# Patient Record
Sex: Male | Born: 1935 | Race: White | Hispanic: No | Smoking: Never smoker
Health system: Southern US, Community
[De-identification: ages and names within clinical notes are randomized; demographics above are authoritative.]

## PROBLEM LIST (undated history)

## (undated) DIAGNOSIS — I4891 Unspecified atrial fibrillation: Secondary | ICD-10-CM

## (undated) DIAGNOSIS — I639 Cerebral infarction, unspecified: Secondary | ICD-10-CM

## (undated) DIAGNOSIS — I671 Cerebral aneurysm, nonruptured: Secondary | ICD-10-CM

## (undated) DIAGNOSIS — I82409 Acute embolism and thrombosis of unspecified deep veins of unspecified lower extremity: Secondary | ICD-10-CM

## (undated) DIAGNOSIS — I739 Peripheral vascular disease, unspecified: Secondary | ICD-10-CM

## (undated) DIAGNOSIS — Z87442 Personal history of urinary calculi: Secondary | ICD-10-CM

## (undated) DIAGNOSIS — T8859XA Other complications of anesthesia, initial encounter: Secondary | ICD-10-CM

## (undated) DIAGNOSIS — I499 Cardiac arrhythmia, unspecified: Secondary | ICD-10-CM

## (undated) DIAGNOSIS — T4145XA Adverse effect of unspecified anesthetic, initial encounter: Secondary | ICD-10-CM

## (undated) DIAGNOSIS — M1990 Unspecified osteoarthritis, unspecified site: Secondary | ICD-10-CM

## (undated) DIAGNOSIS — H9190 Unspecified hearing loss, unspecified ear: Secondary | ICD-10-CM

## (undated) HISTORY — PX: PROSTATECTOMY: SHX69

## (undated) HISTORY — PX: HERNIA REPAIR: SHX51

## (undated) HISTORY — DX: Acute embolism and thrombosis of unspecified deep veins of unspecified lower extremity: I82.409

## (undated) HISTORY — DX: Peripheral vascular disease, unspecified: I73.9

## (undated) HISTORY — PX: ABOVE KNEE LEG AMPUTATION: SUR20

## (undated) HISTORY — PX: TONSILLECTOMY AND ADENOIDECTOMY: SHX28

## (undated) HISTORY — DX: Unspecified osteoarthritis, unspecified site: M19.90

## (undated) HISTORY — PX: CYST EXCISION: SHX5701

## (undated) HISTORY — DX: Unspecified atrial fibrillation: I48.91

## (undated) HISTORY — PX: CYSTOSCOPY: SUR368

## (undated) HISTORY — PX: APPENDECTOMY: SHX54

## (undated) HISTORY — PX: VARICOSE VEIN SURGERY: SHX832

## (undated) HISTORY — DX: Cerebral aneurysm, nonruptured: I67.1

---

## 1998-04-17 ENCOUNTER — Encounter: Payer: Self-pay | Admitting: Emergency Medicine

## 1998-04-17 ENCOUNTER — Emergency Department (HOSPITAL_COMMUNITY): Admission: EM | Admit: 1998-04-17 | Discharge: 1998-04-17 | Payer: Self-pay | Admitting: Emergency Medicine

## 2003-06-24 ENCOUNTER — Ambulatory Visit (HOSPITAL_COMMUNITY): Admission: RE | Admit: 2003-06-24 | Discharge: 2003-06-24 | Payer: Self-pay | Admitting: Family Medicine

## 2003-12-07 ENCOUNTER — Emergency Department (HOSPITAL_COMMUNITY): Admission: EM | Admit: 2003-12-07 | Discharge: 2003-12-07 | Payer: Self-pay | Admitting: Emergency Medicine

## 2004-04-08 ENCOUNTER — Emergency Department (HOSPITAL_COMMUNITY): Admission: EM | Admit: 2004-04-08 | Discharge: 2004-04-08 | Payer: Self-pay | Admitting: Emergency Medicine

## 2006-12-11 ENCOUNTER — Ambulatory Visit: Payer: Self-pay | Admitting: Vascular Surgery

## 2007-04-10 ENCOUNTER — Ambulatory Visit (HOSPITAL_BASED_OUTPATIENT_CLINIC_OR_DEPARTMENT_OTHER): Admission: RE | Admit: 2007-04-10 | Discharge: 2007-04-10 | Payer: Self-pay | Admitting: Urology

## 2008-08-26 ENCOUNTER — Encounter: Admission: RE | Admit: 2008-08-26 | Discharge: 2008-08-26 | Payer: Self-pay | Admitting: Sports Medicine

## 2008-09-08 ENCOUNTER — Ambulatory Visit: Payer: Self-pay | Admitting: Vascular Surgery

## 2008-09-09 ENCOUNTER — Ambulatory Visit: Payer: Self-pay | Admitting: Surgery

## 2008-09-09 ENCOUNTER — Ambulatory Visit (HOSPITAL_COMMUNITY): Admission: RE | Admit: 2008-09-09 | Discharge: 2008-09-09 | Payer: Self-pay | Admitting: Surgery

## 2008-09-15 ENCOUNTER — Ambulatory Visit: Payer: Self-pay | Admitting: Vascular Surgery

## 2008-09-28 ENCOUNTER — Inpatient Hospital Stay (HOSPITAL_COMMUNITY): Admission: RE | Admit: 2008-09-28 | Discharge: 2008-10-05 | Payer: Self-pay | Admitting: Vascular Surgery

## 2008-09-28 ENCOUNTER — Encounter: Payer: Self-pay | Admitting: Vascular Surgery

## 2008-09-29 ENCOUNTER — Ambulatory Visit: Payer: Self-pay | Admitting: Physical Medicine & Rehabilitation

## 2008-10-05 ENCOUNTER — Ambulatory Visit: Payer: Self-pay | Admitting: Physical Medicine & Rehabilitation

## 2008-10-05 ENCOUNTER — Inpatient Hospital Stay (HOSPITAL_COMMUNITY)
Admission: RE | Admit: 2008-10-05 | Discharge: 2008-10-19 | Payer: Self-pay | Admitting: Physical Medicine & Rehabilitation

## 2008-10-27 ENCOUNTER — Ambulatory Visit: Payer: Self-pay | Admitting: Vascular Surgery

## 2008-11-22 ENCOUNTER — Encounter
Admission: RE | Admit: 2008-11-22 | Discharge: 2008-11-30 | Payer: Self-pay | Admitting: Physical Medicine & Rehabilitation

## 2008-11-23 ENCOUNTER — Ambulatory Visit: Payer: Self-pay | Admitting: Physical Medicine & Rehabilitation

## 2009-01-14 ENCOUNTER — Encounter
Admission: RE | Admit: 2009-01-14 | Discharge: 2009-02-10 | Payer: Self-pay | Admitting: Physical Medicine & Rehabilitation

## 2009-02-14 ENCOUNTER — Encounter
Admission: RE | Admit: 2009-02-14 | Discharge: 2009-05-15 | Payer: Self-pay | Admitting: Physical Medicine & Rehabilitation

## 2009-04-13 ENCOUNTER — Ambulatory Visit: Payer: Self-pay | Admitting: Vascular Surgery

## 2009-05-19 ENCOUNTER — Encounter
Admission: RE | Admit: 2009-05-19 | Discharge: 2009-06-10 | Payer: Self-pay | Source: Home / Self Care | Admitting: Physical Medicine & Rehabilitation

## 2009-09-19 ENCOUNTER — Ambulatory Visit: Payer: Self-pay | Admitting: Cardiovascular Disease

## 2009-10-04 ENCOUNTER — Ambulatory Visit: Payer: Self-pay | Admitting: Cardiology

## 2009-10-14 ENCOUNTER — Encounter
Admission: RE | Admit: 2009-10-14 | Discharge: 2009-10-18 | Payer: Self-pay | Admitting: Physical Medicine & Rehabilitation

## 2009-10-18 ENCOUNTER — Ambulatory Visit: Payer: Self-pay | Admitting: Physical Medicine & Rehabilitation

## 2009-10-27 ENCOUNTER — Encounter
Admission: RE | Admit: 2009-10-27 | Discharge: 2009-12-26 | Payer: Self-pay | Admitting: Physical Medicine & Rehabilitation

## 2009-11-07 ENCOUNTER — Ambulatory Visit: Payer: Self-pay | Admitting: Cardiology

## 2009-11-22 ENCOUNTER — Ambulatory Visit: Payer: Self-pay | Admitting: Cardiology

## 2009-12-20 ENCOUNTER — Ambulatory Visit: Payer: Self-pay | Admitting: Cardiovascular Disease

## 2010-01-25 ENCOUNTER — Ambulatory Visit: Payer: Self-pay | Admitting: Cardiology

## 2010-02-27 ENCOUNTER — Ambulatory Visit: Payer: Self-pay | Admitting: Cardiology

## 2010-03-06 ENCOUNTER — Encounter: Payer: Self-pay | Admitting: Sports Medicine

## 2010-03-22 ENCOUNTER — Other Ambulatory Visit (INDEPENDENT_AMBULATORY_CARE_PROVIDER_SITE_OTHER): Payer: Medicare Other

## 2010-03-22 DIAGNOSIS — I4891 Unspecified atrial fibrillation: Secondary | ICD-10-CM

## 2010-03-22 DIAGNOSIS — Z7901 Long term (current) use of anticoagulants: Secondary | ICD-10-CM

## 2010-03-23 ENCOUNTER — Other Ambulatory Visit: Payer: Self-pay

## 2010-03-24 ENCOUNTER — Other Ambulatory Visit: Payer: Self-pay

## 2010-04-20 ENCOUNTER — Encounter (INDEPENDENT_AMBULATORY_CARE_PROVIDER_SITE_OTHER): Payer: Medicare Other

## 2010-04-20 DIAGNOSIS — I4891 Unspecified atrial fibrillation: Secondary | ICD-10-CM

## 2010-04-20 DIAGNOSIS — Z7901 Long term (current) use of anticoagulants: Secondary | ICD-10-CM

## 2010-05-19 LAB — PROTIME-INR
INR: 2.3 — ABNORMAL HIGH (ref 0.00–1.49)
INR: 2.3 — ABNORMAL HIGH (ref 0.00–1.49)
INR: 2.6 — ABNORMAL HIGH (ref 0.00–1.49)
INR: 2.6 — ABNORMAL HIGH (ref 0.00–1.49)
INR: 2.7 — ABNORMAL HIGH (ref 0.00–1.49)
INR: 2.7 — ABNORMAL HIGH (ref 0.00–1.49)
INR: 2.9 — ABNORMAL HIGH (ref 0.00–1.49)
Prothrombin Time: 25.2 seconds — ABNORMAL HIGH (ref 11.6–15.2)
Prothrombin Time: 25.4 seconds — ABNORMAL HIGH (ref 11.6–15.2)
Prothrombin Time: 27.3 seconds — ABNORMAL HIGH (ref 11.6–15.2)
Prothrombin Time: 28 seconds — ABNORMAL HIGH (ref 11.6–15.2)
Prothrombin Time: 28.4 seconds — ABNORMAL HIGH (ref 11.6–15.2)
Prothrombin Time: 28.7 seconds — ABNORMAL HIGH (ref 11.6–15.2)
Prothrombin Time: 30 seconds — ABNORMAL HIGH (ref 11.6–15.2)

## 2010-05-19 LAB — DIFFERENTIAL
Basophils Absolute: 0 10*3/uL (ref 0.0–0.1)
Basophils Relative: 1 % (ref 0–1)
Eosinophils Absolute: 0.3 10*3/uL (ref 0.0–0.7)
Eosinophils Relative: 5 % (ref 0–5)
Lymphocytes Relative: 26 % (ref 12–46)
Lymphs Abs: 1.8 10*3/uL (ref 0.7–4.0)
Monocytes Absolute: 0.8 10*3/uL (ref 0.1–1.0)
Monocytes Relative: 12 % (ref 3–12)
Neutro Abs: 3.9 10*3/uL (ref 1.7–7.7)
Neutrophils Relative %: 57 % (ref 43–77)

## 2010-05-19 LAB — CBC
HCT: 37.1 % — ABNORMAL LOW (ref 39.0–52.0)
Hemoglobin: 12.3 g/dL — ABNORMAL LOW (ref 13.0–17.0)
MCHC: 33.2 g/dL (ref 30.0–36.0)
MCV: 84.5 fL (ref 78.0–100.0)
Platelets: 208 10*3/uL (ref 150–400)
RBC: 4.39 MIL/uL (ref 4.22–5.81)
RDW: 14.4 % (ref 11.5–15.5)
WBC: 6.9 10*3/uL (ref 4.0–10.5)

## 2010-05-20 LAB — COMPREHENSIVE METABOLIC PANEL
ALT: 70 U/L — ABNORMAL HIGH (ref 0–53)
AST: 40 U/L — ABNORMAL HIGH (ref 0–37)
Albumin: 2.6 g/dL — ABNORMAL LOW (ref 3.5–5.2)
Alkaline Phosphatase: 58 U/L (ref 39–117)
BUN: 14 mg/dL (ref 6–23)
CO2: 31 mEq/L (ref 19–32)
Calcium: 8.6 mg/dL (ref 8.4–10.5)
Chloride: 100 mEq/L (ref 96–112)
Creatinine, Ser: 0.88 mg/dL (ref 0.4–1.5)
GFR calc Af Amer: >60 mL/min (ref >60)
GFR calc non Af Amer: >60 mL/min (ref >60)
Glucose, Bld: 112 mg/dL — ABNORMAL HIGH (ref 70–99)
Potassium: 4.8 mEq/L (ref 3.5–5.1)
Sodium: 135 mEq/L (ref 135–145)
Total Bilirubin: 0.6 mg/dL (ref 0.3–1.2)
Total Protein: 6.6 g/dL (ref 6.0–8.3)

## 2010-05-20 LAB — CBC
HCT: 33.7 % — ABNORMAL LOW (ref 39.0–52.0)
HCT: 35.2 % — ABNORMAL LOW (ref 39.0–52.0)
HCT: 35.9 % — ABNORMAL LOW (ref 39.0–52.0)
HCT: 37.8 % — ABNORMAL LOW (ref 39.0–52.0)
Hemoglobin: 11.4 g/dL — ABNORMAL LOW (ref 13.0–17.0)
Hemoglobin: 11.7 g/dL — ABNORMAL LOW (ref 13.0–17.0)
Hemoglobin: 12 g/dL — ABNORMAL LOW (ref 13.0–17.0)
Hemoglobin: 12.6 g/dL — ABNORMAL LOW (ref 13.0–17.0)
MCHC: 33.2 g/dL (ref 30.0–36.0)
MCHC: 33.3 g/dL (ref 30.0–36.0)
MCHC: 33.5 g/dL (ref 30.0–36.0)
MCHC: 33.8 g/dL (ref 30.0–36.0)
MCV: 83.7 fL (ref 78.0–100.0)
MCV: 83.8 fL (ref 78.0–100.0)
MCV: 83.8 fL (ref 78.0–100.0)
MCV: 84.5 fL (ref 78.0–100.0)
Platelets: 184 10*3/uL (ref 150–400)
Platelets: 194 10*3/uL (ref 150–400)
Platelets: 197 10*3/uL (ref 150–400)
Platelets: 270 10*3/uL (ref 150–400)
RBC: 4.03 MIL/uL — ABNORMAL LOW (ref 4.22–5.81)
RBC: 4.2 MIL/uL — ABNORMAL LOW (ref 4.22–5.81)
RBC: 4.25 MIL/uL (ref 4.22–5.81)
RBC: 4.51 MIL/uL (ref 4.22–5.81)
RDW: 13.5 % (ref 11.5–15.5)
RDW: 13.8 % (ref 11.5–15.5)
RDW: 14 % (ref 11.5–15.5)
RDW: 14.3 % (ref 11.5–15.5)
WBC: 8.3 10*3/uL (ref 4.0–10.5)
WBC: 9.3 10*3/uL (ref 4.0–10.5)
WBC: 9.4 10*3/uL (ref 4.0–10.5)
WBC: 9.8 10*3/uL (ref 4.0–10.5)

## 2010-05-20 LAB — PROTIME-INR
INR: 1.1 (ref 0.00–1.49)
INR: 1.1 (ref 0.00–1.49)
INR: 1.7 — ABNORMAL HIGH (ref 0.00–1.49)
INR: 2.3 — ABNORMAL HIGH (ref 0.00–1.49)
INR: 2.3 — ABNORMAL HIGH (ref 0.00–1.49)
INR: 2.6 — ABNORMAL HIGH (ref 0.00–1.49)
INR: 2.6 — ABNORMAL HIGH (ref 0.00–1.49)
INR: 2.6 — ABNORMAL HIGH (ref 0.00–1.49)
INR: 2.9 — ABNORMAL HIGH (ref 0.00–1.49)
INR: 3 — ABNORMAL HIGH (ref 0.00–1.49)
INR: 3.2 — ABNORMAL HIGH (ref 0.00–1.49)
INR: 3.3 — ABNORMAL HIGH (ref 0.00–1.49)
INR: 3.4 — ABNORMAL HIGH (ref 0.00–1.49)
Prothrombin Time: 14.1 seconds (ref 11.6–15.2)
Prothrombin Time: 14.3 seconds (ref 11.6–15.2)
Prothrombin Time: 20 seconds — ABNORMAL HIGH (ref 11.6–15.2)
Prothrombin Time: 24.8 seconds — ABNORMAL HIGH (ref 11.6–15.2)
Prothrombin Time: 25 seconds — ABNORMAL HIGH (ref 11.6–15.2)
Prothrombin Time: 27.3 seconds — ABNORMAL HIGH (ref 11.6–15.2)
Prothrombin Time: 27.6 seconds — ABNORMAL HIGH (ref 11.6–15.2)
Prothrombin Time: 27.8 seconds — ABNORMAL HIGH (ref 11.6–15.2)
Prothrombin Time: 30.2 seconds — ABNORMAL HIGH (ref 11.6–15.2)
Prothrombin Time: 30.9 seconds — ABNORMAL HIGH (ref 11.6–15.2)
Prothrombin Time: 32.2 seconds — ABNORMAL HIGH (ref 11.6–15.2)
Prothrombin Time: 33.4 seconds — ABNORMAL HIGH (ref 11.6–15.2)
Prothrombin Time: 34.4 seconds — ABNORMAL HIGH (ref 11.6–15.2)

## 2010-05-20 LAB — BASIC METABOLIC PANEL
BUN: 10 mg/dL (ref 6–23)
BUN: 11 mg/dL (ref 6–23)
BUN: 12 mg/dL (ref 6–23)
CO2: 25 mEq/L (ref 19–32)
CO2: 29 mEq/L (ref 19–32)
CO2: 30 mEq/L (ref 19–32)
Calcium: 8.2 mg/dL — ABNORMAL LOW (ref 8.4–10.5)
Calcium: 8.3 mg/dL — ABNORMAL LOW (ref 8.4–10.5)
Calcium: 8.5 mg/dL (ref 8.4–10.5)
Chloride: 102 mEq/L (ref 96–112)
Chloride: 106 mEq/L (ref 96–112)
Chloride: 108 mEq/L (ref 96–112)
Creatinine, Ser: 0.76 mg/dL (ref 0.4–1.5)
Creatinine, Ser: 0.83 mg/dL (ref 0.4–1.5)
Creatinine, Ser: 0.96 mg/dL (ref 0.4–1.5)
GFR calc Af Amer: >60 mL/min (ref >60)
GFR calc Af Amer: >60 mL/min (ref >60)
GFR calc Af Amer: >60 mL/min (ref >60)
GFR calc non Af Amer: >60 mL/min (ref >60)
GFR calc non Af Amer: >60 mL/min (ref >60)
GFR calc non Af Amer: >60 mL/min (ref >60)
Glucose, Bld: 112 mg/dL — ABNORMAL HIGH (ref 70–99)
Glucose, Bld: 134 mg/dL — ABNORMAL HIGH (ref 70–99)
Glucose, Bld: 139 mg/dL — ABNORMAL HIGH (ref 70–99)
Potassium: 3.5 mEq/L (ref 3.5–5.1)
Potassium: 3.7 mEq/L (ref 3.5–5.1)
Potassium: 4 mEq/L (ref 3.5–5.1)
Sodium: 137 mEq/L (ref 135–145)
Sodium: 139 mEq/L (ref 135–145)
Sodium: 139 mEq/L (ref 135–145)

## 2010-05-20 LAB — URINALYSIS, MICROSCOPIC ONLY
Bilirubin Urine: NEGATIVE
Glucose, UA: NEGATIVE mg/dL
Ketones, ur: 15 mg/dL — AB
Leukocytes, UA: NEGATIVE
Nitrite: NEGATIVE
Protein, ur: 30 mg/dL — AB
Specific Gravity, Urine: 1.018 (ref 1.005–1.030)
Urobilinogen, UA: 1 mg/dL (ref 0.0–1.0)
pH: 6 (ref 5.0–8.0)

## 2010-05-20 LAB — URINALYSIS, ROUTINE W REFLEX MICROSCOPIC
Bilirubin Urine: NEGATIVE
Glucose, UA: NEGATIVE mg/dL
Ketones, ur: NEGATIVE mg/dL
Leukocytes, UA: NEGATIVE
Nitrite: NEGATIVE
Protein, ur: NEGATIVE mg/dL
Specific Gravity, Urine: 1.025 (ref 1.005–1.030)
Urobilinogen, UA: 0.2 mg/dL (ref 0.0–1.0)
pH: 5.5 (ref 5.0–8.0)

## 2010-05-20 LAB — DIFFERENTIAL
Basophils Absolute: 0 10*3/uL (ref 0.0–0.1)
Basophils Relative: 0 % (ref 0–1)
Eosinophils Absolute: 0.2 10*3/uL (ref 0.0–0.7)
Eosinophils Relative: 2 % (ref 0–5)
Lymphocytes Relative: 18 % (ref 12–46)
Lymphs Abs: 1.5 10*3/uL (ref 0.7–4.0)
Monocytes Absolute: 0.8 10*3/uL (ref 0.1–1.0)
Monocytes Relative: 10 % (ref 3–12)
Neutro Abs: 5.7 10*3/uL (ref 1.7–7.7)
Neutrophils Relative %: 69 % (ref 43–77)

## 2010-05-20 LAB — BRAIN NATRIURETIC PEPTIDE: Pro B Natriuretic peptide (BNP): 375 pg/mL — ABNORMAL HIGH (ref 0.0–100.0)

## 2010-05-20 LAB — CARDIAC PANEL(CRET KIN+CKTOT+MB+TROPI)
CK, MB: 2.3 ng/mL (ref 0.3–4.0)
Relative Index: 0.5 (ref 0.0–2.5)
Total CK: 451 U/L — ABNORMAL HIGH (ref 7–232)
Troponin I: 0.03 ng/mL (ref 0.00–0.06)

## 2010-05-20 LAB — URINE CULTURE
Colony Count: 1000
Colony Count: 4000
Special Requests: POSITIVE

## 2010-05-20 LAB — HEMOGLOBIN A1C
Hgb A1c MFr Bld: 5.9 % (ref 4.6–6.1)
Mean Plasma Glucose: 123 mg/dL

## 2010-05-20 LAB — URINE MICROSCOPIC-ADD ON

## 2010-05-20 LAB — POCT I-STAT 4, (NA,K, GLUC, HGB,HCT)
Glucose, Bld: 114 mg/dL — ABNORMAL HIGH (ref 70–99)
HCT: 43 % (ref 39.0–52.0)
Hemoglobin: 14.6 g/dL (ref 13.0–17.0)
Potassium: 4.5 mEq/L (ref 3.5–5.1)
Sodium: 141 mEq/L (ref 135–145)

## 2010-05-20 LAB — TSH: TSH: 1.108 u[IU]/mL (ref 0.350–4.500)

## 2010-05-20 LAB — CK ISOENZYMES
CK-MB: 0 % (ref <5)
CK-MM: 100 % (ref 95–100)
Creatine Kinase-Total: 259 U/L — ABNORMAL HIGH (ref 44–196)

## 2010-05-20 LAB — TROPONIN I: Troponin I: 0.02 ng/mL (ref 0.00–0.06)

## 2010-05-20 LAB — GLUCOSE, CAPILLARY: Glucose-Capillary: 112 mg/dL — ABNORMAL HIGH (ref 70–99)

## 2010-05-21 LAB — POCT I-STAT, CHEM 8
BUN: 25 mg/dL — ABNORMAL HIGH (ref 6–23)
Calcium, Ion: 1.11 mmol/L — ABNORMAL LOW (ref 1.12–1.32)
Chloride: 107 mEq/L (ref 96–112)
Creatinine, Ser: 1 mg/dL (ref 0.4–1.5)
Glucose, Bld: 109 mg/dL — ABNORMAL HIGH (ref 70–99)
HCT: 41 % (ref 39.0–52.0)
Hemoglobin: 13.9 g/dL (ref 13.0–17.0)
Potassium: 4 mEq/L (ref 3.5–5.1)
Sodium: 141 mEq/L (ref 135–145)
TCO2: 26 mmol/L (ref 0–100)

## 2010-05-24 ENCOUNTER — Ambulatory Visit: Payer: Self-pay | Admitting: *Deleted

## 2010-05-24 ENCOUNTER — Encounter: Payer: Medicare Other | Admitting: *Deleted

## 2010-06-14 ENCOUNTER — Other Ambulatory Visit: Payer: Self-pay | Admitting: *Deleted

## 2010-06-14 DIAGNOSIS — I4891 Unspecified atrial fibrillation: Secondary | ICD-10-CM

## 2010-06-19 ENCOUNTER — Ambulatory Visit (INDEPENDENT_AMBULATORY_CARE_PROVIDER_SITE_OTHER): Payer: Medicare Other | Admitting: *Deleted

## 2010-06-19 DIAGNOSIS — I4891 Unspecified atrial fibrillation: Secondary | ICD-10-CM | POA: Insufficient documentation

## 2010-06-19 DIAGNOSIS — Z7901 Long term (current) use of anticoagulants: Secondary | ICD-10-CM | POA: Insufficient documentation

## 2010-06-19 LAB — POCT INR: INR: 2.6

## 2010-06-27 NOTE — Discharge Summary (Signed)
NAMEYOBANI, SCHERTZER NO.:  [id number]   MEDICAL RECORD NO.:  [id number]          PATIENT TYPE:  INP   LOCATION:  2033                         FACILITY:  [facility]   PHYSICIAN:  Janetta Hora. Fields, MD  DATE OF BIRTH:  [birth year]   DATE OF ADMISSION:  09/28/2008  DATE OF DISCHARGE:                               DISCHARGE SUMMARY   ADMISSION DIAGNOSES:  1. Nonhealing wound, left foot.  2. Abnormal chest x-ray showing pleural-based nodular density in the      right apex measuring 8 mm.   FINAL DISCHARGE DIAGNOSES:  1. Nonhealing wound, left foot status post left above-knee amputation.  2. Postoperative atrial fibrillation, recurrent and with previous      history of atrial fibrillation in the past.  3. Constipation.  4. History of urethral stricture status post balloon dilatation in      February 2009 by Dr. Jethro Bolus.  5. History of ruptured brain aneurysm.  6. History of laparotomy for ruptured appendix.  7. History of tonsillectomy.  8. History of left femoral deep venous thrombosis.  9. History of bilateral lower extremity vein stripping.  10.History of venous insufficiency.  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  12.History of abnormal chest x-ray but CT scan on September 29, 2008      showed no active cardiopulmonary abnormalities and evidence of left      upper lobe scarring.   PROCEDURES:  September 28, 2008, he underwent left above-knee amputation by  Dr. Fabienne Bruns.   CONSULTATIONS:  1. Physical Therapy.  2. Rehab Medicine.  3. Occupational Therapy.  4. Rosine Abe, MD, [facility].   BRIEF HISTORY:  Mr. Tomaro is an 77 year old Caucasian male who has been  followed by Dr. Fabienne Bruns for venous stasis ulcer and was also  found to have nonreconstructable tibial occlusive disease by arteriogram  done by Dr. Myra Gianotti on September 09, 2008.  He had a nonhealing left lower  extremity  wound involving his left foot.  Erythema extended to his mid  calf in the left leg.  He ultimately felt he would require left above-  knee amputation due to the changes of erythema and edema in his left  calf that would make wound healing difficult.  The patient was agreed to  proceed.   HOSPITAL COURSE:  Mr. Sellick was admitted to [facility] on  September 28, 2008.  He underwent the previously mentioned procedure.  Postoperatively, he was transferred to Telemetry Unit 2000.  Within a  few hours of surgery, he developed a rapid atrial fibrillation with a  rate in the 110s-140s.  He denied chest pain or shortness of breath.  He  had reported previous treatment for atrial fibrillation but had not seen  a cardiologist in years.  His wife sees Dr. Peter Swaziland.  Dr. Reyes Ivan  from [facility] was on-call and we notified him and began a  Cardizem drip.  He did have a set of cardiac enzymes which showed a  negative troponin.  BNP on  September 29, 2008 was little elevated at 375  and hemoglobin A1c was normal at 5.9.  Initially, he converted on a  Cardizem drip and was started on oral Cardizem, however, he ultimately  had recurrent atrial fibrillation and required amiodarone.  A decision  was also made to start him on Coumadin due to refractory atrial  fibrillation.  Over the last 48 hours or more, he has been maintaining  sinus rhythm on amiodarone alone.  His INR is now therapeutic as well.  From a surgical standpoint, his left above-knee amputation site is  healing well.  There is a small amount of serous drainage.  The incision  is intact.  There was minimal erythema along the incision.  We have been  cleaning this with Betadine and placed a new dressing daily.  This is  being monitored but at this point we do not feel antibiotic therapy is  warranted.  We will continue to monitor his wound closely and treat as  appropriately.  He did have postoperative intermittent fever of  101.4,  it was felt probably most likely to atelectasis.  We did check  urinalysis.  Urine culture showed insignificant growth.  He did have an  area of his right forearm from what appeared to be phlebitis, probably  from IV Amiodarone which could have also been the culprit.  This has  remained stable and at the time of dictation he has been afebrile.  His  most recent labs show a INR of 2.3.  White count 9.3, hemoglobin 12,  hematocrit 35.9, and platelet count 184.  Normal TSH of 1.108.  Sodium  137, potassium 3.7, glucose 112, BUN 12, and creatinine 0.83.  Of note,  he did have a chest CT this admission for a question of a pleural-based  nodular density in the right apex.  The CT scan showed no active  cardiopulmonary abnormalities and left upper lobe scarring.   Physical Therapy and Occupational Therapy have been working with Mr.  Sharpley on abilities.  They are recommending inpatient rehab.  The  Medicine has evaluated the patient and their initial feeling is that he  should be an adequate candidate.  We are currently waiting reevaluation  and if they still feel he is a good candidate and beds are available, we  feel that he could be ready for discharge to [facility] as early as October 04, 2008.  Currently, he remains in  stable and improving condition.   DISCHARGE MEDICATIONS:  1. Amiodarone 200 mg p.o. daily.  His cardiologist felt he should be      on this at least for 1 month.  2. Coumadin.  Final Coumadin dose will be determined at the time of      discharge.  3. Colace 100 mg p.o. daily.  He may hold for loose stools.  4. Oxycodone 5 mg 1-2 tablets p.o. q.4 h.   DISCHARGE INSTRUCTIONS:  Please clean his amputation site daily with  soap and water or paint with Betadine daily and place a Nu-Gauze  dressing and Ace wrap.  We will continue to periodically follow his  progress in rehab but please notify us if there is purulent drainage,  suppuration at  the incision site, or increased erythema.  His staples  will stay in 3-4 weeks following surgery.  Our office will arrange a  followup appointment with Dr. Darrick Penna.  He will need to follow up with  Dr. Reyes Ivan and associates at [facility]  after discharge for  further monitoring or  evaluation of his atrial fibrillation.  He will need to call and  schedule this prior to being discharged home and he will also need  instructions for his PT/INR followup prior to discharge from rehab.  He  should continue PT/OT as appropriate and continue heart-healthy diet.      Jerold Coombe, P.A.      Janetta Hora. Fields, MD  Electronically Signed    AWZ/MEDQ  D:  10/03/2008  T:  10/04/2008  Job:  [id number]   cc:   Elmore Guise., M.D.  Sigmund I. Patsi Sears, M.D.  Leonides Grills, M.D.  Robert A. Nicholos Johns, M.D.

## 2010-06-27 NOTE — Assessment & Plan Note (Signed)
OFFICE VISIT   [name] [name], [name] [name]  DOB:  [birth year]                                       12/11/2006  ZOXWR#:[id number]   Patient has been followed long-term for venous insufficiency and  previous ulcerations in his legs.  He was last seen in October, 2007.  He returns today for followup.  He has had no further wound breakdown or  ulcerations on his legs since he was last seen.  He has been very  compliant with his thigh-high compression stockings and states that they  still have good elasticity at this point.   PHYSICAL EXAMINATION:  He has some brawny staining of the left medial  gaiter area.  He has a 6 x 2 cm eschar on the left medial calf in the  gaiter area with no open skin breakdown.  He has some valgus deformity  of his left ankle, which is at baseline.  He has no ulcerations on the  right foot.  Both feet are pink, warm, and adequately perfused.  He has  2+ femoral, 1+ popliteal, and 1+ posterior tibial pulses bilaterally.  He does have some dry skin on the left and right legs, and I counseled  him on placing some moisturizing lotion on this to prevent further skin  breakdown.   He will follow up in one year's time and continue to wear his  compression stockings.   Janetta Hora. Fields, MD  Electronically Signed   CEF/MEDQ  D:  12/11/2006  T:  12/12/2006  Job:  [id number]   cc:   Molly Maduro A. Nicholos Johns, M.D.

## 2010-06-27 NOTE — Assessment & Plan Note (Signed)
OFFICE VISIT   [name] [name], [name] [name]  DOB:  [birth year]                                       10/27/2008  ZOXWR#:[id number]   The patient returns for followup today.  He underwent a left above knee  amputation on 08/17.  On exam today the incision is healing well.  Staples were removed today.  A shrinker was prescribed.  He will need  followup ABIs and a return visit with me in 6 months' time for his  contralateral leg.  Hopefully we will be able to fit him for a  prosthetic for his left leg in the near future.   Janetta Hora. Fields, MD  Electronically Signed   CEF/MEDQ  D:  10/27/2008  T:  10/28/2008  Job:  [id number]   cc:   Leonides Grills, M.D.  Robert A. Nicholos Johns, M.D.

## 2010-06-27 NOTE — Assessment & Plan Note (Signed)
OFFICE VISIT   [name] [name], [name] [name]  DOB:  01/24/1927                                       09/15/2008  [id number]#:[id number]   The patient returns for follow-up today.  He had an arteriogram  performed by Dr. Myra Gianotti on September 09, 2008.  Unfortunately, this showed  unreconstructable tibial artery occlusive disease in his left lower  extremity.   On physical exam today, blood pressure is 133/78 in the left arm, pulse  is 84 and regular.  Left lower extremity still has erythema throughout  the entire left foot with some edema and nonhealing ulcerations between  the toes.  The erythema extends up to the mid calf in the left leg.   I believe the only option for the patient at this point would be a left  above-knee amputation.  I do not believe he will heal up a left below-  knee amputation as he has changes of erythema and edema in the left calf  that would make wound healing difficult as well as the fact that his  popliteal artery is occluded.  A left above-knee amputation is scheduled  for September 28, 2008.  The risks, benefits, possible complications,  procedure details were explained to the patient and his family today  including, but not limited to, DVT, infection, nonhealing wound,  anesthetic risks.  They understand and agree to proceed.   Janetta Hora. Fields, MD  Electronically Signed   CEF/MEDQ  D:  09/15/2008  T:  09/16/2008  Job:  [id number]   cc:   Leonides Grills, M.D.  Robert A. Nicholos Johns, M.D.

## 2010-06-27 NOTE — Op Note (Signed)
NAMEMIKAI, MEINTS NO.:  [id number]   MEDICAL RECORD NO.:  [id number]          PATIENT TYPE:  AMB   LOCATION:  [facility]                         FACILITY:  [facility]   PHYSICIAN:  [name] [name], M.D.DATE OF BIRTH:  Apr 11, 1926   DATE OF PROCEDURE:  04/10/2007  DATE OF DISCHARGE:                               OPERATIVE REPORT   PREOPERATIVE DIAGNOSIS:  Urethral stricture.   POSTOPERATIVE DIAGNOSIS:  Urethral stricture.   PROCEDURE:  1. Cystoscopy.  2. Balloon dilatation.  3. Placement of urethral catheter.   SURGEON:  [name] [name], M.D.   ASSISTANT:  [name] [name], M.D.   ANESTHESIA:  General.   DRAINS:  20 French council tip catheter.   INDICATIONS FOR PROCEDURE:  The patient is a 77 year old gentleman who  was evaluated by Dr. Patsi [name] in clinic for complaints of duplicated  stream.  The patient in the past has had history of TURP and urethral  stricture, status post urethral dilatation.  The patient was given a  trial of placement of Foley catheter, however, there was difficulty in  placing the Foley catheter, so the patient was scheduled for cystoscopy,  balloon dilatation of urethral stricture.   DESCRIPTION OF PROCEDURE:  After obtaining informed consent in the  holding area, the patient was brought to the operating room, was placed  in the supine position.  Administered general anesthesia by the  anesthesia team.  The patient was subsequently placed in the dorsal  lithotomy position.  Appropriate preop medications performed and given  to the patient, procedure, and site.  The patient was given appropriate  preoperative antibiotic.  The patient was subsequently prepped and  draped in the usual sterile fashion.  We then attempted to do  cystourethroscopy with a 22 French sheath and a 12 degree  lens.  Upon  entering the patient's urethra just a couple cm through the urethral  meatus, we noticed an area of stricture and we were not  able to  negotiate the scope through it.  We then under cystoscopic vision passed  a sensor guide wire into the patient's bladder and removed the  cystoscope.  We then confirmed the presence of wire in the bladder with  the help of fluoroscopy.  We then passed 24 French nephrostomy balloon  dilator over the wire into the bladder and again confirmed the placement  of the balloon within the urethra under fluoroscopy.  We then inflated  the balloon to 12 atmospheres of pressure and kept it in place for 3  minutes.  We then deflated the balloon and removed the balloon dilator  and left the guide wire in place and performed cystourethroscopy by the  site of the wire.  At this time we were able to go through the patient's  urethra into the patient's bladder with the help of 22 French  cystoscopic sheath the area of narrowing had seamed dilated by now and  there were no other areas of strictures seen along the anterior urethra.  Along the posterior urethra, the patient's bladder neck appeared wide  open.  Verumontanum was in place  and the cystoscopy revealed the  ureteral orifices were normal anatomic position with 1-2+  trabeculations.  No other mass, lesion, or stone were seen.  At that  point, we removed the cystoscope and placed a 20 [place] council tip  catheter over the wire into the patient's bladder.  Clear urine was seen  effluxing.  The guide wire was removed.  This marked the end of the  procedure.  Of note, Dr. Patsi [name] was present and available for all  aspects of the case.  The patient was subsequently extubated and  transferred in stable condition to the recovery room.     ______________________________  [name] [name], M.D.      [name] [name], M.D.  Electronically Signed    [name]  D:  04/10/2007  T:  04/10/2007  Job:  [id number]   cc:   Lynelle Smoke I. Patsi [name], M.D.  Fax: [phone]   [name] [name], M.D.

## 2010-06-27 NOTE — H&P (Signed)
NAMEDARRIEN, BELTER NO.:  [id number]   MEDICAL RECORD NO.:  [id number]          PATIENT TYPE:  IPS   LOCATION:  4010                         FACILITY:  [facility]   PHYSICIAN:  Ranelle Oyster, M.D.DATE OF BIRTH:  1926-07-29   DATE OF ADMISSION:  10/05/2008  DATE OF DISCHARGE:                              HISTORY & PHYSICAL   PRIMARY CARE Mikaiah Stoffer:  Elana Alm. Nicholos Johns, MD   VASCULAR SURGEON:  Janetta Hora. Fields, MD   CHIEF COMPLAINT:  Left leg pain.   HISTORY OF PRESENT ILLNESS:  This is a pleasant 77 year old white male  with a nonhealing left foot ulcer, who was admitted on September 28, 2008,  for a left AKA by Dr. Darrick Penna.  He had postoperative AFib with rapid  ventricular response on September 29, 2008.  Cardiology was consulted for  input, recommended amiodarone and Coumadin.  He has been converted to  normal sinus rhythm with 1 month of amiodarone and Coumadin further  recommended.  The patient was seen by the rehab team on September 29, 2008,  and it was felt that he could potentially benefit from admission.  We  followed along, and ultimately, they brought him for an admission today.   REVIEW OF SYSTEMS:  Notable for improved left leg pain.  He has some  left leg swelling.  He denies any chest pain or shortness of breath.  Bowel and bladder functions have been normal.  Full review is in the  written H and P.   PAST MEDICAL HISTORY:  Positive for:  1. Vein stripping.  2. Appendectomy.  3. T and A.  4. Urethral stricture with dilatation.  5. DJD.  6. Ruptured brain aneurysm.  7. Cerebral hemorrhage 25 years ago.  8. Prostatectomy.  9. Left foot cellulitis, times several months.  10.History of paroxysmal atrial fibrillation 30 years ago.   FAMILY HISTORY:  Positive for CAD.   SOCIAL HISTORY:  The patient is married, lives in 2-level house with 1  plus 1 steps to enter.  He has a bedroom on the first floor.  He quit  smoking years ago and occasionally drinks  beer and wine.   ALLERGIES:  None.   HOME MEDICATION:  Levaquin.   LABORATORY DATA:  Hemoglobin 12.0, white count 9.3, platelets 184.  Sodium 137, potassium 3.7, BUN 12, creatinine 0.83.   PHYSICAL EXAMINATION:  VITAL SIGNS:  Blood pressure is 114/64, pulse 75,  respiratory rate 18, temperature 97.8.  GENERAL:  The patient is pleasant, alert, and oriented x3.  HEENT:  Pupils equally round and reactive to light.  Ear, nose, and  throat exam is unremarkable with fair dentition and pink moist mucosa.  NECK:  Supple without JVD or lymphadenopathy.  CHEST:  Clear to auscultation bilaterally without wheezes, rales, or  rhonchi.  HEART:  Regular rate and rhythm without murmur, rubs, or gallops.  ABDOMEN:  Soft, nontender.  Bowel sounds are positive.  EXTREMITIES:  Left leg is notable for intact fish-mouth incision with  minimal erythema.  No drainage was seen.  Area was appropriately tender  and mildly  swollen.  Right lower extremity showed chronic vascular  changes, and foot was slightly cool with pulses minimally palpable.  NEUROLOGIC:  Cranial nerves II through XII are intact.  Reflexes are 1+  and sensation is grossly within normal limits, perhaps a bit decreased  in distal right lower extremity.  Judgment, orientation, memory, and  mood were all functional.  Strength is 5/5 in the upper extremities,  right lower extremity is 5/5 proximal and 4/5 distally, left proximal  leg, he is 3/5.   POST ADMISSION PHYSICIAN EVALUATION:  1. Functional deficit secondary to PVD with nonhealing left foot ulcer      and ultimate left above-knee amputation, postoperative day #7.  2. The patient is admitted to receive collaborative interdisciplinary      care between the physiatrist, rehab nursing staff, and therapy      team.  3. The patient's level of medical complexity and substantial therapy      needs in context of that medical necessity cannot be provided at a      lesser intensity of care.   4. The patient has experienced substantial functional loss from his      baseline.  Upon functional assessment at the time of preadmission      screening, the patient had not been evaluated.  As of last 24      hours, the patient is +2 total assistance 60% for sit to stand      transfers.  He is mod assist to stand and sit and he is min assist      ambulating 12 feet with a rolling walker.  He is set up upper body      care and min assist lower body care.  The patient was independent      prior to arrival.  Judging by the patient's diagnosis, physical      exam, and functional history, he has a potential for functional      progress which will result in measurable gains while in inpatient      rehab.  These gains will be of substantial and practical use upon      discharge to home in facilitating mobility and self-care.  Interim      changes in medical status since preadmission screening are detailed      above.  5. Physiatrist will provide 24-hour management of medical needs, as      well as oversight of therapy plan/treatment, and provide guidance      as appropriate regarding interaction of the 2.  Medical problem      list and plan are below.  6. A 24-hour rehab nursing team will assist in the management of the      patient's wound care needs, as well as pain management, bowel and      bladder function, safety awareness, and integration of therapy      concept, techniques, education, etc.  7. PT will assess and treat for lower extremity strength, range of      motion, short distance ambulation, wheelchair mobility,      preprosthetic training and education with goals modified      independent.  8. OT will assess and treat for upper extremity use and ADLs, as well      as adaptive techniques, equipment, safety, and the patient/family      education with goals modified independent.  9. Case management and social worker will assess and treat for      psychosocial issues and  discharge  planning.  10.Team conference will be held weekly to assess progress towards      goals and to determine barriers at discharge.  11.The patient has demonstrated sufficient medical stability and      exercise capacity to tolerate at least 3 hours of therapy per day      at least 5 days per week.  12.Estimated length of stay is 7 days plus.  Prognosis is good.   MEDICAL PROBLEM LIST AND PLAN:  1. Pain management with oxycodone and Tylenol p.r.n.  Consider      scheduling based on tolerance of activities.  2. Paroxysmal atrial fibrillation:  The patient is in normal sinus      rhythm.  Continue amiodarone and Coumadin per Cardiology      recommendations.  3. Wound care:  Dry dressing and ACE wrap for now.  We will apply      stump shrinker after wound matures a bit more.  4. History of urethral stricture:  We will watch voiding patterns,      check PVRs as appropriate.      Ranelle Oyster, M.D.  Electronically Signed     ZTS/MEDQ  D:  10/05/2008  T:  10/06/2008  Job:  [id number]   cc:   Molly Maduro A. Nicholos Johns, M.D.  Janetta Hora. Darrick Penna, MD

## 2010-06-27 NOTE — Assessment & Plan Note (Signed)
OFFICE VISIT   [name] [name], [name] [name]  DOB:  Jun 20, 1926                                       04/13/2009  ZOXWR#:[id number]   The patient was seen today for evaluation of his lower extremities after  recent ABI showed that his perfusion to his right lower extremity had  decreased somewhat.  He has no complaints currently.  He is still  working on his balance for walking on his prosthetic for a previous left  above-knee amputation.  He denies any numbness or tingling in his right  foot.  He has no claudication symptoms.  He does have a history of  atrial fibrillation and is currently on Coumadin for this.   He was seen in the office today after recent ABIs showed the right-sided  ABI had gone from 0.59 to 0.36.  Waveforms were monophasic in character.   PHYSICAL EXAMINATION:  Today blood pressure is 160/75 in the left arm,  heart rate is 60 and regular, respirations 22.  Lower extremities:  He  has 2+ femoral pulses bilaterally.  He has a well-healed left above-knee  amputation.  He has absent popliteal and pedal pulses in the right leg.  He has no ulcerations on the feet.  He has some venous staining of the  skin on the medial aspect of his right leg.   I had a lengthy discussion with this patient  today informing him that  the perfusion to his right leg was lower than on his previous ABIs.  However, he currently is asymptomatic from this.  If he develops any  claudication symptoms or rest pain or early symptoms of tissue loss we  would need to consider a repeat arteriogram.  I believe he warrants  close follow-up due to his decreased perfusion and the fact that he only  has 1 remaining leg.  I will see him again with repeat ABIs in 1 months'  time.  He will return sooner if he develops rest pain or a nonhealing  wound of his foot.     Janetta Hora. Fields, MD  Electronically Signed   CEF/MEDQ  D:  04/13/2009  T:  04/14/2009  Job:  [id number]   cc:   Molly Maduro A. Nicholos Johns,  M.D.

## 2010-06-27 NOTE — Op Note (Signed)
NAMEJACLYN, [name] [name]                 ACCOUNT NO.:  [id number]   MEDICAL RECORD NO.:  [id number]          PATIENT TYPE:  AMB   LOCATION:  SDS                          FACILITY:  [facility]   PHYSICIAN:  Juleen China IV, MDDATE OF BIRTH:  26-Nov-1926   DATE OF PROCEDURE:  09/09/2008  DATE OF DISCHARGE:                               OPERATIVE REPORT   PREOPERATIVE DIAGNOSIS:  Left leg ulcer.   POSTOPERATIVE DIAGNOSIS:  Left leg ulcer.   PROCEDURES PERFORMED:  1. Ultrasound access right common femoral artery.  2. Abdominal aortogram.  3. Left lower extremity runoff.  4. Third order catheterization.   INDICATIONS:  [name] is an 77 year old gentleman with left foot  ulcer.  He comes today for today for arteriogram and possible  intervention.   PROCEDURE:  The patient was identified in the holding area and taken to  Room 8, he was placed supine on the table.  The right groin was prepped  and draped in standard sterile fashion.  Time-out was called.  Right  femoral artery was evaluated with ultrasound and found to be widely  patent.  Lidocaine 1% was used for local anesthesia.  The right common  femoral artery was accessed with an 18-gauge needle under ultrasound  guidance, an 0.035 wire was advanced into the aorta.  Under fluoroscopic  visualization, a 5-French sheath was placed.  Over the wire, Omni flush  catheter was advanced to the level L1, abdominal aortogram was obtained.  The catheter was then pulled down to the aortic bifurcation and pelvic  angiogram was obtained.  Next, using an Omni flush catheter, a 4-French  end-hole catheter and a Glidewire, the aortic bifurcation was crossed.  The catheter was placed in the left external iliac artery and left leg  runoff was obtained.   FINDINGS:  Aortogram:  The visualized portions of the suprarenal  abdominal aorta showed minimal disease.  There were single renal  arteries bilaterally which were widely patent.  There is mild  disease  within the infrarenal abdominal aorta without significant stenosis.   Pelvic Angiogram:  The right common iliac artery is widely patent.  The  right external iliac artery is widely patent.  The right hypogastric  artery is widely patent.  The left common iliac artery is widely patent.  The left external iliac artery is widely patent.  The left hypogastric  artery is widely patent.   Left Lower Extremity:  The left common femoral artery is widely patent  without significant disease.  The left profunda femoral artery is widely  patent, there is mild disease within the proximal superficial femoral  artery.  In the mid leg, the superficial femoral artery occludes, the  popliteal artery is occluded.  There is reconstitution of the diseased  anterior tibial artery at the level of the ankle which then occludes as  it goes down on the foot.  Plantar vessels are patent below the ankle.   After these images were obtained, decision was made to terminate the  procedure as no percutaneous options for revascularization were  possible.  The patient was  taken in the holding area for sheath pull.   IMPRESSION:  1. Minimal aortoiliac disease.  2. Occluded left superficial femoral artery with no deemed vessels      that reconstitutes down to the ankle.  No percutaneous options for      revascularization.      [name], MD  Electronically Signed     VWB/MEDQ  D:  09/09/2008  T:  09/09/2008  Job:  [id number]

## 2010-06-27 NOTE — Assessment & Plan Note (Signed)
OFFICE VISIT   [name] [name], [name] [name]  DOB:  December 06, 1926                                       09/08/2008  EAVWU#:[id number]   The patient was evaluated today for ulcerations on his left foot.  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  This is most likely superficial femoral artery and tibial artery  occlusive disease.  He also Charcot changes to the left foot.  Full  history and physical was dictated on the [facility] system.  He will have an  arteriogram by Dr. Myra Gianotti on September 09, 2008.   Janetta Hora. Fields, MD  Electronically Signed   CEF/MEDQ  D:  09/08/2008  T:  09/09/2008  Job:  [id number]

## 2010-06-27 NOTE — Op Note (Signed)
[name] [name], [name] [name] NO.:  [id number]   MEDICAL RECORD NO.:  [id number]          PATIENT TYPE:  INP   LOCATION:  2033                         FACILITY:  [facility]   PHYSICIAN:  Janetta Hora. Fields, MD  DATE OF BIRTH:  10/09/1926   DATE OF PROCEDURE:  09/28/2008  DATE OF DISCHARGE:                               OPERATIVE REPORT   PROCEDURE:  Left above-knee amputation.   PREOPERATIVE DIAGNOSIS:  Nonhealing wound, left foot.   POSTOPERATIVE DIAGNOSIS:  Nonhealing wound, left foot.   ANESTHESIA:  General.   ASSISTANT:  Jerold Coombe, PA-C   OPERATIVE FINDINGS:  Well-perfused left thigh musculature.   SPECIMENS:  Left leg.   OPERATIVE DETAILS:  After obtaining informed consent, the patient was  taken to the operating room.  The patient was placed in the supine  position on the operating table.  After induction of general anesthesia  and endotracheal intubation, Foley catheter was placed.  The patient's  entire left lower extremity was prepped and draped in usual sterile  fashion.  Circumferential incision was made on the left leg just above  the knee.  Incision was carried down through the subcutaneous tissues  and the muscle was divided with cautery.  Superficial femoral artery and  superficial femoral veins were dissected free circumferentially and  these were both ligated divided and suture ligated proximally.  Sciatic  nerve was dissected free circumferentially and pulled down the operative  field.  It was then ligated high, divided, and allowed to retract up in  the leg.  The remainder of the muscle tissue was taken down with  cautery.  The femur was then dissected free circumferentially and the  periosteum raised approximately 5 cm above the skin edge.  The femur was  divided at this level.  Leg was passed off the table as specimen.  Wound  was thoroughly inspected and found to be hemostatic.  Wound was  thoroughly irrigated with normal saline solution.   Fascial edges were  reapproximated using interrupted 2-0 Vicryl sutures.  Subcutaneous  tissues were reapproximated using running 3-0 Vicryl suture.  Skin was  closed with staples.  The patient tolerated the procedure well.  There  were no complications.  Instrument, sponge, and needle counts were  correct at the end of the case.  The patient was taken to recovery room  in stable condition.      Janetta Hora. Fields, MD  Electronically Signed     CEF/MEDQ  D:  09/28/2008  T:  09/28/2008  Job:  [id number]

## 2010-06-27 NOTE — H&P (Signed)
NAMEDIAGO, HAIK NO.:  [id number]   MEDICAL RECORD NO.:  [id number]          PATIENT TYPE:  AMB   LOCATION:  SDS                          FACILITY:  [facility]   PHYSICIAN:  Janetta Hora. Fields, MD  DATE OF BIRTH:  Jul 12, 1926   DATE OF ADMISSION:  DATE OF DISCHARGE:                              HISTORY & PHYSICAL   [name] [name] is an 77 year old male referred by Dr. Lestine Box for evaluation  of nonhealing ulcerations in his left foot.  The ulcerations have been  present for approximately 3-4 weeks.  He stated originally there was  much more redness in the left foot, but this has improved somewhat.  He  states that the swelling in his left foot has also improved a little  bit.  He denies any claudication-type symptoms at this point and it is  difficult to discern whether or not he has pain from the ulcerations or  rest pain in his left leg.  I had previously seen Mr. Cotham as recently  as October 2008.  He had been having intermittent ulcerations of his  legs at that time and had been given compression stockings.  At that  time, he had an ABI in the left leg of greater than 1; however, he seems  that deteriorated somewhat since then.  Of note, he has also had a  previous left femoral DVT and has had vein stripping performed in both  legs.   PAST MEDICAL HISTORY:  He had a ruptured brain aneurysm.  He denies  history of hypertension, diabetes or coronary artery disease.   PAST SURGICAL HISTORY:  He had a laparotomy for a ruptured appendix,  tonsillectomy and vein stripping procedure as described above.   FAMILY HISTORY:  Remarkable for his father who died at age 22 of heart  attack.   SOCIAL HISTORY:  He is married, has 2 children.  He is retired.  He is a  nonsmoker, non consumer of alcohol.   REVIEW OF SYSTEMS:  GENERAL:  He denies recent weight loss or gain.  CARDIAC:  He denies history of chest pain.  PULMONARY:  He has some  postnasal drip.  GI:  He denies  history of GI bleeding.  RENAL:  He has  no history of renal dysfunction or contrast nephropathy.  NEUROLOGIC:  He denies any syncopal episodes or dizziness.  He has no history of  seizures.  ORTHOPEDIC:  He has multiple joint arthritis.  Psychiatric,  ENT, and hematologic review of systems are all negative.   MEDICATIONS:  He takes none.   ALLERGIES:  He has none.   PHYSICAL EXAMINATION:  VITAL SIGNS:  Blood pressure is 140/79.  The left  arm pulse is 70 and regular.  HEENT:  Unremarkable.  NECK:  Carotid pulses 2+ without bruits.  CHEST:  Clear to auscultation.  CARDIAC:  Regular rate and rhythm without murmur.  ABDOMEN:  Slightly obese.  Soft, nontender, and nondistended.  No  masses.  EXTREMITIES:  He has a 2+ right femoral pulse and a 2+ radial pulse  bilaterally.  He has a  1+ left femoral pulse.  He has absent popliteal  and pedal pulses bilaterally.  He has kissing ulcers on the left foot  between toes to 2, 3, and 5.  On the left foot he also has several  changes in the toes and ankle consistent with Charcot-type changes,  although he denies any peripheral neuropathy and states that his  sensation is normal on direct exam.  He also has very lengthened  toenails on the left and right foot and I discussed with him getting  these trimmed.  He has evidence of venous stasis changes in both lower  extremities with brawny staining of the gait area on the left and right  leg.  There is some erythema on the dorsum of the foot with elevation  pallor.   At this point, I believe the best option for Mr. Teal would be an  aortogram with lower extremity runoff to see if there are any  percutaneous options for treating his left lower extremity vascular  tree.  If not, we may need to consider a bypass in this leg.  However,  since he has had multiple of vein stripping and obviously has evidence  of venous disease, he may need prosthetic conduit with a bypass is  necessary.  Risks,  benefits, possible complications, and procedure  details of angiography were explained to the patient today.  These are  included, but not limited to bleeding, infection, arterial injury, and  contrast reaction.  He understands and agrees to proceed.  This has been  scheduled with one of my partners Dr. Myra Gianotti for tomorrow, September 09, 2008.  If he requires a bypass, I will schedule it for him in near  future.      Janetta Hora. Fields, MD  Electronically Signed     CEF/MEDQ  D:  09/08/2008  T:  09/09/2008  Job:  [id number]   cc:   Molly Maduro A. Nicholos Johns, M.D.  Leonides Grills, M.D.

## 2010-07-17 ENCOUNTER — Ambulatory Visit (INDEPENDENT_AMBULATORY_CARE_PROVIDER_SITE_OTHER): Payer: Medicare Other | Admitting: *Deleted

## 2010-07-17 DIAGNOSIS — I4891 Unspecified atrial fibrillation: Secondary | ICD-10-CM

## 2010-07-17 LAB — POCT INR: INR: 2.8

## 2010-08-14 ENCOUNTER — Ambulatory Visit (INDEPENDENT_AMBULATORY_CARE_PROVIDER_SITE_OTHER): Payer: Medicare Other | Admitting: *Deleted

## 2010-08-14 DIAGNOSIS — I4891 Unspecified atrial fibrillation: Secondary | ICD-10-CM

## 2010-08-14 LAB — POCT INR: INR: 2.9

## 2010-08-22 ENCOUNTER — Encounter: Payer: Self-pay | Admitting: Cardiology

## 2010-08-29 ENCOUNTER — Encounter: Payer: Self-pay | Admitting: Cardiology

## 2010-08-29 ENCOUNTER — Ambulatory Visit (INDEPENDENT_AMBULATORY_CARE_PROVIDER_SITE_OTHER): Payer: Medicare Other | Admitting: Cardiology

## 2010-08-29 DIAGNOSIS — I4891 Unspecified atrial fibrillation: Secondary | ICD-10-CM

## 2010-08-29 DIAGNOSIS — R5383 Other fatigue: Secondary | ICD-10-CM

## 2010-08-29 DIAGNOSIS — I739 Peripheral vascular disease, unspecified: Secondary | ICD-10-CM

## 2010-08-29 DIAGNOSIS — R5381 Other malaise: Secondary | ICD-10-CM

## 2010-08-29 DIAGNOSIS — I82409 Acute embolism and thrombosis of unspecified deep veins of unspecified lower extremity: Secondary | ICD-10-CM

## 2010-08-29 NOTE — Assessment & Plan Note (Signed)
He is status post left AKA. His right lower extremity is without significant findings.

## 2010-08-29 NOTE — Patient Instructions (Signed)
We will check blood work today- chemistries, blood counts, thyroid.  I will see you back in 6 months.

## 2010-08-29 NOTE — Progress Notes (Signed)
   [name] [name] Date of Birth: 10/25/1926   History of Present Illness: [name] [name] is seen for followup today. He has been doing better while cardiac standpoint without any significant tachycardia or palpitations. He denies any shortness of breath or chest pain. He still has a difficult time getting around even with his leg prosthesis because of problems with balance and strength.  Current Outpatient Prescriptions on File Prior to Visit  Medication Sig Dispense Refill  . amiodarone (PACERONE) 100 MG tablet Take 100 mg by mouth daily.        . Multiple Vitamin (MULTIVITAMIN PO) Take by mouth as needed.        . warfarin (COUMADIN) 2 MG tablet Take 2 mg by mouth daily. Take as directed by coumadin clinic         No Known Allergies  Past Medical History  Diagnosis Date  . A-fib   . Peripheral arterial disease   . DVT (deep venous thrombosis)     history of  . Cerebral aneurysm     history of cerebral hemorrhage due to brain aneurysm rupture 25 yrs ago  . DJD (degenerative joint disease)   . Urethral stricture     history of    Past Surgical History  Procedure Date  . Varicose vein surgery   . Appendectomy   . Prostatectomy   . Tonsillectomy and adenoidectomy   . Above knee leg amputation     left    History  Smoking status  . Smoker, Current Status Unknown  Smokeless tobacco  . Not on file    History  Alcohol Use No    History reviewed. No pertinent family history.  Review of Systems:   All other systems were reviewed and are negative.  Physical Exam: BP 140/80  Pulse 80 He is a pleasant male in no distress. He is seen in a wheelchair. He is normocephalic, atraumatic. Pupils are equal round and reactive to light and accommodation. He has no JVD or bruits. Lungs are clear. Cardiac exam reveals a regular rate and rhythm without gallop or murmur. He has a prosthesis on his left side. His right leg is without edema. LABORATORY DATA: His most recent INR was  2.8.  Assessment / Plan:

## 2010-08-29 NOTE — Assessment & Plan Note (Signed)
His pulse is regular today. He is asymptomatic. He is on appropriate anticoagulant therapy and continues on amiodarone. Because of his amiodarone we will check followup lab work today including chemistries and TSH.

## 2010-08-30 LAB — HEPATIC FUNCTION PANEL
ALT: 24 U/L (ref 0–53)
Bilirubin, Direct: 0.1 mg/dL (ref 0.0–0.3)
Total Bilirubin: 0.6 mg/dL (ref 0.3–1.2)

## 2010-08-30 LAB — CBC WITH DIFFERENTIAL/PLATELET
Basophils Relative: 0.3 % (ref 0.0–3.0)
Eosinophils Relative: 2.1 % (ref 0.0–5.0)
HCT: 42.5 % (ref 39.0–52.0)
Hemoglobin: 14.2 g/dL (ref 13.0–17.0)
Lymphs Abs: 1.7 10*3/uL (ref 0.7–4.0)
MCV: 84 fl (ref 78.0–100.0)
Monocytes Absolute: 0.9 10*3/uL (ref 0.1–1.0)
Neutro Abs: 4.1 10*3/uL (ref 1.4–7.7)
Platelets: 178 10*3/uL (ref 150.0–400.0)
WBC: 6.8 10*3/uL (ref 4.5–10.5)

## 2010-08-30 LAB — BASIC METABOLIC PANEL
Calcium: 9 mg/dL (ref 8.4–10.5)
GFR: 79.61 mL/min (ref >60.00)
Potassium: 4.8 mEq/L (ref 3.5–5.1)
Sodium: 144 mEq/L (ref 135–145)

## 2010-09-01 ENCOUNTER — Telehealth: Payer: Self-pay | Admitting: *Deleted

## 2010-09-01 NOTE — Telephone Encounter (Signed)
Lm w/lab results. Will send copy to Dr. Kevan Ny

## 2010-09-01 NOTE — Telephone Encounter (Signed)
Message copied by Lorayne Bender on Fri Sep 01, 2010  8:37 AM ------      Message from: [place], PETER M      Created: Thu Aug 31, 2010 11:56 AM       Chemistries, TSH, and CBC all look great.

## 2010-09-11 ENCOUNTER — Encounter (INDEPENDENT_AMBULATORY_CARE_PROVIDER_SITE_OTHER): Payer: Medicare Other | Admitting: *Deleted

## 2010-09-11 DIAGNOSIS — I4891 Unspecified atrial fibrillation: Secondary | ICD-10-CM

## 2010-10-09 ENCOUNTER — Ambulatory Visit (INDEPENDENT_AMBULATORY_CARE_PROVIDER_SITE_OTHER): Payer: Medicare Other | Admitting: *Deleted

## 2010-10-09 DIAGNOSIS — I4891 Unspecified atrial fibrillation: Secondary | ICD-10-CM

## 2010-11-03 LAB — POCT HEMOGLOBIN-HEMACUE
Hemoglobin: 15
Operator id: 280881

## 2010-11-06 ENCOUNTER — Encounter: Payer: Medicare Other | Admitting: *Deleted

## 2010-11-15 ENCOUNTER — Encounter: Payer: Medicare Other | Admitting: *Deleted

## 2010-11-17 ENCOUNTER — Ambulatory Visit (INDEPENDENT_AMBULATORY_CARE_PROVIDER_SITE_OTHER): Payer: Medicare Other | Admitting: *Deleted

## 2010-11-17 DIAGNOSIS — I4891 Unspecified atrial fibrillation: Secondary | ICD-10-CM

## 2010-11-17 LAB — POCT INR: INR: 3.6

## 2010-12-01 ENCOUNTER — Ambulatory Visit (INDEPENDENT_AMBULATORY_CARE_PROVIDER_SITE_OTHER): Payer: Medicare Other | Admitting: *Deleted

## 2010-12-01 DIAGNOSIS — I4891 Unspecified atrial fibrillation: Secondary | ICD-10-CM

## 2010-12-01 DIAGNOSIS — Z7901 Long term (current) use of anticoagulants: Secondary | ICD-10-CM

## 2010-12-01 LAB — POCT INR: INR: 3.4

## 2010-12-15 ENCOUNTER — Ambulatory Visit (INDEPENDENT_AMBULATORY_CARE_PROVIDER_SITE_OTHER): Payer: Medicare Other | Admitting: *Deleted

## 2010-12-15 DIAGNOSIS — Z7901 Long term (current) use of anticoagulants: Secondary | ICD-10-CM

## 2010-12-15 DIAGNOSIS — I4891 Unspecified atrial fibrillation: Secondary | ICD-10-CM

## 2010-12-15 LAB — POCT INR: INR: 3.3

## 2010-12-28 ENCOUNTER — Other Ambulatory Visit: Payer: Self-pay | Admitting: Cardiology

## 2010-12-29 ENCOUNTER — Ambulatory Visit (INDEPENDENT_AMBULATORY_CARE_PROVIDER_SITE_OTHER): Payer: Medicare Other | Admitting: *Deleted

## 2010-12-29 DIAGNOSIS — Z7901 Long term (current) use of anticoagulants: Secondary | ICD-10-CM

## 2010-12-29 DIAGNOSIS — I4891 Unspecified atrial fibrillation: Secondary | ICD-10-CM

## 2010-12-29 LAB — POCT INR: INR: 2

## 2011-01-12 ENCOUNTER — Ambulatory Visit (INDEPENDENT_AMBULATORY_CARE_PROVIDER_SITE_OTHER): Payer: Medicare Other | Admitting: *Deleted

## 2011-01-12 DIAGNOSIS — I4891 Unspecified atrial fibrillation: Secondary | ICD-10-CM

## 2011-01-12 DIAGNOSIS — Z7901 Long term (current) use of anticoagulants: Secondary | ICD-10-CM

## 2011-01-26 IMAGING — CR DG CHEST 2V
2 series · 2 of 2 positions shown · non-contrast
Comparison: 04/10/2007

CLINICAL DATA: Preoperative respiratory exam.  Peripheral vascular
disease.

CHEST - 2 VIEW

[view not recorded (1 of 2)]
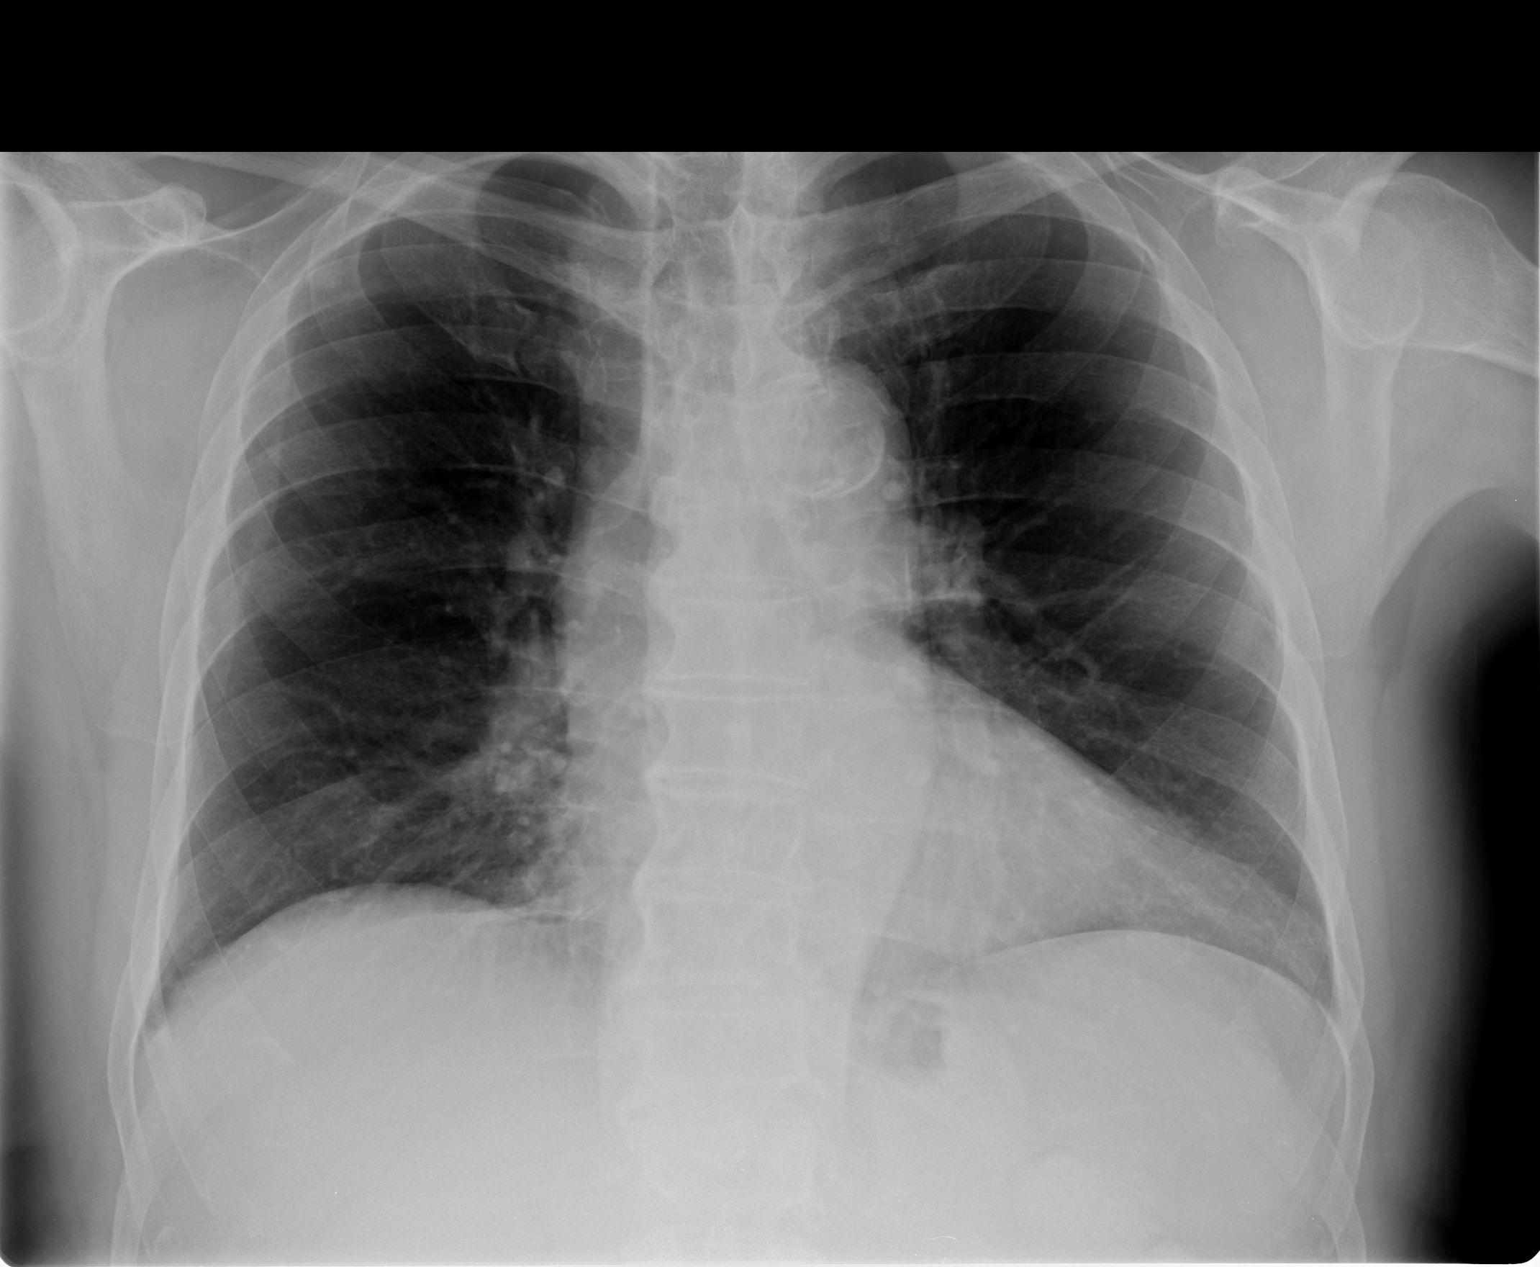

[view not recorded (2 of 2)]
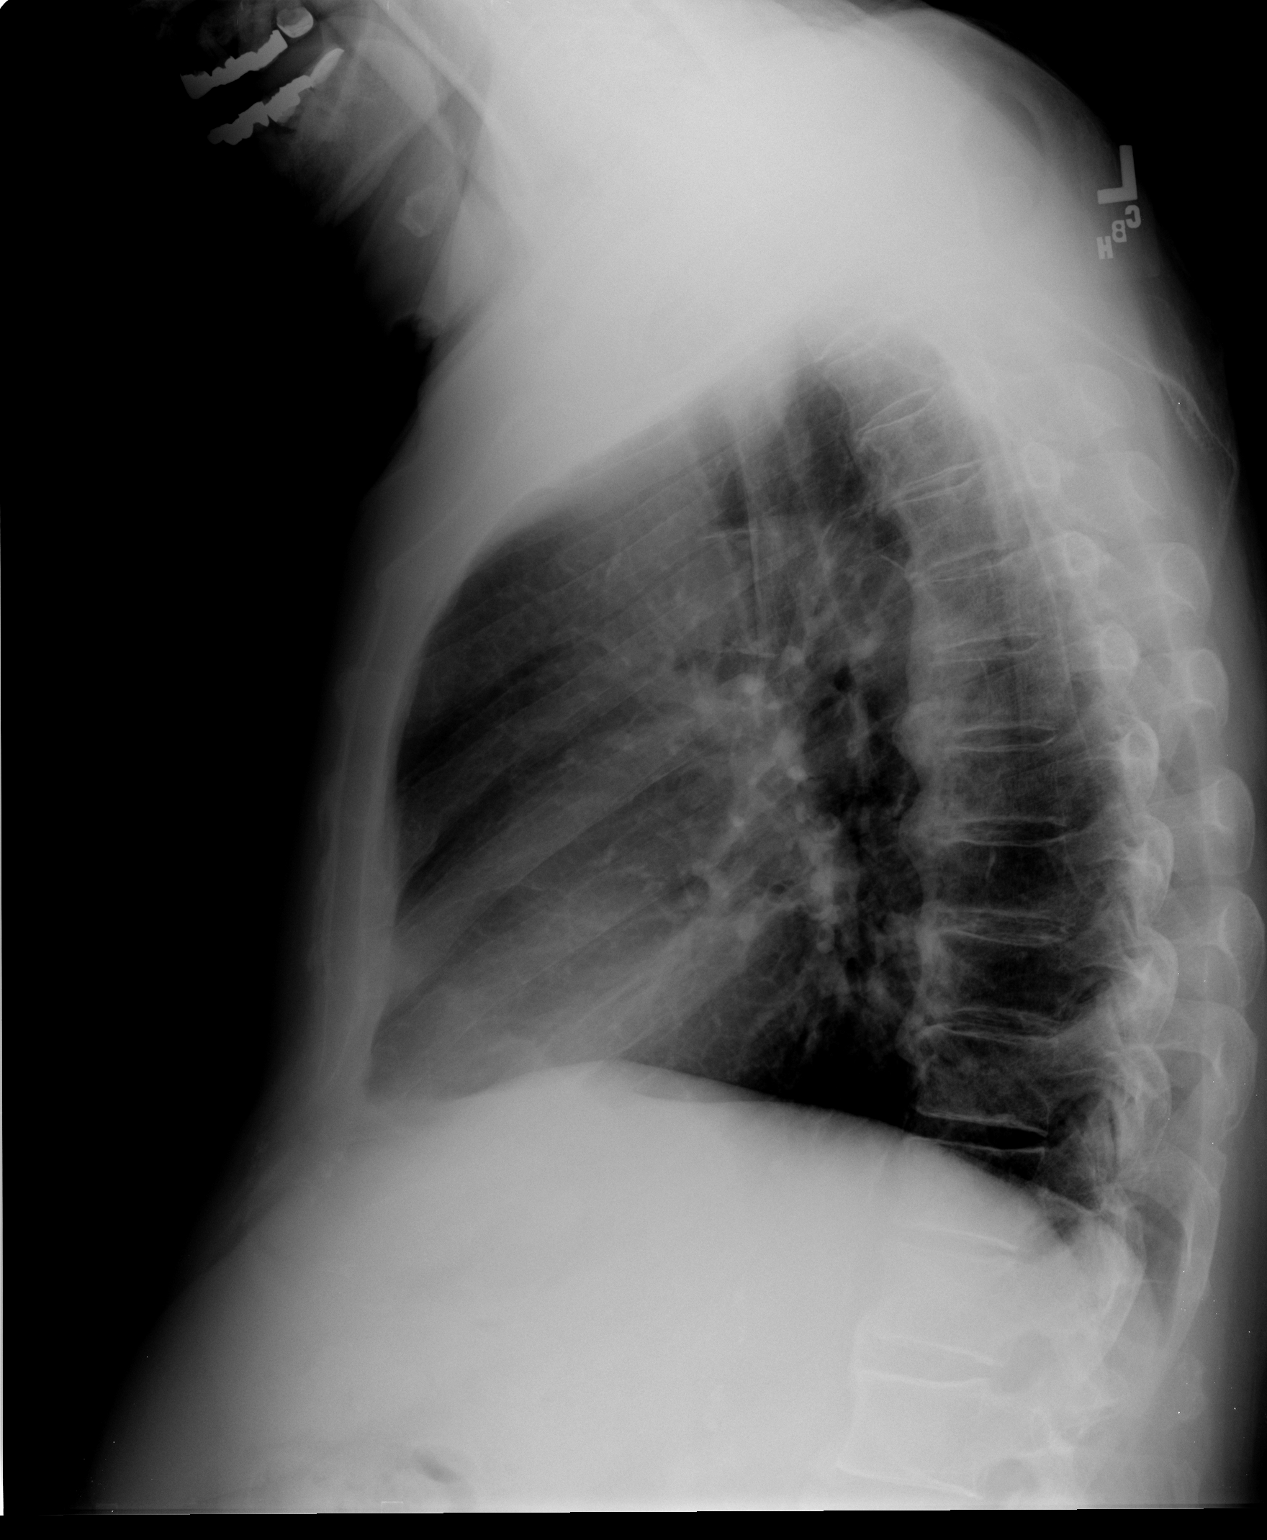

[2 of 2 positions shown; findings below may reference images not displayed]

FINDINGS: There is a small nodular density at the right apex
laterally overlying the anterior aspect of right second rib. With
benefit of retrospection, there was a tiny area of pleural
thickening on the prior study at that area but this appears
slightly more prominent.

The lungs are otherwise clear.

Heart size and vascularity are normal.  No significant bony
abnormality.
IMPRESSION: Pleural based nodular density in the right apex that is  8 mm in
diameter.  Limited CT scan of the chest may be useful for baseline
evaluation.

No other significant abnormality.

## 2011-01-28 ENCOUNTER — Other Ambulatory Visit: Payer: Self-pay | Admitting: Cardiology

## 2011-01-31 IMAGING — CT CT CHEST W/O CM
3 series · 17 of 29 positions shown, 19 images · non-contrast
Comparison: None

CLINICAL DATA: Rapid atrial fibrillation after left "above the
knee" amputation.

CT CHEST WITHOUT CONTRAST
TECHNIQUE: Multidetector CT imaging of the chest was performed
following the standard protocol without IV contrast.

[Series 2: routine chest · axial · 0.83mm/px · z∈[-321,-116]mm · 6 of 63 slices shown, 8 images]
[im 11/63  mediastinal]
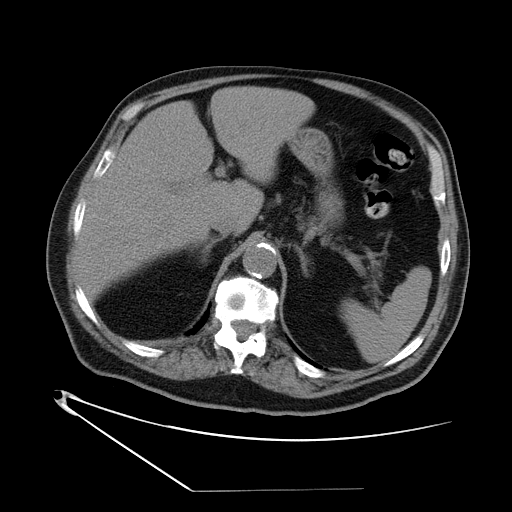
[im 11/63  lung]
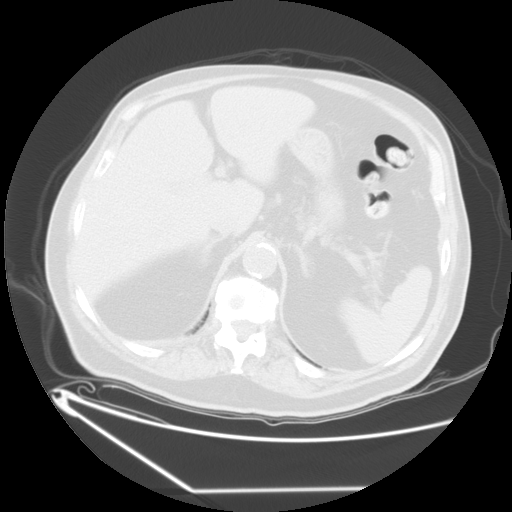
[im 21/63  lung]
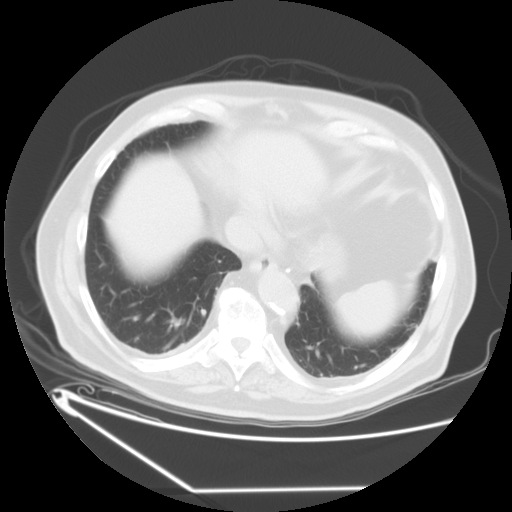
[im 32/63  lung]
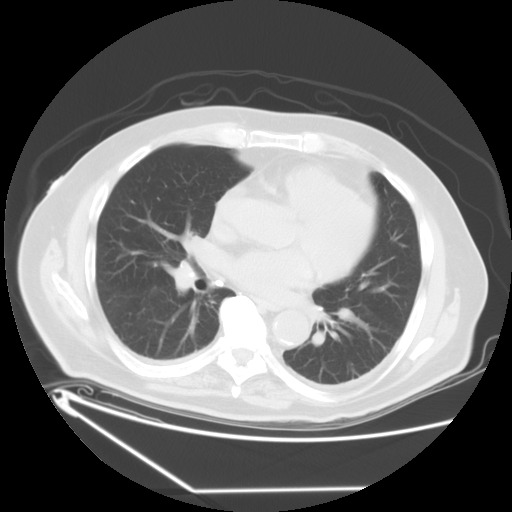
[im 36/63  lung]
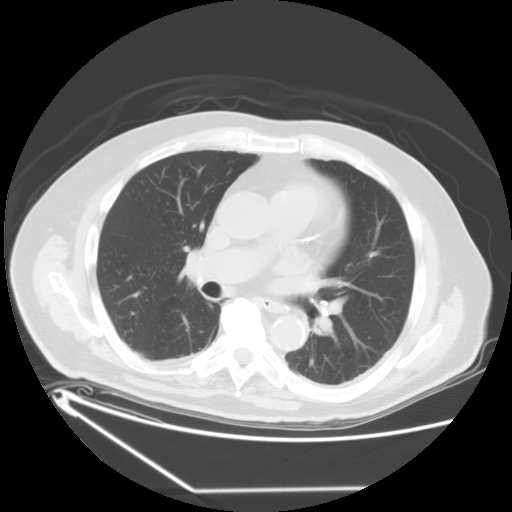
[im 42/63  mediastinal]
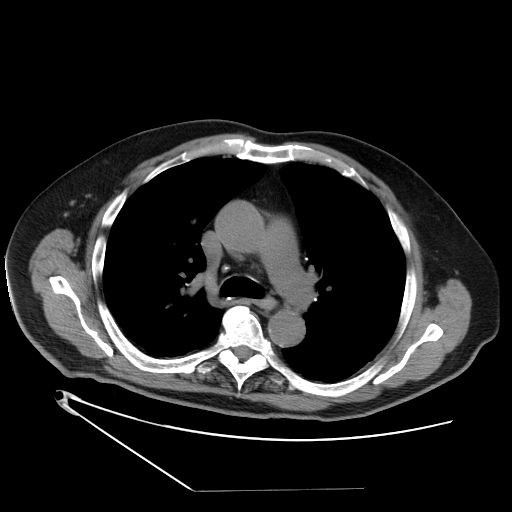
[im 42/63  lung]
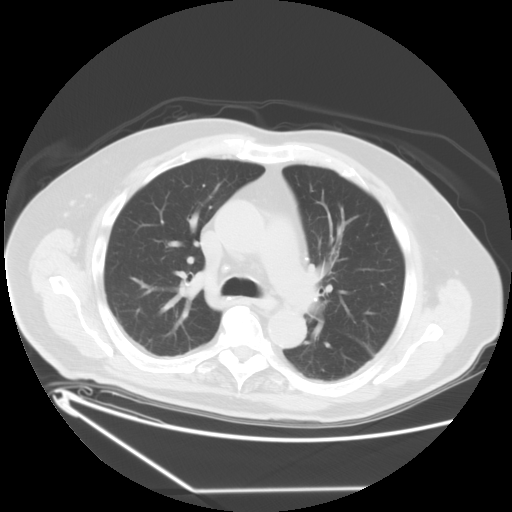
[im 52/63  lung]
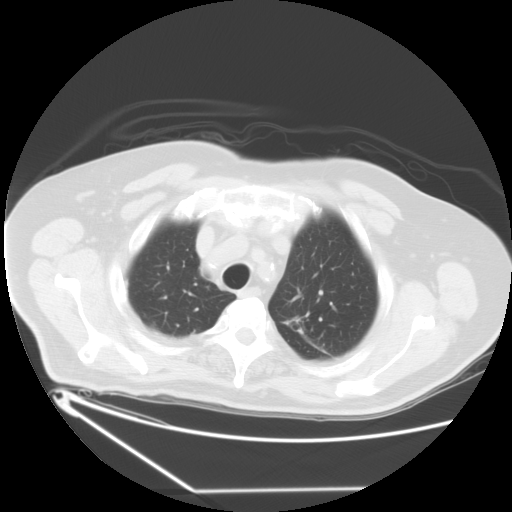

[Series 400: reformatted · sagittal · 0.83mm/px · 8 of 99 slices shown (1 of 2)]
[im 9/99  lung]
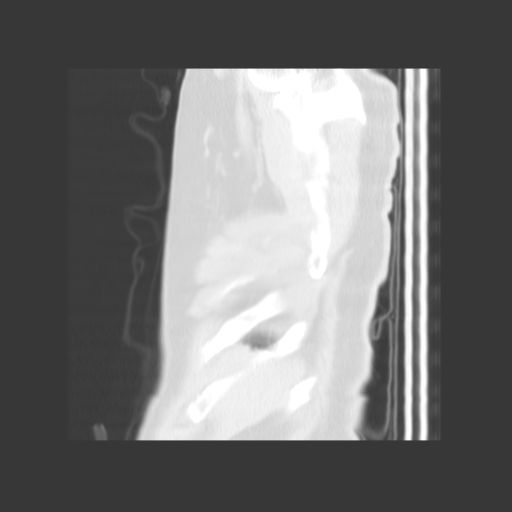
[im 18/99  lung]
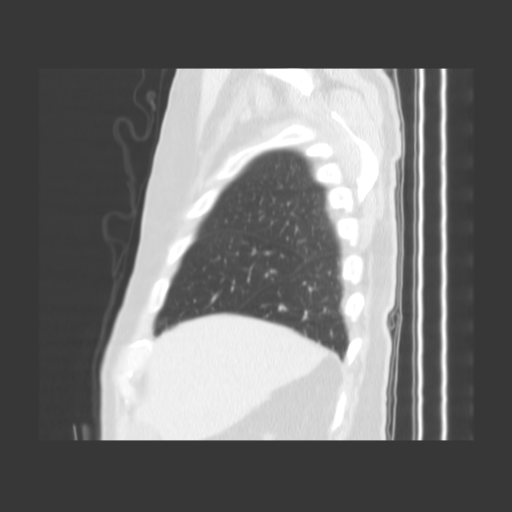
[im 36/99  lung]
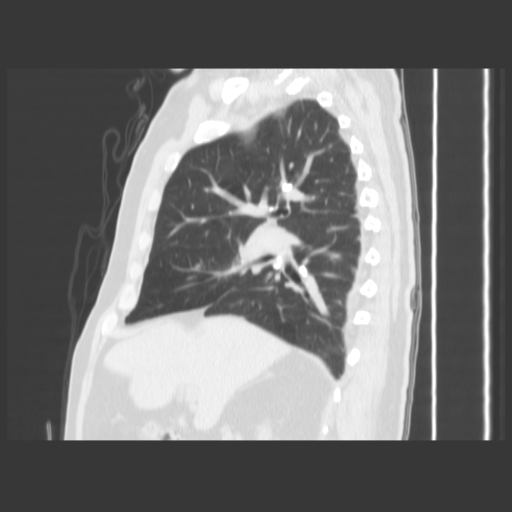
[im 45/99  lung]
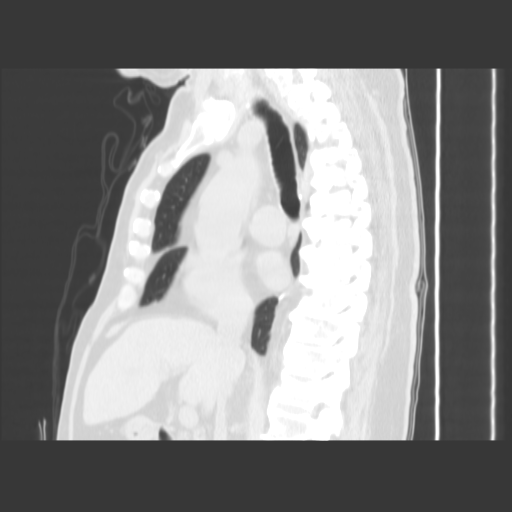
[im 54/99  lung]
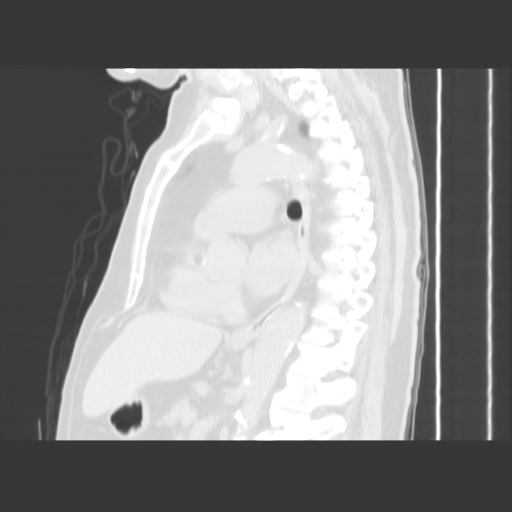
[im 63/99  lung]
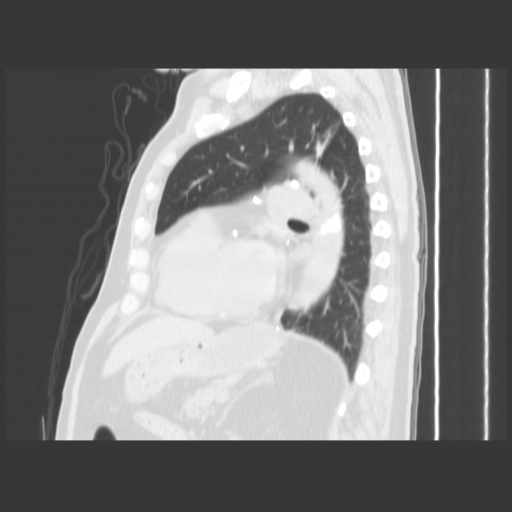
[im 81/99  lung]
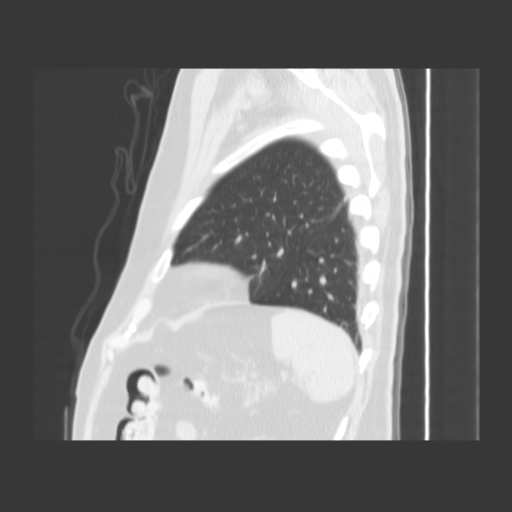
[im 90/99  lung]
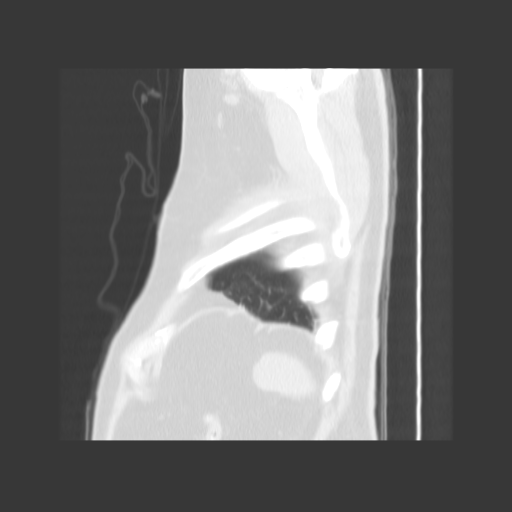

[Series 401: reformatted · coronal · 0.83mm/px · 3 of 76 slices shown (2 of 2)]
[im 10/76  lung]
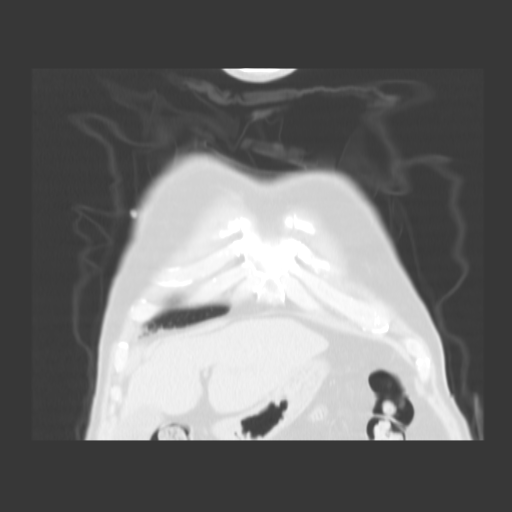
[im 19/76  lung]
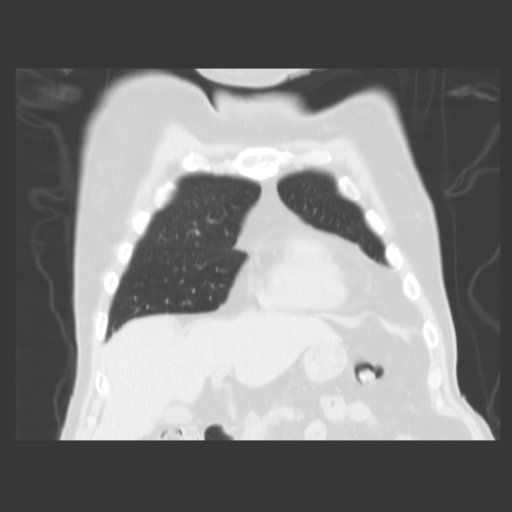
[im 29/76  lung]
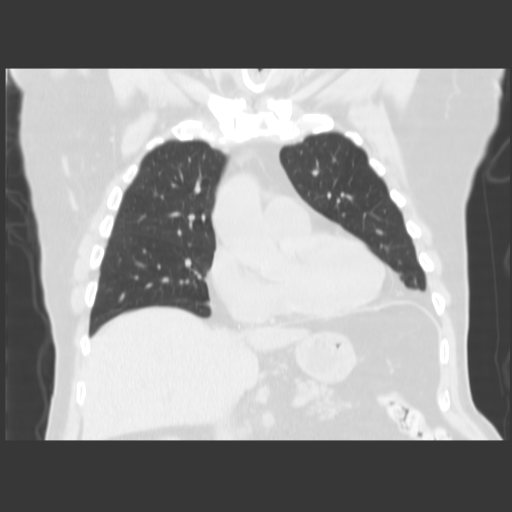

[17 of 29 positions shown; findings below may reference images not displayed]

FINDINGS: No enlarged axillary or supraclavicular lymph nodes.

There are no enlarged mediastinal or hilar lymph nodes.Calcified
mediastinal lymph nodes are noted consistent with prior
granulomatous inflammation/infection.

There is mild pleural thickening identified in both lung bases
posteriorly.

There is a focal scar like densities identified within the left
upper lobe, image number 54 of the coronal series 401.

Review of the visualized osseous structures is significant for
degenerative disc disease.

Limited imaging through the upper abdomen shows a tiny stones
within the upper pole of both kidneys.
IMPRESSION: 1.  No active cardiopulmonary abnormalities.
2.  Left upper lobe scarring.

## 2011-02-09 ENCOUNTER — Ambulatory Visit (INDEPENDENT_AMBULATORY_CARE_PROVIDER_SITE_OTHER): Payer: Medicare Other | Admitting: *Deleted

## 2011-02-09 DIAGNOSIS — Z7901 Long term (current) use of anticoagulants: Secondary | ICD-10-CM

## 2011-02-09 DIAGNOSIS — I4891 Unspecified atrial fibrillation: Secondary | ICD-10-CM

## 2011-02-09 LAB — POCT INR: INR: 2.1

## 2011-03-09 ENCOUNTER — Encounter: Payer: Medicare Other | Admitting: *Deleted

## 2011-03-09 ENCOUNTER — Ambulatory Visit: Payer: Medicare Other | Admitting: Cardiology

## 2011-03-13 ENCOUNTER — Encounter: Payer: Medicare Other | Admitting: *Deleted

## 2011-03-14 ENCOUNTER — Ambulatory Visit (INDEPENDENT_AMBULATORY_CARE_PROVIDER_SITE_OTHER): Payer: Medicare Other | Admitting: *Deleted

## 2011-03-14 DIAGNOSIS — I4891 Unspecified atrial fibrillation: Secondary | ICD-10-CM

## 2011-03-14 DIAGNOSIS — Z7901 Long term (current) use of anticoagulants: Secondary | ICD-10-CM

## 2011-04-03 ENCOUNTER — Encounter: Payer: Self-pay | Admitting: Cardiology

## 2011-04-03 ENCOUNTER — Ambulatory Visit (INDEPENDENT_AMBULATORY_CARE_PROVIDER_SITE_OTHER): Payer: Medicare Other | Admitting: Cardiology

## 2011-04-03 ENCOUNTER — Ambulatory Visit (INDEPENDENT_AMBULATORY_CARE_PROVIDER_SITE_OTHER): Payer: Medicare Other | Admitting: Pharmacist

## 2011-04-03 VITALS — BP 143/71 | HR 71 | Ht 69.0 in | Wt 226.8 lb

## 2011-04-03 DIAGNOSIS — I4891 Unspecified atrial fibrillation: Secondary | ICD-10-CM

## 2011-04-03 DIAGNOSIS — I779 Disorder of arteries and arterioles, unspecified: Secondary | ICD-10-CM

## 2011-04-03 DIAGNOSIS — Z7901 Long term (current) use of anticoagulants: Secondary | ICD-10-CM

## 2011-04-03 DIAGNOSIS — I739 Peripheral vascular disease, unspecified: Secondary | ICD-10-CM

## 2011-04-03 LAB — CBC WITH DIFFERENTIAL/PLATELET
Basophils Absolute: 0 10*3/uL (ref 0.0–0.1)
HCT: 41.9 % (ref 39.0–52.0)
Lymphs Abs: 1.2 10*3/uL (ref 0.7–4.0)
MCV: 84.2 fl (ref 78.0–100.0)
Monocytes Absolute: 0.9 10*3/uL (ref 0.1–1.0)
Neutrophils Relative %: 63.8 % (ref 43.0–77.0)
Platelets: 152 10*3/uL (ref 150.0–400.0)
RDW: 14.5 % (ref 11.5–14.6)

## 2011-04-03 NOTE — Progress Notes (Signed)
   [name] [name] Date of Birth: 09-Nov-1926   History of Present Illness: [name] [name] is seen for followup today. He has been doing well from a cardiac standpoint. He still has some shortness of breath on exertion but is very sedentary. He denies any increase in edema or orthopnea. He's had no significant chest pain or palpitations.  Current Outpatient Prescriptions on File Prior to Visit  Medication Sig Dispense Refill  . amiodarone (PACERONE) 200 MG tablet TAKE 1/2 TABLET BY MOUTH EVERY DAY  30 tablet  1  . Multiple Vitamin (MULTIVITAMIN PO) Take by mouth as needed.        . warfarin (COUMADIN) 2 MG tablet Take 1 tablet (2 mg total) by mouth as directed.  30 tablet  3  . DISCONTD: amiodarone (PACERONE) 100 MG tablet Take 100 mg by mouth daily.          No Known Allergies  Past Medical History  Diagnosis Date  . A-fib   . Peripheral arterial disease   . DVT (deep venous thrombosis)     history of  . Cerebral aneurysm     history of cerebral hemorrhage due to brain aneurysm rupture 25 yrs ago  . DJD (degenerative joint disease)   . Urethral stricture     history of    Past Surgical History  Procedure Date  . Varicose vein surgery   . Appendectomy   . Prostatectomy   . Tonsillectomy and adenoidectomy   . Above knee leg amputation     left    History  Smoking status  . Smoker, Current Status Unknown  Smokeless tobacco  . Not on file    History  Alcohol Use No    History reviewed. No pertinent family history.  Review of Systems:  As noted in history of present illness. All other systems were reviewed and are negative.  Physical Exam: BP 143/71  Pulse 71  Ht 5\' 9"  (1.753 m)  Wt 102.876 kg (226 lb 12.8 oz)  BMI 33.49 kg/m2 He is a pleasant male in no distress. He is seen in a wheelchair. He is normocephalic, atraumatic. Pupils are equal round and reactive to light and accommodation. He has no JVD or bruits. Lungs reveal bilateral dry crackles. Cardiac exam reveals a  regular rate and rhythm without gallop or murmur. He has a prosthesis on his left side. His right leg is without edema. LABORATORY DATA: His most recent INR was 2.2. ECG today demonstrates normal sinus rhythm with left axis deviation. He has low voltage. There is poor R wave progression in the anterior precordial leads.  Assessment / Plan:

## 2011-04-03 NOTE — Assessment & Plan Note (Signed)
This appears to be well controlled on low-dose amiodarone. We will continue with 100 mg daily. We will follow pertinent lab work today including liver function studies and thyroid testing.

## 2011-04-03 NOTE — Patient Instructions (Addendum)
We will check blood work on you today.  BMET, CBC, TSH, HFP  Continue your current medication.  I will see you again in 6 months.

## 2011-04-03 NOTE — Assessment & Plan Note (Signed)
He is therapeutic on his Coumadin. He will continue followup in our Coumadin clinic.

## 2011-04-04 LAB — BASIC METABOLIC PANEL
BUN: 16 mg/dL (ref 6–23)
Creatinine, Ser: 0.9 mg/dL (ref 0.4–1.5)
GFR: 81.45 mL/min (ref >60.00)
Glucose, Bld: 77 mg/dL (ref 70–99)
Potassium: 3.9 mEq/L (ref 3.5–5.1)

## 2011-04-04 LAB — HEPATIC FUNCTION PANEL: Total Bilirubin: 0.4 mg/dL (ref 0.3–1.2)

## 2011-04-04 LAB — TSH: TSH: 2.76 u[IU]/mL (ref 0.35–5.50)

## 2011-04-09 ENCOUNTER — Telehealth: Payer: Self-pay | Admitting: Cardiology

## 2011-04-09 NOTE — Telephone Encounter (Signed)
Patient called was told labs normal.

## 2011-04-09 NOTE — Telephone Encounter (Signed)
FU Call: pt returning call from our office. Please return pt call to discuss further.  

## 2011-04-24 ENCOUNTER — Ambulatory Visit (INDEPENDENT_AMBULATORY_CARE_PROVIDER_SITE_OTHER): Payer: Medicare Other

## 2011-04-24 DIAGNOSIS — I4891 Unspecified atrial fibrillation: Secondary | ICD-10-CM

## 2011-04-24 DIAGNOSIS — Z7901 Long term (current) use of anticoagulants: Secondary | ICD-10-CM

## 2011-04-24 LAB — POCT INR: INR: 2.3

## 2011-05-22 ENCOUNTER — Ambulatory Visit (INDEPENDENT_AMBULATORY_CARE_PROVIDER_SITE_OTHER): Payer: Medicare Other | Admitting: *Deleted

## 2011-05-22 DIAGNOSIS — Z7901 Long term (current) use of anticoagulants: Secondary | ICD-10-CM

## 2011-05-22 DIAGNOSIS — I4891 Unspecified atrial fibrillation: Secondary | ICD-10-CM

## 2011-05-22 LAB — POCT INR: INR: 2.1

## 2011-06-11 ENCOUNTER — Other Ambulatory Visit: Payer: Self-pay | Admitting: *Deleted

## 2011-06-11 MED ORDER — AMIODARONE HCL 200 MG PO TABS: 100.0000 mg | ORAL_TABLET | Freq: Every day | ORAL | Status: DC

## 2011-06-12 ENCOUNTER — Ambulatory Visit (INDEPENDENT_AMBULATORY_CARE_PROVIDER_SITE_OTHER): Payer: Medicare Other

## 2011-06-12 DIAGNOSIS — I4891 Unspecified atrial fibrillation: Secondary | ICD-10-CM

## 2011-06-12 DIAGNOSIS — Z7901 Long term (current) use of anticoagulants: Secondary | ICD-10-CM

## 2011-06-12 LAB — POCT INR: INR: 2.1

## 2011-07-10 ENCOUNTER — Ambulatory Visit (INDEPENDENT_AMBULATORY_CARE_PROVIDER_SITE_OTHER): Payer: Medicare Other | Admitting: *Deleted

## 2011-07-10 DIAGNOSIS — I4891 Unspecified atrial fibrillation: Secondary | ICD-10-CM

## 2011-07-10 DIAGNOSIS — Z7901 Long term (current) use of anticoagulants: Secondary | ICD-10-CM

## 2011-07-10 LAB — POCT INR: INR: 2

## 2011-07-31 ENCOUNTER — Ambulatory Visit (INDEPENDENT_AMBULATORY_CARE_PROVIDER_SITE_OTHER): Payer: Medicare Other | Admitting: Pharmacist

## 2011-07-31 DIAGNOSIS — Z7901 Long term (current) use of anticoagulants: Secondary | ICD-10-CM

## 2011-07-31 DIAGNOSIS — I4891 Unspecified atrial fibrillation: Secondary | ICD-10-CM

## 2011-07-31 LAB — POCT INR: INR: 1.5

## 2011-08-21 ENCOUNTER — Ambulatory Visit (INDEPENDENT_AMBULATORY_CARE_PROVIDER_SITE_OTHER): Payer: Medicare Other | Admitting: *Deleted

## 2011-08-21 DIAGNOSIS — I4891 Unspecified atrial fibrillation: Secondary | ICD-10-CM

## 2011-08-21 DIAGNOSIS — Z7901 Long term (current) use of anticoagulants: Secondary | ICD-10-CM

## 2011-08-21 LAB — POCT INR: INR: 1.8

## 2011-09-03 ENCOUNTER — Other Ambulatory Visit: Payer: Self-pay | Admitting: *Deleted

## 2011-09-03 MED ORDER — WARFARIN SODIUM 2 MG PO TABS: 2.0000 mg | ORAL_TABLET | ORAL | Status: DC

## 2011-09-04 ENCOUNTER — Ambulatory Visit (INDEPENDENT_AMBULATORY_CARE_PROVIDER_SITE_OTHER): Payer: Medicare Other

## 2011-09-04 DIAGNOSIS — I4891 Unspecified atrial fibrillation: Secondary | ICD-10-CM

## 2011-09-04 DIAGNOSIS — Z7901 Long term (current) use of anticoagulants: Secondary | ICD-10-CM

## 2011-09-04 LAB — POCT INR: INR: 2.3

## 2011-09-18 ENCOUNTER — Ambulatory Visit (INDEPENDENT_AMBULATORY_CARE_PROVIDER_SITE_OTHER): Payer: Medicare Other | Admitting: *Deleted

## 2011-09-18 DIAGNOSIS — I4891 Unspecified atrial fibrillation: Secondary | ICD-10-CM

## 2011-09-18 DIAGNOSIS — Z7901 Long term (current) use of anticoagulants: Secondary | ICD-10-CM

## 2011-10-23 ENCOUNTER — Ambulatory Visit (INDEPENDENT_AMBULATORY_CARE_PROVIDER_SITE_OTHER): Payer: Medicare Other

## 2011-10-23 DIAGNOSIS — Z7901 Long term (current) use of anticoagulants: Secondary | ICD-10-CM

## 2011-10-23 DIAGNOSIS — I4891 Unspecified atrial fibrillation: Secondary | ICD-10-CM

## 2011-11-20 ENCOUNTER — Ambulatory Visit (INDEPENDENT_AMBULATORY_CARE_PROVIDER_SITE_OTHER): Payer: Medicare Other | Admitting: *Deleted

## 2011-11-20 DIAGNOSIS — Z7901 Long term (current) use of anticoagulants: Secondary | ICD-10-CM

## 2011-11-20 DIAGNOSIS — I4891 Unspecified atrial fibrillation: Secondary | ICD-10-CM

## 2011-12-03 ENCOUNTER — Other Ambulatory Visit: Payer: Self-pay | Admitting: *Deleted

## 2011-12-03 DIAGNOSIS — I739 Peripheral vascular disease, unspecified: Secondary | ICD-10-CM

## 2011-12-03 DIAGNOSIS — I4891 Unspecified atrial fibrillation: Secondary | ICD-10-CM

## 2011-12-04 ENCOUNTER — Ambulatory Visit (INDEPENDENT_AMBULATORY_CARE_PROVIDER_SITE_OTHER): Payer: Medicare Other | Admitting: Nurse Practitioner

## 2011-12-04 ENCOUNTER — Other Ambulatory Visit (INDEPENDENT_AMBULATORY_CARE_PROVIDER_SITE_OTHER): Payer: Medicare Other

## 2011-12-04 ENCOUNTER — Encounter: Payer: Self-pay | Admitting: Nurse Practitioner

## 2011-12-04 ENCOUNTER — Ambulatory Visit (INDEPENDENT_AMBULATORY_CARE_PROVIDER_SITE_OTHER): Payer: Medicare Other | Admitting: Pharmacist

## 2011-12-04 VITALS — BP 142/68 | HR 68 | Ht 69.5 in

## 2011-12-04 DIAGNOSIS — Z7901 Long term (current) use of anticoagulants: Secondary | ICD-10-CM

## 2011-12-04 DIAGNOSIS — I4891 Unspecified atrial fibrillation: Secondary | ICD-10-CM

## 2011-12-04 DIAGNOSIS — R0989 Other specified symptoms and signs involving the circulatory and respiratory systems: Secondary | ICD-10-CM

## 2011-12-04 DIAGNOSIS — Z79899 Other long term (current) drug therapy: Secondary | ICD-10-CM

## 2011-12-04 DIAGNOSIS — I739 Peripheral vascular disease, unspecified: Secondary | ICD-10-CM

## 2011-12-04 LAB — CBC WITH DIFFERENTIAL/PLATELET
Basophils Absolute: 0 10*3/uL (ref 0.0–0.1)
Basophils Relative: 0.4 % (ref 0.0–3.0)
Eosinophils Absolute: 0.1 10*3/uL (ref 0.0–0.7)
Eosinophils Relative: 2.2 % (ref 0.0–5.0)
HCT: 41.5 % (ref 39.0–52.0)
Hemoglobin: 13.5 g/dL (ref 13.0–17.0)
Lymphocytes Relative: 17.8 % (ref 12.0–46.0)
Lymphs Abs: 1.1 10*3/uL (ref 0.7–4.0)
MCHC: 32.5 g/dL (ref 30.0–36.0)
MCV: 85.3 fl (ref 78.0–100.0)
Monocytes Absolute: 0.7 10*3/uL (ref 0.1–1.0)
Monocytes Relative: 12 % (ref 3.0–12.0)
Neutro Abs: 4.1 10*3/uL (ref 1.4–7.7)
Neutrophils Relative %: 67.6 % (ref 43.0–77.0)
Platelets: 162 10*3/uL (ref 150.0–400.0)
RBC: 4.86 Mil/uL (ref 4.22–5.81)
RDW: 14.2 % (ref 11.5–14.6)
WBC: 6.1 10*3/uL (ref 4.5–10.5)

## 2011-12-04 LAB — BASIC METABOLIC PANEL
BUN: 17 mg/dL (ref 6–23)
CO2: 26 mEq/L (ref 19–32)
Calcium: 8.6 mg/dL (ref 8.4–10.5)
Chloride: 107 mEq/L (ref 96–112)
Creatinine, Ser: 0.9 mg/dL (ref 0.4–1.5)
GFR: 86.61 mL/min (ref >60.00)
Glucose, Bld: 94 mg/dL (ref 70–99)
Potassium: 3.6 mEq/L (ref 3.5–5.1)
Sodium: 139 mEq/L (ref 135–145)

## 2011-12-04 LAB — HEPATIC FUNCTION PANEL
ALT: 32 U/L (ref 0–53)
AST: 28 U/L (ref 0–37)
Albumin: 3.3 g/dL — ABNORMAL LOW (ref 3.5–5.2)
Alkaline Phosphatase: 46 U/L (ref 39–117)
Bilirubin, Direct: 0 mg/dL (ref 0.0–0.3)
Total Bilirubin: 0.5 mg/dL (ref 0.3–1.2)
Total Protein: 6.8 g/dL (ref 6.0–8.3)

## 2011-12-04 LAB — POCT INR: INR: 3.4

## 2011-12-04 NOTE — Progress Notes (Signed)
[name] [name] Date of Birth: 10-16-1926 Medical Record #[id number]  History of Present Illness: [name] [name] is seen today for a follow up visit. He is seen for Dr. [place]. He has atrial fib and has been on chronic amiodarone. Other issues include PAD, DVT, remote cerebral aneurysm, DJD, and past amputations of the left leg. He remains on chronic coumadin therapy.   He was last here in February and felt to be doing ok.   He comes in today. He is here with his wife. He is doing ok. Says he feels pretty good. No chest pain. Not short of breath. No rhythm issues. Taking his medicines without problems. Needs labs and an INR today. 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   Current Outpatient Prescriptions on File Prior to Visit  Medication Sig Dispense Refill  . amiodarone (PACERONE) 200 MG tablet Take 0.5 tablets (100 mg total) by mouth daily.  30 tablet  6  . Multiple Vitamin (MULTIVITAMIN PO) Take by mouth as needed.        . warfarin (COUMADIN) 2 MG tablet Take 1 tablet (2 mg total) by mouth as directed.  30 tablet  3    No Known Allergies  Past Medical History  Diagnosis Date  . A-fib   . Peripheral arterial disease   . DVT (deep venous thrombosis)     history of  . Cerebral aneurysm     history of cerebral hemorrhage due to brain aneurysm rupture 25 yrs ago  . DJD (degenerative joint disease)   . Urethral stricture     history of    Past Surgical History  Procedure Date  . Varicose vein surgery   . Appendectomy   . Prostatectomy   . Tonsillectomy and adenoidectomy   . Above knee leg amputation     left    History  Smoking status  . Never Smoker   Smokeless tobacco  . Not on file    History  Alcohol Use No    History reviewed. No pertinent family history.  Review of Systems: The review of systems is per the HPI.  All other systems were reviewed and are negative.  Physical Exam: BP 142/68  Pulse 68  Ht 5' 9.5" (1.765 m) He is not able to weigh.    Patient is very pleasant and in no acute distress. Skin is warm and dry. Color is normal.  HEENT is unremarkable. Normocephalic/atraumatic. PERRL. Sclera are nonicteric. Neck is supple. No masses. No JVD. Lungs are clear. Cardiac exam shows an irregular rhythm. Abdomen is soft. Extremities are without edema. Gait is not tested. Left leg prosthesis in place. ROM appears intact. No gross neurologic deficits noted.  LABORATORY DATA:  His EKG however shows sinus rhythm with poor R wave progression.   Lab Results  Component Value Date   WBC 6.5 04/03/2011   HGB 13.7 04/03/2011   HCT 41.9 04/03/2011   PLT 152.0 04/03/2011   GLUCOSE 77 04/03/2011   ALT 24 04/03/2011   AST 22 04/03/2011   NA 141 04/03/2011   K 3.9 04/03/2011   CL 107 04/03/2011   CREATININE 0.9 04/03/2011   BUN 16 04/03/2011   CO2 25 04/03/2011   TSH 2.76 04/03/2011   INR 2.3 11/20/2011   HGBA1C  Value: 5.9 (NOTE) The ADA recommends the following therapeutic goal for glycemic control related to Hgb A1c measurement: Goal of therapy: <6.5 Hgb A1c  Reference: American Diabetes Association: Clinical Practice Recommendations 2010, Diabetes Care, 2010,  33: (Suppl  1). 09/29/2008   Lab Results  Component Value Date   INR 2.3 11/20/2011   INR 3.1 10/23/2011   INR 2.6 09/18/2011    Assessment / Plan:  1. PAF - maintaining sinus per EKG today. On low dose amiodarone. Needs to get follow up labs today.   2. Chronic coumadin therapy - no problems noted. Needs INR today  3. PAD - past leg amputation.   Overall, he seems to be holding his own. We will see him back in 6 months. Patient is agreeable to this plan and will call if any problems develop in the interim.

## 2011-12-04 NOTE — Patient Instructions (Addendum)
We need to check labs today and a coumadin check.  We are going to check an EKG - this shows that you are still in a regular rhythm today  See Dr. [place] in 6 months.  Call the [facility] office at [phone] if you have any questions, problems or concerns.

## 2011-12-07 LAB — TSH: TSH: 2.25 u[IU]/mL (ref 0.35–5.50)

## 2011-12-22 ENCOUNTER — Encounter: Payer: Self-pay | Admitting: Family Medicine

## 2011-12-22 ENCOUNTER — Ambulatory Visit (INDEPENDENT_AMBULATORY_CARE_PROVIDER_SITE_OTHER): Payer: BLUE CROSS/BLUE SHIELD | Admitting: Family Medicine

## 2011-12-22 VITALS — BP 134/66 | HR 70 | Temp 98.1°F | Resp 16

## 2011-12-22 DIAGNOSIS — Z23 Encounter for immunization: Secondary | ICD-10-CM

## 2011-12-22 DIAGNOSIS — R269 Unspecified abnormalities of gait and mobility: Secondary | ICD-10-CM

## 2011-12-22 NOTE — Patient Instructions (Signed)
Return if we can be of further assistance.

## 2011-12-22 NOTE — Progress Notes (Signed)
Subjective: Patient is here for a couple of things. He needs his flu shot.  Patient has a history of a gait disturbance problem. 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 He has spasms and difficulty straightening his right hamstrings fully. He has to use For translation. He needs some forms filled out. He is going to return to his prostatitis to get the length of his prosthesis evaluated. He is able to get around with a walker some, and uses a wheelchair her more. His wife usually accompanies him when he goes out. He is at high risk of falls, and has been on Coumadin for atrial fibrillation so a fall care is here much more potential harm. He does have a gait disturbance.  Objective: Excessive exam not done. He has little difficulty straightening his right leg. He does ambulate with the left prosthesis with necessary assistance.  Assessment: Gait disturbance Flu shot  Plan: Completed form for SCAT Flu shot administered

## 2011-12-25 ENCOUNTER — Ambulatory Visit (INDEPENDENT_AMBULATORY_CARE_PROVIDER_SITE_OTHER): Payer: Medicare Other | Admitting: *Deleted

## 2011-12-25 DIAGNOSIS — Z7901 Long term (current) use of anticoagulants: Secondary | ICD-10-CM

## 2011-12-25 DIAGNOSIS — I4891 Unspecified atrial fibrillation: Secondary | ICD-10-CM

## 2011-12-27 ENCOUNTER — Encounter: Payer: Self-pay | Admitting: Vascular Surgery

## 2012-01-08 ENCOUNTER — Ambulatory Visit (INDEPENDENT_AMBULATORY_CARE_PROVIDER_SITE_OTHER): Payer: Medicare Other

## 2012-01-08 DIAGNOSIS — I4891 Unspecified atrial fibrillation: Secondary | ICD-10-CM

## 2012-01-08 DIAGNOSIS — Z7901 Long term (current) use of anticoagulants: Secondary | ICD-10-CM

## 2012-01-08 LAB — POCT INR: INR: 2.8

## 2012-01-29 ENCOUNTER — Ambulatory Visit (INDEPENDENT_AMBULATORY_CARE_PROVIDER_SITE_OTHER): Payer: Medicare Other | Admitting: *Deleted

## 2012-01-29 DIAGNOSIS — Z7901 Long term (current) use of anticoagulants: Secondary | ICD-10-CM

## 2012-01-29 DIAGNOSIS — I4891 Unspecified atrial fibrillation: Secondary | ICD-10-CM

## 2012-01-29 LAB — POCT INR: INR: 2.3

## 2012-02-19 ENCOUNTER — Ambulatory Visit (INDEPENDENT_AMBULATORY_CARE_PROVIDER_SITE_OTHER): Payer: Medicare Other

## 2012-02-19 DIAGNOSIS — I4891 Unspecified atrial fibrillation: Secondary | ICD-10-CM

## 2012-02-19 DIAGNOSIS — Z7901 Long term (current) use of anticoagulants: Secondary | ICD-10-CM

## 2012-03-14 ENCOUNTER — Other Ambulatory Visit: Payer: Self-pay

## 2012-03-14 MED ORDER — WARFARIN SODIUM 2 MG PO TABS: 2.0000 mg | ORAL_TABLET | ORAL | Status: DC

## 2012-03-18 ENCOUNTER — Ambulatory Visit (INDEPENDENT_AMBULATORY_CARE_PROVIDER_SITE_OTHER): Payer: Medicare Other | Admitting: *Deleted

## 2012-03-18 DIAGNOSIS — Z7901 Long term (current) use of anticoagulants: Secondary | ICD-10-CM

## 2012-03-18 DIAGNOSIS — I4891 Unspecified atrial fibrillation: Secondary | ICD-10-CM

## 2012-04-15 ENCOUNTER — Ambulatory Visit (INDEPENDENT_AMBULATORY_CARE_PROVIDER_SITE_OTHER): Payer: Medicare Other | Admitting: *Deleted

## 2012-04-15 DIAGNOSIS — I4891 Unspecified atrial fibrillation: Secondary | ICD-10-CM

## 2012-04-15 DIAGNOSIS — Z7901 Long term (current) use of anticoagulants: Secondary | ICD-10-CM

## 2012-05-13 ENCOUNTER — Ambulatory Visit (INDEPENDENT_AMBULATORY_CARE_PROVIDER_SITE_OTHER): Payer: Medicare Other | Admitting: *Deleted

## 2012-05-13 DIAGNOSIS — I4891 Unspecified atrial fibrillation: Secondary | ICD-10-CM

## 2012-05-13 DIAGNOSIS — Z7901 Long term (current) use of anticoagulants: Secondary | ICD-10-CM

## 2012-05-13 LAB — POCT INR: INR: 2.8

## 2012-06-05 ENCOUNTER — Ambulatory Visit (INDEPENDENT_AMBULATORY_CARE_PROVIDER_SITE_OTHER): Payer: Medicare Other | Admitting: Cardiology

## 2012-06-05 ENCOUNTER — Encounter: Payer: Self-pay | Admitting: Cardiology

## 2012-06-05 VITALS — BP 150/72 | HR 72 | Ht 69.5 in

## 2012-06-05 DIAGNOSIS — I4891 Unspecified atrial fibrillation: Secondary | ICD-10-CM

## 2012-06-05 DIAGNOSIS — Z7901 Long term (current) use of anticoagulants: Secondary | ICD-10-CM

## 2012-06-05 MED ORDER — AMIODARONE HCL 200 MG PO TABS: 100.0000 mg | ORAL_TABLET | Freq: Every day | ORAL | Status: DC

## 2012-06-05 NOTE — Patient Instructions (Signed)
Continue your current therapy  I will see you in 6 months.   

## 2012-06-05 NOTE — Progress Notes (Signed)
[name] [name] Date of Birth: [birth year] Medical Record #[id number]  History of Present Illness: [name] [name] is seen today for a follow up visit.  He has atrial fib and has been on chronic amiodarone. Other issues include PAD, DVT, remote cerebral aneurysm, DJD, and past amputations of the left leg. He remains on chronic coumadin therapy.   since his last visit he states he has done well. He denies any palpitations, chest pain, or shortness of breath. Unfortunately he lost his wife to recurrent cancer.  Current Outpatient Prescriptions on File Prior to Visit  Medication Sig Dispense Refill  . Multiple Vitamin (MULTIVITAMIN PO) Take by mouth as needed.        . warfarin (COUMADIN) 2 MG tablet Take 1 tablet (2 mg total) by mouth as directed.  30 tablet  3   No current facility-administered medications on file prior to visit.    No Known Allergies  Past Medical History  Diagnosis Date  . A-fib   . Peripheral arterial disease   . DVT (deep venous thrombosis)     history of  . Cerebral aneurysm     history of cerebral hemorrhage due to brain aneurysm rupture 25 yrs ago  . DJD (degenerative joint disease)   . Urethral stricture     history of    Past Surgical History  Procedure Laterality Date  . Varicose vein surgery    . Appendectomy    . Prostatectomy    . Tonsillectomy and adenoidectomy    . Above knee leg amputation      left    History  Smoking status  . Never Smoker   Smokeless tobacco  . Not on file    History  Alcohol Use No    History reviewed. No pertinent family history.  Review of Systems: The review of systems is per the HPI.  All other systems were reviewed and are negative.  Physical Exam: BP 150/72  Pulse 72  Ht 5' 9.5" (1.765 m)  SpO2 96% He is not able to weigh.  Patient is very pleasant and in no acute distress. Skin is warm and dry. Color is normal.  HEENT is unremarkable. Normocephalic/atraumatic. PERRL. Sclera are nonicteric.  No JVD. Lungs are  clear. Cardiac exam shows a regular rhythm. Abdomen is soft. Extremities are without edema. Gait is not tested. Left AKA. ROM appears intact. No gross neurologic deficits noted.  LABORATORY DATA:    Lab Results  Component Value Date   WBC 6.1 12/04/2011   HGB 13.5 12/04/2011   HCT 41.5 12/04/2011   PLT 162.0 12/04/2011   GLUCOSE 94 12/04/2011   ALT 32 12/04/2011   AST 28 12/04/2011   NA 139 12/04/2011   K 3.6 12/04/2011   CL 107 12/04/2011   CREATININE 0.9 12/04/2011   BUN 17 12/04/2011   CO2 26 12/04/2011   TSH 2.25 12/04/2011   INR 2.8 05/13/2012   HGBA1C  Value: 5.9 (NOTE) The ADA recommends the following therapeutic goal for glycemic control related to Hgb A1c measurement: Goal of therapy: <6.5 Hgb A1c  Reference: American Diabetes Association: Clinical Practice Recommendations 2010, Diabetes Care, 2010, 33: (Suppl  1). 09/29/2008   Lab Results  Component Value Date   INR 2.8 05/13/2012   INR 3.0 04/15/2012   INR 1.9 03/18/2012    Assessment / Plan:  1. PAF -  maintaining sinus rhythm on amiodarone. We'll followup in 6 months and plan on checking lab work at that time.   2. Chronic  coumadin therapy - no problems noted.  INRs have been therapeutic  3. PAD - past leg amputation.

## 2012-06-17 ENCOUNTER — Ambulatory Visit (INDEPENDENT_AMBULATORY_CARE_PROVIDER_SITE_OTHER): Payer: Medicare Other | Admitting: *Deleted

## 2012-06-17 DIAGNOSIS — Z7901 Long term (current) use of anticoagulants: Secondary | ICD-10-CM

## 2012-06-17 DIAGNOSIS — I4891 Unspecified atrial fibrillation: Secondary | ICD-10-CM

## 2012-07-09 ENCOUNTER — Other Ambulatory Visit: Payer: Self-pay

## 2012-07-09 MED ORDER — WARFARIN SODIUM 2 MG PO TABS: 2.0000 mg | ORAL_TABLET | ORAL | Status: DC

## 2012-07-15 ENCOUNTER — Ambulatory Visit (INDEPENDENT_AMBULATORY_CARE_PROVIDER_SITE_OTHER): Payer: Medicare Other | Admitting: *Deleted

## 2012-07-15 DIAGNOSIS — Z7901 Long term (current) use of anticoagulants: Secondary | ICD-10-CM

## 2012-07-15 DIAGNOSIS — I4891 Unspecified atrial fibrillation: Secondary | ICD-10-CM

## 2012-08-12 ENCOUNTER — Ambulatory Visit (INDEPENDENT_AMBULATORY_CARE_PROVIDER_SITE_OTHER): Payer: Medicare Other | Admitting: *Deleted

## 2012-08-12 DIAGNOSIS — I4891 Unspecified atrial fibrillation: Secondary | ICD-10-CM

## 2012-08-12 DIAGNOSIS — Z7901 Long term (current) use of anticoagulants: Secondary | ICD-10-CM

## 2012-08-12 LAB — POCT INR: INR: 2.4

## 2012-09-09 ENCOUNTER — Ambulatory Visit (INDEPENDENT_AMBULATORY_CARE_PROVIDER_SITE_OTHER): Payer: Medicare Other | Admitting: *Deleted

## 2012-09-09 DIAGNOSIS — I4891 Unspecified atrial fibrillation: Secondary | ICD-10-CM

## 2012-09-09 DIAGNOSIS — Z7901 Long term (current) use of anticoagulants: Secondary | ICD-10-CM

## 2012-09-09 LAB — POCT INR: INR: 3

## 2012-10-07 ENCOUNTER — Ambulatory Visit (INDEPENDENT_AMBULATORY_CARE_PROVIDER_SITE_OTHER): Payer: Medicare Other | Admitting: Pharmacist

## 2012-10-07 DIAGNOSIS — I4891 Unspecified atrial fibrillation: Secondary | ICD-10-CM

## 2012-10-07 DIAGNOSIS — Z7901 Long term (current) use of anticoagulants: Secondary | ICD-10-CM

## 2012-10-07 LAB — POCT INR: INR: 2.7

## 2012-11-18 ENCOUNTER — Ambulatory Visit (INDEPENDENT_AMBULATORY_CARE_PROVIDER_SITE_OTHER): Payer: Medicare Other | Admitting: *Deleted

## 2012-11-18 DIAGNOSIS — I4891 Unspecified atrial fibrillation: Secondary | ICD-10-CM

## 2012-11-18 DIAGNOSIS — Z7901 Long term (current) use of anticoagulants: Secondary | ICD-10-CM

## 2012-12-03 ENCOUNTER — Encounter: Payer: Self-pay | Admitting: Cardiology

## 2012-12-03 ENCOUNTER — Ambulatory Visit (INDEPENDENT_AMBULATORY_CARE_PROVIDER_SITE_OTHER): Payer: Medicare Other | Admitting: Cardiology

## 2012-12-03 VITALS — BP 142/60 | HR 62 | Ht 69.5 in

## 2012-12-03 DIAGNOSIS — Z7901 Long term (current) use of anticoagulants: Secondary | ICD-10-CM

## 2012-12-03 DIAGNOSIS — I4891 Unspecified atrial fibrillation: Secondary | ICD-10-CM

## 2012-12-03 LAB — HEPATIC FUNCTION PANEL
ALT: 25 U/L (ref 0–53)
Albumin: 3.6 g/dL (ref 3.5–5.2)
Alkaline Phosphatase: 46 U/L (ref 39–117)
Total Protein: 7.3 g/dL (ref 6.0–8.3)

## 2012-12-03 LAB — CBC WITH DIFFERENTIAL/PLATELET
Basophils Absolute: 0 10*3/uL (ref 0.0–0.1)
Eosinophils Absolute: 0.2 10*3/uL (ref 0.0–0.7)
Lymphocytes Relative: 21.8 % (ref 12.0–46.0)
MCHC: 33.8 g/dL (ref 30.0–36.0)
MCV: 83.6 fl (ref 78.0–100.0)
Monocytes Absolute: 0.9 10*3/uL (ref 0.1–1.0)
Neutrophils Relative %: 61.6 % (ref 43.0–77.0)
Platelets: 168 10*3/uL (ref 150.0–400.0)
RBC: 4.96 Mil/uL (ref 4.22–5.81)

## 2012-12-03 LAB — BASIC METABOLIC PANEL
CO2: 27 mEq/L (ref 19–32)
Chloride: 104 mEq/L (ref 96–112)
Creatinine, Ser: 0.9 mg/dL (ref 0.4–1.5)
GFR: 84.22 mL/min (ref >60.00)
Potassium: 3.9 mEq/L (ref 3.5–5.1)

## 2012-12-03 LAB — TSH: TSH: 1.76 u[IU]/mL (ref 0.35–5.50)

## 2012-12-03 NOTE — Patient Instructions (Signed)
We will check lab work today  Continue your current therapy  I will see you in 6 months.   

## 2012-12-03 NOTE — Progress Notes (Signed)
[name] Date of Birth: 09-Mar-1926 Medical Record #[id number]  History of Present Illness: [name] [name] is seen today for a follow up visit.  He has atrial fib and has been on chronic amiodarone. Other issues include PAD, DVT, remote cerebral aneurysm, DJD, and past amputations of the left leg. He remains on chronic coumadin therapy.   on followup today he appears to be doing well. He is adjusting to being without his wife. He denies any increase shortness of breath, dizziness, or chest pain. Overall he feels well.  Current Outpatient Prescriptions on File Prior to Visit  Medication Sig Dispense Refill  . amiodarone (PACERONE) 200 MG tablet Take 0.5 tablets (100 mg total) by mouth daily.  30 tablet  6  . Multiple Vitamin (MULTIVITAMIN PO) Take by mouth as needed.        . warfarin (COUMADIN) 2 MG tablet Take 1 tablet (2 mg total) by mouth as directed.  30 tablet  3   No current facility-administered medications on file prior to visit.    No Known Allergies  Past Medical History  Diagnosis Date  . A-fib   . Peripheral arterial disease   . DVT (deep venous thrombosis)     history of  . Cerebral aneurysm     history of cerebral hemorrhage due to brain aneurysm rupture 25 yrs ago  . DJD (degenerative joint disease)   . Urethral stricture     history of    Past Surgical History  Procedure Laterality Date  . Varicose vein surgery    . Appendectomy    . Prostatectomy    . Tonsillectomy and adenoidectomy    . Above knee leg amputation      left    History  Smoking status  . Never Smoker   Smokeless tobacco  . Not on file    History  Alcohol Use No    History reviewed. No pertinent family history.  Review of Systems: The review of systems is per the HPI.  All other systems were reviewed and are negative.  Physical Exam: BP 142/60  Pulse 62  Ht 5' 9.5" (1.765 m) He is not able to weigh.  Patient is very pleasant and in no acute distress. Skin is warm and dry. Color is  normal.  HEENT is unremarkable. Normocephalic/atraumatic. PERRL. Sclera are nonicteric.  No JVD. Lungs are clear. Cardiac exam shows a regular rhythm. Abdomen is soft. Extremities are without edema. Gait is not tested. Left AKA. ROM appears intact. No gross neurologic deficits noted.  LABORATORY DATA:    Lab Results  Component Value Date   WBC 6.1 12/04/2011   HGB 13.5 12/04/2011   HCT 41.5 12/04/2011   PLT 162.0 12/04/2011   GLUCOSE 94 12/04/2011   ALT 32 12/04/2011   AST 28 12/04/2011   NA 139 12/04/2011   K 3.6 12/04/2011   CL 107 12/04/2011   CREATININE 0.9 12/04/2011   BUN 17 12/04/2011   CO2 26 12/04/2011   TSH 2.25 12/04/2011   INR 2.4 11/18/2012   HGBA1C  Value: 5.9 (NOTE) The ADA recommends the following therapeutic goal for glycemic control related to Hgb A1c measurement: Goal of therapy: <6.5 Hgb A1c  Reference: American Diabetes Association: Clinical Practice Recommendations 2010, Diabetes Care, 2010, 33: (Suppl  1). 09/29/2008   Lab Results  Component Value Date   INR 2.4 11/18/2012   INR 2.7 10/07/2012   INR 3.0 09/09/2012   ECG today demonstrates normal sinus rhythm with PACs. There is  low voltage. Cannot rule out anterior and inferior infarcts, old. It is difficult to see his P waves because of baseline variation but I think this is most likely sinus rhythm.  Assessment / Plan:  1. PAF -  maintaining sinus rhythm on amiodarone. We will check lab work today including complete chemistry panel, TSH, and CBC.  2. Chronic coumadin therapy - no problems noted.  INRs have been therapeutic  3. PAD - past leg amputation.

## 2012-12-08 ENCOUNTER — Telehealth: Payer: Self-pay | Admitting: Cardiovascular Disease

## 2012-12-08 NOTE — Telephone Encounter (Signed)
Returned call to patient lab results given. 

## 2012-12-08 NOTE — Telephone Encounter (Signed)
New problem   Pt stated someone called to give him concerning lab results. He stated that over ever called could you please speak a little louder,.

## 2012-12-13 ENCOUNTER — Other Ambulatory Visit: Payer: Self-pay | Admitting: Cardiology

## 2012-12-30 ENCOUNTER — Ambulatory Visit (INDEPENDENT_AMBULATORY_CARE_PROVIDER_SITE_OTHER): Payer: Medicare Other | Admitting: *Deleted

## 2012-12-30 DIAGNOSIS — Z7901 Long term (current) use of anticoagulants: Secondary | ICD-10-CM

## 2012-12-30 DIAGNOSIS — I4891 Unspecified atrial fibrillation: Secondary | ICD-10-CM

## 2013-02-10 ENCOUNTER — Ambulatory Visit (INDEPENDENT_AMBULATORY_CARE_PROVIDER_SITE_OTHER): Payer: Medicare Other | Admitting: *Deleted

## 2013-02-10 DIAGNOSIS — Z7901 Long term (current) use of anticoagulants: Secondary | ICD-10-CM

## 2013-02-10 DIAGNOSIS — I4891 Unspecified atrial fibrillation: Secondary | ICD-10-CM

## 2013-02-10 LAB — POCT INR: INR: 3.5

## 2013-03-03 ENCOUNTER — Ambulatory Visit (INDEPENDENT_AMBULATORY_CARE_PROVIDER_SITE_OTHER): Payer: Medicare Other | Admitting: *Deleted

## 2013-03-03 DIAGNOSIS — Z7901 Long term (current) use of anticoagulants: Secondary | ICD-10-CM

## 2013-03-03 DIAGNOSIS — I4891 Unspecified atrial fibrillation: Secondary | ICD-10-CM

## 2013-03-03 LAB — POCT INR: INR: 1.8

## 2013-03-24 ENCOUNTER — Ambulatory Visit (INDEPENDENT_AMBULATORY_CARE_PROVIDER_SITE_OTHER): Payer: Medicare Other | Admitting: Pharmacist

## 2013-03-24 DIAGNOSIS — Z7901 Long term (current) use of anticoagulants: Secondary | ICD-10-CM

## 2013-03-24 DIAGNOSIS — I4891 Unspecified atrial fibrillation: Secondary | ICD-10-CM

## 2013-03-24 LAB — POCT INR: INR: 2.2

## 2013-04-03 ENCOUNTER — Other Ambulatory Visit: Payer: Self-pay | Admitting: Cardiology

## 2013-04-21 ENCOUNTER — Ambulatory Visit (INDEPENDENT_AMBULATORY_CARE_PROVIDER_SITE_OTHER): Payer: Medicare Other | Admitting: *Deleted

## 2013-04-21 DIAGNOSIS — Z7901 Long term (current) use of anticoagulants: Secondary | ICD-10-CM

## 2013-04-21 DIAGNOSIS — I4891 Unspecified atrial fibrillation: Secondary | ICD-10-CM

## 2013-04-21 LAB — POCT INR: INR: 2.7

## 2013-05-13 ENCOUNTER — Encounter (HOSPITAL_COMMUNITY)
Admission: EM | Disposition: A | Discharge status: Skilled Nursing Facility | Payer: Self-pay | Source: Home / Self Care | Attending: Neurosurgery

## 2013-05-13 ENCOUNTER — Inpatient Hospital Stay (HOSPITAL_COMMUNITY)
Admission: EM | Admit: 2013-05-13 | Discharge: 2013-05-23 | DRG: 024 | Disposition: A | Discharge status: Skilled Nursing Facility | Payer: Medicare Other | Attending: Neurosurgery | Admitting: Neurosurgery

## 2013-05-13 ENCOUNTER — Ambulatory Visit (INDEPENDENT_AMBULATORY_CARE_PROVIDER_SITE_OTHER): Payer: BLUE CROSS/BLUE SHIELD | Admitting: Family Medicine

## 2013-05-13 ENCOUNTER — Encounter (HOSPITAL_COMMUNITY): Payer: Medicare Other | Admitting: Anesthesiology

## 2013-05-13 ENCOUNTER — Emergency Department (HOSPITAL_COMMUNITY): Payer: Medicare Other | Admitting: Anesthesiology

## 2013-05-13 ENCOUNTER — Emergency Department (HOSPITAL_COMMUNITY): Payer: Medicare Other

## 2013-05-13 VITALS — BP 120/60 | HR 76 | Temp 97.6°F | Resp 16

## 2013-05-13 DIAGNOSIS — R29898 Other symptoms and signs involving the musculoskeletal system: Secondary | ICD-10-CM

## 2013-05-13 DIAGNOSIS — R5381 Other malaise: Secondary | ICD-10-CM

## 2013-05-13 DIAGNOSIS — I4891 Unspecified atrial fibrillation: Secondary | ICD-10-CM | POA: Diagnosis present

## 2013-05-13 DIAGNOSIS — I614 Nontraumatic intracerebral hemorrhage in cerebellum: Secondary | ICD-10-CM | POA: Diagnosis present

## 2013-05-13 DIAGNOSIS — I619 Nontraumatic intracerebral hemorrhage, unspecified: Principal | ICD-10-CM | POA: Diagnosis present

## 2013-05-13 DIAGNOSIS — R42 Dizziness and giddiness: Secondary | ICD-10-CM

## 2013-05-13 DIAGNOSIS — R6889 Other general symptoms and signs: Secondary | ICD-10-CM

## 2013-05-13 DIAGNOSIS — I739 Peripheral vascular disease, unspecified: Secondary | ICD-10-CM

## 2013-05-13 DIAGNOSIS — R5383 Other fatigue: Secondary | ICD-10-CM

## 2013-05-13 DIAGNOSIS — Z7901 Long term (current) use of anticoagulants: Secondary | ICD-10-CM

## 2013-05-13 DIAGNOSIS — R410 Disorientation, unspecified: Secondary | ICD-10-CM

## 2013-05-13 HISTORY — PX: SUBOCCIPITAL CRANIECTOMY CERVICAL LAMINECTOMY: SHX5404

## 2013-05-13 LAB — CBC WITH DIFFERENTIAL/PLATELET
BASOS ABS: 0 10*3/uL (ref 0.0–0.1)
BASOS PCT: 0 % (ref 0–1)
EOS ABS: 0 10*3/uL (ref 0.0–0.7)
EOS PCT: 0 % (ref 0–5)
HEMATOCRIT: 42.2 % (ref 39.0–52.0)
HEMOGLOBIN: 14 g/dL (ref 13.0–17.0)
Lymphocytes Relative: 15 % (ref 12–46)
Lymphs Abs: 1.6 10*3/uL (ref 0.7–4.0)
MCH: 28.5 pg (ref 26.0–34.0)
MCHC: 33.2 g/dL (ref 30.0–36.0)
MCV: 85.8 fL (ref 78.0–100.0)
MONOS PCT: 11 % (ref 3–12)
Monocytes Absolute: 1.2 10*3/uL — ABNORMAL HIGH (ref 0.1–1.0)
NEUTROS ABS: 8.3 10*3/uL — AB (ref 1.7–7.7)
Neutrophils Relative %: 74 % (ref 43–77)
Platelets: 190 10*3/uL (ref 150–400)
RBC: 4.92 MIL/uL (ref 4.22–5.81)
RDW: 14.2 % (ref 11.5–15.5)
WBC: 11.2 10*3/uL — ABNORMAL HIGH (ref 4.0–10.5)

## 2013-05-13 LAB — URINALYSIS, ROUTINE W REFLEX MICROSCOPIC
BILIRUBIN URINE: NEGATIVE
Glucose, UA: NEGATIVE mg/dL
KETONES UR: NEGATIVE mg/dL
Nitrite: POSITIVE — AB
PH: 6 (ref 5.0–8.0)
Protein, ur: 30 mg/dL — AB
Specific Gravity, Urine: 1.016 (ref 1.005–1.030)
Urobilinogen, UA: 0.2 mg/dL (ref 0.0–1.0)

## 2013-05-13 LAB — POCT CBC
Granulocyte percent: 72.1 %G (ref 37–80)
HCT, POC: 46.6 % (ref 43.5–53.7)
Hemoglobin: 14.6 g/dL (ref 14.1–18.1)
LYMPH, POC: 1.9 (ref 0.6–3.4)
MCH, POC: 27.8 pg (ref 27–31.2)
MCHC: 31.3 g/dL — AB (ref 31.8–35.4)
MCV: 88.8 fL (ref 80–97)
MID (cbc): 0.9 (ref 0–0.9)
MPV: 10.2 fL (ref 0–99.8)
POC GRANULOCYTE: 7.3 — AB (ref 2–6.9)
POC LYMPH %: 18.6 % (ref 10–50)
POC MID %: 9.3 % (ref 0–12)
Platelet Count, POC: 191 10*3/uL (ref 142–424)
RBC: 5.25 M/uL (ref 4.69–6.13)
RDW, POC: 14.3 %
WBC: 10.1 10*3/uL (ref 4.6–10.2)

## 2013-05-13 LAB — PROTIME-INR
INR: 2.43 — ABNORMAL HIGH (ref 0.00–1.49)
INR: 1.25 (ref 0.00–1.49)
Prothrombin Time: 25.6 seconds — ABNORMAL HIGH (ref 11.6–15.2)
Prothrombin Time: 15.4 seconds — ABNORMAL HIGH (ref 11.6–15.2)

## 2013-05-13 LAB — URINE MICROSCOPIC-ADD ON

## 2013-05-13 LAB — BASIC METABOLIC PANEL
BUN: 22 mg/dL (ref 6–23)
CO2: 25 mEq/L (ref 19–32)
CREATININE: 0.89 mg/dL (ref 0.50–1.35)
Calcium: 8.8 mg/dL (ref 8.4–10.5)
Chloride: 102 mEq/L (ref 96–112)
GFR calc Af Amer: 88 mL/min — ABNORMAL LOW (ref >90)
GFR, EST NON AFRICAN AMERICAN: 76 mL/min — AB (ref >90)
Glucose, Bld: 103 mg/dL — ABNORMAL HIGH (ref 70–99)
Potassium: 3.9 mEq/L (ref 3.7–5.3)
Sodium: 140 mEq/L (ref 137–147)

## 2013-05-13 LAB — APTT: aPTT: 30 seconds (ref 24–37)

## 2013-05-13 LAB — ABO/RH: ABO/RH(D): O POS

## 2013-05-13 LAB — GLUCOSE, POCT (MANUAL RESULT ENTRY): POC GLUCOSE: 107 mg/dL — AB (ref 70–99)

## 2013-05-13 SURGERY — SUBOCCIPITAL CRANIECTOMY CERVICAL LAMINECTOMY/DURAPLASTY
Anesthesia: General | Site: Head | Laterality: Left

## 2013-05-13 MED ORDER — FENTANYL CITRATE 0.05 MG/ML IJ SOLN
25.0000 ug | INTRAMUSCULAR | Status: DC | PRN
  Administered 2013-05-13 – 2013-05-14 (×2): 50 ug via INTRAVENOUS

## 2013-05-13 MED ORDER — FENTANYL CITRATE 0.05 MG/ML IJ SOLN
INTRAMUSCULAR | Status: DC | PRN
  Administered 2013-05-13: 50 ug via INTRAVENOUS
  Administered 2013-05-13: 100 ug via INTRAVENOUS
  Administered 2013-05-13 (×2): 50 ug via INTRAVENOUS

## 2013-05-13 MED ORDER — SODIUM CHLORIDE 0.9 % IV SOLN
INTRAVENOUS | Status: DC | PRN
  Administered 2013-05-13 (×2): via INTRAVENOUS

## 2013-05-13 MED ORDER — GELATIN ABSORBABLE 12-7 MM EX MISC
CUTANEOUS | Status: DC | PRN
  Administered 2013-05-13: 1

## 2013-05-13 MED ORDER — ONDANSETRON HCL 4 MG/2ML IJ SOLN
INTRAMUSCULAR | Status: DC | PRN
  Administered 2013-05-13: 4 mg via INTRAVENOUS

## 2013-05-13 MED ORDER — DEXTROSE 5 % IV SOLN
10.0000 mg | Freq: Once | INTRAVENOUS | Status: AC
  Administered 2013-05-13: 10 mg via INTRAVENOUS
  Filled 2013-05-13: qty 1

## 2013-05-13 MED ORDER — FENTANYL CITRATE 0.05 MG/ML IJ SOLN
INTRAMUSCULAR | Status: AC
  Administered 2013-05-13: 50 ug via INTRAVENOUS
  Filled 2013-05-13: qty 2

## 2013-05-13 MED ORDER — VECURONIUM BROMIDE 10 MG IV SOLR
INTRAVENOUS | Status: DC | PRN
  Administered 2013-05-13: 1 mg via INTRAVENOUS
  Administered 2013-05-13: 9 mg via INTRAVENOUS

## 2013-05-13 MED ORDER — THROMBIN 20000 UNITS EX KIT
PACK | CUTANEOUS | Status: DC | PRN
  Administered 2013-05-13: 22:00:00 via TOPICAL

## 2013-05-13 MED ORDER — SUCCINYLCHOLINE CHLORIDE 20 MG/ML IJ SOLN
INTRAMUSCULAR | Status: AC
  Filled 2013-05-13: qty 1

## 2013-05-13 MED ORDER — 0.9 % SODIUM CHLORIDE (POUR BTL) OPTIME
TOPICAL | Status: DC | PRN
  Administered 2013-05-13: 1000 mL

## 2013-05-13 MED ORDER — NEOSTIGMINE METHYLSULFATE 1 MG/ML IJ SOLN
INTRAMUSCULAR | Status: AC
  Filled 2013-05-13: qty 10

## 2013-05-13 MED ORDER — NEOSTIGMINE METHYLSULFATE 1 MG/ML IJ SOLN
INTRAMUSCULAR | Status: DC | PRN
  Administered 2013-05-13: 4 mg via INTRAVENOUS

## 2013-05-13 MED ORDER — PHENYLEPHRINE HCL 10 MG/ML IJ SOLN
INTRAMUSCULAR | Status: DC | PRN
  Administered 2013-05-13: 40 ug via INTRAVENOUS

## 2013-05-13 MED ORDER — BUPIVACAINE-EPINEPHRINE 0.5% -1:200000 IJ SOLN
INTRAMUSCULAR | Status: DC | PRN
  Administered 2013-05-13: 10 mL

## 2013-05-13 MED ORDER — SODIUM CHLORIDE 0.9 % IV SOLN
INTRAVENOUS | Status: DC | PRN
  Administered 2013-05-13 (×2): via INTRAVENOUS

## 2013-05-13 MED ORDER — PROPOFOL 10 MG/ML IV BOLUS
INTRAVENOUS | Status: AC
  Filled 2013-05-13: qty 20

## 2013-05-13 MED ORDER — FENTANYL CITRATE 0.05 MG/ML IJ SOLN
INTRAMUSCULAR | Status: AC
  Administered 2013-05-14: 50 ug via INTRAVENOUS
  Filled 2013-05-13: qty 2

## 2013-05-13 MED ORDER — BACITRACIN ZINC 500 UNIT/GM EX OINT
TOPICAL_OINTMENT | CUTANEOUS | Status: DC | PRN
  Administered 2013-05-13: 1 via TOPICAL

## 2013-05-13 MED ORDER — ARTIFICIAL TEARS OP OINT
TOPICAL_OINTMENT | OPHTHALMIC | Status: AC
  Filled 2013-05-13: qty 3.5

## 2013-05-13 MED ORDER — ONDANSETRON HCL 4 MG/2ML IJ SOLN
INTRAMUSCULAR | Status: AC
  Administered 2013-05-14: 4 mg via INTRAVENOUS
  Filled 2013-05-13: qty 2

## 2013-05-13 MED ORDER — SODIUM CHLORIDE 0.9 % IR SOLN
Status: DC | PRN
  Administered 2013-05-13: 22:00:00

## 2013-05-13 MED ORDER — MICROFIBRILLAR COLL HEMOSTAT EX POWD
CUTANEOUS | Status: DC | PRN
  Administered 2013-05-13: 5 g via TOPICAL

## 2013-05-13 MED ORDER — EPHEDRINE SULFATE 50 MG/ML IJ SOLN
INTRAMUSCULAR | Status: DC | PRN
  Administered 2013-05-13 (×3): 10 mg via INTRAVENOUS

## 2013-05-13 MED ORDER — PROTHROMBIN COMPLEX CONC HUMAN 500 UNITS IV KIT
2152.0000 [IU] | PACK | Status: AC
  Administered 2013-05-13: 2152 [IU] via INTRAVENOUS
  Filled 2013-05-13: qty 86

## 2013-05-13 MED ORDER — LABETALOL HCL 5 MG/ML IV SOLN
INTRAVENOUS | Status: DC | PRN
  Administered 2013-05-13 (×2): 2.5 mg via INTRAVENOUS

## 2013-05-13 MED ORDER — LIDOCAINE HCL (CARDIAC) 20 MG/ML IV SOLN
INTRAVENOUS | Status: DC | PRN
  Administered 2013-05-13: 60 mg via INTRAVENOUS

## 2013-05-13 MED ORDER — PHENYLEPHRINE HCL 10 MG/ML IJ SOLN
10.0000 mg | INTRAMUSCULAR | Status: DC | PRN
  Administered 2013-05-13: 25 ug/min via INTRAVENOUS

## 2013-05-13 MED ORDER — FENTANYL CITRATE 0.05 MG/ML IJ SOLN
INTRAMUSCULAR | Status: AC
  Filled 2013-05-13: qty 5

## 2013-05-13 MED ORDER — STERILE WATER FOR INJECTION IJ SOLN
INTRAMUSCULAR | Status: AC
  Filled 2013-05-13: qty 10

## 2013-05-13 MED ORDER — MICROFIBRILLAR COLL HEMOSTAT EX PADS
MEDICATED_PAD | CUTANEOUS | Status: DC | PRN
  Administered 2013-05-13: 1 via TOPICAL

## 2013-05-13 MED ORDER — VECURONIUM BROMIDE 10 MG IV SOLR
INTRAVENOUS | Status: AC
  Filled 2013-05-13: qty 10

## 2013-05-13 MED ORDER — ARTIFICIAL TEARS OP OINT
TOPICAL_OINTMENT | OPHTHALMIC | Status: DC | PRN
  Administered 2013-05-13: 1 via OPHTHALMIC

## 2013-05-13 MED ORDER — GLYCOPYRROLATE 0.2 MG/ML IJ SOLN
INTRAMUSCULAR | Status: DC | PRN
  Administered 2013-05-13: 0.6 mg via INTRAVENOUS

## 2013-05-13 MED ORDER — LIDOCAINE HCL (CARDIAC) 20 MG/ML IV SOLN
INTRAVENOUS | Status: AC
  Filled 2013-05-13: qty 5

## 2013-05-13 MED ORDER — PHENYLEPHRINE 40 MCG/ML (10ML) SYRINGE FOR IV PUSH (FOR BLOOD PRESSURE SUPPORT)
PREFILLED_SYRINGE | INTRAVENOUS | Status: AC
  Filled 2013-05-13: qty 10

## 2013-05-13 MED ORDER — CEFAZOLIN SODIUM-DEXTROSE 2-3 GM-% IV SOLR
INTRAVENOUS | Status: DC | PRN
  Administered 2013-05-13: 2 g via INTRAVENOUS

## 2013-05-13 MED ORDER — PROPOFOL 10 MG/ML IV BOLUS
INTRAVENOUS | Status: DC | PRN
  Administered 2013-05-13: 100 mg via INTRAVENOUS
  Administered 2013-05-13: 50 mg via INTRAVENOUS
  Administered 2013-05-13: 20 mg via INTRAVENOUS

## 2013-05-13 MED ORDER — CEFAZOLIN SODIUM-DEXTROSE 2-3 GM-% IV SOLR
INTRAVENOUS | Status: AC
  Filled 2013-05-13: qty 50

## 2013-05-13 MED ORDER — ONDANSETRON HCL 4 MG/2ML IJ SOLN
4.0000 mg | Freq: Once | INTRAMUSCULAR | Status: DC | PRN
  Administered 2013-05-14: 4 mg via INTRAVENOUS

## 2013-05-13 MED ORDER — GLYCOPYRROLATE 0.2 MG/ML IJ SOLN
INTRAMUSCULAR | Status: AC
  Filled 2013-05-13: qty 4

## 2013-05-13 SURGICAL SUPPLY — 74 items
APL SKNCLS STERI-STRIP NONHPOA (GAUZE/BANDAGES/DRESSINGS)
BAG DECANTER FOR FLEXI CONT (MISCELLANEOUS) ×3 IMPLANT
BENZOIN TINCTURE PRP APPL 2/3 (GAUZE/BANDAGES/DRESSINGS) IMPLANT
BLADE CLIPPER SURG NEURO (BLADE) ×6 IMPLANT
BLADE ULTRA TIP 2M (BLADE) IMPLANT
BRUSH SCRUB EZ 1% IODOPHOR (MISCELLANEOUS) ×3 IMPLANT
BRUSH SCRUB EZ PLAIN DRY (MISCELLANEOUS) ×3 IMPLANT
BUR ACORN 6.0 PRECISION (BURR) ×2 IMPLANT
BUR ACORN 6.0MM PRECISION (BURR) ×1
BUR MATCHSTICK NEURO 3.0 LAGG (BURR) ×1 IMPLANT
CANISTER SUCT 3000ML (MISCELLANEOUS) ×3 IMPLANT
CLIP TI MEDIUM 6 (CLIP) IMPLANT
CONT SPEC 4OZ CLIKSEAL STRL BL (MISCELLANEOUS) ×3 IMPLANT
CORDS BIPOLAR (ELECTRODE) ×3 IMPLANT
COVER MAYO STAND STRL (DRAPES) ×3 IMPLANT
DRAIN SNY WOU 7FLT (WOUND CARE) IMPLANT
DRAPE LAPAROTOMY 100X72 PEDS (DRAPES) ×3 IMPLANT
DRAPE MICROSCOPE ZEISS OPMI (DRAPES) IMPLANT
DRAPE SURG 17X23 STRL (DRAPES) ×6 IMPLANT
DRAPE WARM FLUID 44X44 (DRAPE) ×3 IMPLANT
DRESSING TELFA 8X3 (GAUZE/BANDAGES/DRESSINGS) ×3 IMPLANT
ELECT BLADE 4.0 EZ CLEAN MEGAD (MISCELLANEOUS) ×3
ELECT CAUTERY BLADE 6.4 (BLADE) ×3 IMPLANT
ELECT REM PT RETURN 9FT ADLT (ELECTROSURGICAL) ×3
ELECTRODE BLDE 4.0 EZ CLN MEGD (MISCELLANEOUS) ×1 IMPLANT
ELECTRODE REM PT RTRN 9FT ADLT (ELECTROSURGICAL) ×1 IMPLANT
EVACUATOR 1/8 PVC DRAIN (DRAIN) IMPLANT
EVACUATOR SILICONE 100CC (DRAIN) IMPLANT
FORCEPS BIPOLAR SPETZLER 8 1.0 (NEUROSURGERY SUPPLIES) ×2 IMPLANT
GAUZE SPONGE 4X4 16PLY XRAY LF (GAUZE/BANDAGES/DRESSINGS) IMPLANT
GLOVE BIO SURGEON STRL SZ 6.5 (GLOVE) ×1 IMPLANT
GLOVE BIO SURGEON STRL SZ7 (GLOVE) ×2 IMPLANT
GLOVE BIO SURGEON STRL SZ7.5 (GLOVE) ×4 IMPLANT
GLOVE BIO SURGEON STRL SZ8.5 (GLOVE) ×3 IMPLANT
GLOVE BIO SURGEONS STRL SZ 6.5 (GLOVE) ×1
GLOVE EXAM NITRILE LRG STRL (GLOVE) IMPLANT
GLOVE EXAM NITRILE MD LF STRL (GLOVE) IMPLANT
GLOVE EXAM NITRILE XL STR (GLOVE) IMPLANT
GLOVE EXAM NITRILE XS STR PU (GLOVE) IMPLANT
GLOVE SS BIOGEL STRL SZ 8 (GLOVE) ×1 IMPLANT
GLOVE SUPERSENSE BIOGEL SZ 8 (GLOVE) ×2
GOWN BRE IMP SLV AUR LG STRL (GOWN DISPOSABLE) IMPLANT
GOWN BRE IMP SLV AUR XL STRL (GOWN DISPOSABLE) IMPLANT
GOWN L4 LG 24 PK N/S (GOWN DISPOSABLE) ×2 IMPLANT
GOWN STRL REUS W/TWL LRG LVL3 (GOWN DISPOSABLE) ×2 IMPLANT
KIT BASIN OR (CUSTOM PROCEDURE TRAY) ×3 IMPLANT
KIT ROOM TURNOVER OR (KITS) ×3 IMPLANT
NEEDLE HYPO 22GX1.5 SAFETY (NEEDLE) ×3 IMPLANT
NS IRRIG 1000ML POUR BTL (IV SOLUTION) ×3 IMPLANT
PACK CRANIOTOMY (CUSTOM PROCEDURE TRAY) ×3 IMPLANT
PAD ARMBOARD 7.5X6 YLW CONV (MISCELLANEOUS) ×9 IMPLANT
PATTIES SURGICAL .5 X1 (DISPOSABLE) ×2 IMPLANT
PATTIES SURGICAL 1/4 X 3 (GAUZE/BANDAGES/DRESSINGS) IMPLANT
PIN MAYFIELD SKULL DISP (PIN) ×3 IMPLANT
RUBBERBAND STERILE (MISCELLANEOUS) IMPLANT
SPONGE GAUZE 4X4 12PLY (GAUZE/BANDAGES/DRESSINGS) ×2 IMPLANT
SPONGE LAP 4X18 X RAY DECT (DISPOSABLE) IMPLANT
STAPLER SKIN PROX WIDE 3.9 (STAPLE) ×2 IMPLANT
SUT ETHILON 2 0 FS 18 (SUTURE) ×2 IMPLANT
SUT ETHILON 3 0 FSL (SUTURE) ×2 IMPLANT
SUT NURALON 4 0 TR CR/8 (SUTURE) ×6 IMPLANT
SUT PROLENE 6 0 BV (SUTURE) IMPLANT
SUT VIC AB 1 CT1 18XBRD ANBCTR (SUTURE) ×1 IMPLANT
SUT VIC AB 1 CT1 8-18 (SUTURE) ×6
SUT VIC AB 2-0 CP2 18 (SUTURE) ×5 IMPLANT
SUT VIC AB 3-0 SH 8-18 (SUTURE) ×3 IMPLANT
SYR 20ML ECCENTRIC (SYRINGE) ×3 IMPLANT
SYR CONTROL 10ML LL (SYRINGE) ×3 IMPLANT
TAPE CLOTH SURG 4X10 WHT LF (GAUZE/BANDAGES/DRESSINGS) ×2 IMPLANT
TOWEL OR 17X24 6PK STRL BLUE (TOWEL DISPOSABLE) IMPLANT
TOWEL OR 17X26 10 PK STRL BLUE (TOWEL DISPOSABLE) ×3 IMPLANT
TRAY FOLEY CATH 14FRSI W/METER (CATHETERS) ×2 IMPLANT
UNDERPAD 30X30 INCONTINENT (UNDERPADS AND DIAPERS) IMPLANT
WATER STERILE IRR 1000ML POUR (IV SOLUTION) ×3 IMPLANT

## 2013-05-13 NOTE — Anesthesia Preprocedure Evaluation (Addendum)
Anesthesia Evaluation  Patient identified by MRN, date of birth, ID band Patient awake    Reviewed: Allergy & Precautions, H&P , NPO status , Patient's Chart, lab work & pertinent test results  Airway Mallampati: I TM Distance: >3 FB Neck ROM: Full    Dental  (+) Poor Dentition, Missing, Loose, Dental Advisory Given   Pulmonary  breath sounds clear to auscultation        Cardiovascular Rhythm:Regular Rate:Normal     Neuro/Psych    GI/Hepatic   Endo/Other    Renal/GU      Musculoskeletal   Abdominal   Peds  Hematology   Anesthesia Other Findings   Reproductive/Obstetrics                          Anesthesia Physical Anesthesia Plan  ASA: III and emergent  Anesthesia Plan: General   Post-op Pain Management:    Induction: Intravenous  Airway Management Planned: Oral ETT  Additional Equipment:   Intra-op Plan:   Post-operative Plan: Extubation in OR  Informed Consent: I have reviewed the patients History and Physical, chart, labs and discussed the procedure including the risks, benefits and alternatives for the proposed anesthesia with the patient or authorized representative who has indicated his/her understanding and acceptance.   Dental advisory given  Plan Discussed with: CRNA, Anesthesiologist and Surgeon  Anesthesia Plan Comments:         Anesthesia Quick Evaluation

## 2013-05-13 NOTE — Progress Notes (Addendum)
Subjective:    Patient ID: [name] [name], male    DOB: 11/26/1926, 80 y.o.   MRN: [id number] This chart was scribed for [name] SacramentoJeff Greene, MD by Valera CastleSteven Perry, ED Scribe. This patient was seen in room 07 and the patient's care was started at 3:23 PM.  Authored by [name],MD  But [facility] to [facility]   Chief Complaint  Patient presents with   Dizziness    x 1 day   Anorexia   HPI [name] [name] is a 80 y.o. male Pt has h/o afib, PAD, remote cerebral aneurism, and DVT. Cardiologist Dr. SwazilandJordan. Takes Coumadin and Amiodarone. Last office visit with cardiologist 12/03/2012. EKG at that time sinus rhythm with PACs.   Pt brought in with his daughter, [facility], stating he has trouble balancing, onset 1 day ago. He reports his body leans to the right and has a hard time pushing himself back up with his right arm. He reports some weakness in his arms at baseline, but states this weakness in his right arm is worse than usual. He states the weakness in his right arm has persisted through until today, gradually improving this afternoon.   Relative reports pt had acute onset of incoherency last night with slurring. She states [facility] and his incoherency has improved significantly. Pt reports being fine 3 days ago. He reports fatigue, trouble waking up 2 days ago. He denies arm weakness 2 days ago. He reports constipation and diarrhea yesterday, and reports 2 episodes of constipation followed by diarrhea. He has had associated nausea. He denies h/o similar symptoms. He denies vomiting, headaches, blurry vision, and any other associated symptoms.   With his h/o aneurism, pt denies bleeding from his brain. He states his daughter lives with him. Pt denies any new medications. He denies ETOH use. He denies herbal teas, new supplements.   PCP - Lolita PatellaEADE,ROBERT ALEXANDER, MD  Patient Active Problem List   Diagnosis Date Noted   Peripheral arterial  disease    DVT (deep venous thrombosis)    Atrial fibrillation 06/19/2010   Encounter for long-term (current) use of anticoagulants 06/19/2010   Past Medical History  Diagnosis Date   A-fib    Peripheral arterial disease    DVT (deep venous thrombosis)     history of   Cerebral aneurysm     history of cerebral hemorrhage due to brain aneurysm rupture 25 yrs ago   DJD (degenerative joint disease)    Urethral stricture     history of   Past Surgical History  Procedure Laterality Date   Varicose vein surgery     Appendectomy     Prostatectomy     Tonsillectomy and adenoidectomy     Above knee leg amputation      left   No Known Allergies Prior to Admission medications   Medication Sig Start Date End Date Taking? Authorizing Provider  amiodarone (PACERONE) 200 MG tablet Take 0.5 tablets (100 mg total) by mouth daily. 06/05/12  Yes Peter M SwazilandJordan, MD  Multiple Vitamin (MULTIVITAMIN PO) Take by mouth as needed.     Yes Historical Provider, MD  warfarin (COUMADIN) 2 MG tablet Take as directed by coumadin clinic 04/03/13  Yes Peter M SwazilandJordan, MD   Review of Systems  Constitutional: Negative for fever.  Gastrointestinal: Positive for nausea, diarrhea and constipation. Negative for vomiting.  Musculoskeletal: Positive for gait problem (wheelchair bound, h/o left BKA).  Neurological: Positive  for dizziness and weakness (right arm). Negative for headaches.  Hematological: Bruises/bleeds easily (Coumadin).      Objective:   Physical Exam  Vitals reviewed. Constitutional: He is oriented to person, place, and time. He appears well-developed and well-nourished.  HENT:  Head: Normocephalic and atraumatic.  Slightly dry oral mucosa but palette elevates equally.   Eyes: Conjunctivae and EOM are normal. Pupils are equal, round, and reactive to light. Right eye exhibits no nystagmus. Left eye exhibits no nystagmus.  Neck: No JVD present. Carotid bruit is not present.    Cardiovascular: Normal rate, regular rhythm and normal heart sounds.   No murmur heard. Pulmonary/Chest: Effort normal. He has no wheezes. He has rales.  A few scattered rales inferior bilaterally.  Musculoskeletal: He exhibits no edema.  Neurological: He is alert and oriented to person, place, and time. No cranial nerve deficit or sensory deficit.  Equal grip strength bilaterally. Full strength in right LE. Left BKA. Alert and oriented to person, place, and time with the exception of date. Unable to stand. Seated exam. No apparent pronator drift.   Skin: Skin is warm and dry.  Psychiatric: He has a normal mood and affect.   BP 120/60   Pulse 76   Temp(Src) 97.6 F (36.4 C) (Oral)   Resp 16   SpO2 96%  EKG: SR, low voltage, no apparent form prior EKG.   Results for orders placed in visit on 05/13/13  POCT CBC      Result Value Ref Range   WBC 10.1  4.6 - 10.2 K/uL   Lymph, poc 1.9  0.6 - 3.4   POC LYMPH PERCENT 18.6  10 - 50 %L   MID (cbc) 0.9  0 - 0.9   POC MID % 9.3  0 - 12 %M   POC Granulocyte 7.3 (*) 2 - 6.9   Granulocyte percent 72.1  37 - 80 %G   RBC 5.25  4.69 - 6.13 M/uL   Hemoglobin 14.6  14.1 - 18.1 g/dL   HCT, POC 16.1  09.6 - 53.7 %   MCV 88.8  80 - 97 fL   MCH, POC 27.8  27 - 31.2 pg   MCHC 31.3 (*) 31.8 - 35.4 g/dL   RDW, POC 04.5     Platelet Count, POC 191  142 - 424 K/uL   MPV 10.2  0 - 99.8 fL  GLUCOSE, POCT (MANUAL RESULT ENTRY)      Result Value Ref Range   POC Glucose 107 (*) 70 - 99 mg/dl       Assessment & Plan:   Resean [name] [name] is a 80 y.o. male Dizziness - Plan: POCT CBC, POCT glucose (manual entry), EKG 12-Lead  Right arm weakness - Plan: POCT CBC, POCT glucose (manual entry), EKG 12-Lead  Other malaise and fatigue - Plan: POCT CBC, POCT glucose (manual entry), EKG 12-Lead  Transient disorientation - Plan: POCT CBC, POCT glucose (manual entry), EKG 12-Lead  A-fib - Plan: POCT CBC, POCT glucose (manual entry), EKG 12-Lead  PAD (peripheral  artery disease) - Plan: POCT CBC, POCT glucose (manual entry), EKG 12-Lead  Hx of above, with initial fatigue/malaise 2 days ago, then R arm/Rsided weakness yesterday that resolved today and persistent dizziness/off balance past 2 days. Also noted to be less coherent by daughter this morning, but appears to be more at baseline except for persistent dizziness and generalized weakness. Possible TIA vs minor CVA, vs posterior circulation CVA vs delirium/metabolic cause.  Does have hx cerebral  aneurysm by chart, and takes coumadin - aneurysm also in DDX. IV attempted - 22ga placed in R hand with NS kvo, monitor, EKG as above.  Transport to ER for further eval by EMS.   4:06 PM EMS called for transport at approx 1605  4:23 PM Report to EMS, charge nurse advised.

## 2013-05-13 NOTE — Patient Instructions (Signed)
I advised the charge nurse in the ER about your symptoms.  Further evaluation will be done there.

## 2013-05-13 NOTE — Progress Notes (Signed)
Subjective:  The patient is a bit, but arousable. He is in no apparent distress.  Objective: Vital signs in last 24 hours: Temp:  [97.6 F (36.4 C)] 97.6 F (36.4 C) (04/01 1423) Pulse Rate:  [62-76] 67 (04/01 2015) Resp:  [10-19] 13 (04/01 2015) BP: (106-159)/(58-88) 159/61 mmHg (04/01 2015) SpO2:  [96 %-99 %] 98 % (04/01 2015) Weight:  [90.719 kg (200 lb)] 90.719 kg (200 lb) (04/01 1931)  Intake/Output from previous day:   Intake/Output this shift: Total I/O In: 2500 [I.V.:2500] Out: 350 [Urine:300; Blood:50]  Physical exam the patient is somnolent but arousable. He is moving all 4 extremities well.  Lab Results:  Recent Labs  05/13/13 1602 05/13/13 1734  WBC 10.1 11.2*  HGB 14.6 14.0  HCT 46.6 42.2  PLT  --  190   BMET  Recent Labs  05/13/13 1734  NA 140  K 3.9  CL 102  CO2 25  GLUCOSE 103*  BUN 22  CREATININE 0.89  CALCIUM 8.8    Studies/Results: Ct Head Wo Contrast  05/13/2013   CLINICAL DATA:  Vertigo  EXAM: CT HEAD WITHOUT CONTRAST  TECHNIQUE: Contiguous axial images were obtained from the base of the skull through the vertex without intravenous contrast.  COMPARISON:  None.  FINDINGS: A 4 x 3.3 cm acute intraparenchymal hemorrhage is appreciated within the posterior medial portion of the left cerebellar hemisphere. There is partial effacement of the fourth ventricle. Also effacement of the posterior pontine cisterns . There are regions of vasogenic edema surrounding the hemorrhage. There is no evidence of subfalcine herniation.  Global atrophy. Areas of low attenuation project within the subcortical, deep, and periventricular white matter regions. Focal punctate area of low attenuation is identified within external capsule, consistent with chronic lacunar infarction.  There is no evidence of a depressed skull fracture. The visualized paranasal sinuses and mastoid air cells are patent.  IMPRESSION: Acute intraparenchymal hemorrhage within the left cerebellar  hemisphere. There are findings concerning for impending tonsillar herniation. Hemorrhage within a mass cannot be excluded.  Critical Value/emergent results were called by telephone at the time of interpretation on 05/13/2013 at 6:22 PM to Dr. Chaney MallingAVID YAO , who verbally acknowledged these results.  Involutional and chronic changes.   Electronically Signed   By: Salome HolmesHector  Cooper M.D.   On: 05/13/2013 18:25    Assessment/Plan: The patient is doing well.  LOS: 0 days     Kayly Kriegel D 05/13/2013, 11:46 PM

## 2013-05-13 NOTE — Consult Note (Signed)
Reason for Consult: Left cerebellar hemorrhage Referring Physician: Dr Ramond Marrow Begay is an 80 y.o. male.  HPI: The patient is an 80 year old white male who had the sudden onset of dizziness and nausea. Every time he sits up he gets nauseated and the room spins. The patient came to [facility] [place] apartment and was worked up with a head CT. This demonstrated a left cerebellar hemorrhage with mass effect on the fourth ventricle and brainstem. A neurosurgical consultation was requested. The patient is anticoagulated with Coumadin for atrial for ablation. The rapid reversal protocol has been initiated.  Past Medical History  Diagnosis Date  . A-fib   . Peripheral arterial disease   . DVT (deep venous thrombosis)     history of  . Cerebral aneurysm     history of cerebral hemorrhage due to brain aneurysm rupture 25 yrs ago  . DJD (degenerative joint disease)   . Urethral stricture     history of    Past Surgical History  Procedure Laterality Date  . Varicose vein surgery    . Appendectomy    . Prostatectomy    . Tonsillectomy and adenoidectomy    . Above knee leg amputation      left    No family history on file.  Social History:  reports that he has never smoked. He does not have any smokeless tobacco history on file. He reports that he does not drink alcohol or use illicit drugs.  Allergies: No Known Allergies  Medications:  I have reviewed the patient's current medications. Prior to Admission:  (Not in a hospital admission) Scheduled: . prothrombin complex conc human (Kcentra) IVPB  2,152 Units Intravenous STAT   Continuous:  PRN: Anti-infectives   None       Results for orders placed during the hospital encounter of 05/13/13 (from the past 48 hour(s))  PROTIME-INR     Status: Abnormal   Collection Time    05/13/13  5:34 PM      Result Value Ref Range   Prothrombin Time 25.6 (*) 11.6 - 15.2 seconds   INR 2.43 (*) 0.00 - 1.49  CBC WITH DIFFERENTIAL      Status: Abnormal   Collection Time    05/13/13  5:34 PM      Result Value Ref Range   WBC 11.2 (*) 4.0 - 10.5 K/uL   RBC 4.92  4.22 - 5.81 MIL/uL   Hemoglobin 14.0  13.0 - 17.0 g/dL   HCT 42.2  39.0 - 52.0 %   MCV 85.8  78.0 - 100.0 fL   MCH 28.5  26.0 - 34.0 pg   MCHC 33.2  30.0 - 36.0 g/dL   RDW 14.2  11.5 - 15.5 %   Platelets 190  150 - 400 K/uL   Neutrophils Relative % 74  43 - 77 %   Neutro Abs 8.3 (*) 1.7 - 7.7 K/uL   Lymphocytes Relative 15  12 - 46 %   Lymphs Abs 1.6  0.7 - 4.0 K/uL   Monocytes Relative 11  3 - 12 %   Monocytes Absolute 1.2 (*) 0.1 - 1.0 K/uL   Eosinophils Relative 0  0 - 5 %   Eosinophils Absolute 0.0  0.0 - 0.7 K/uL   Basophils Relative 0  0 - 1 %   Basophils Absolute 0.0  0.0 - 0.1 K/uL  BASIC METABOLIC PANEL     Status: Abnormal   Collection Time    05/13/13  5:34 PM  Result Value Ref Range   Sodium 140  137 - 147 mEq/L   Potassium 3.9  3.7 - 5.3 mEq/L   Chloride 102  96 - 112 mEq/L   CO2 25  19 - 32 mEq/L   Glucose, Bld 103 (*) 70 - 99 mg/dL   BUN 22  6 - 23 mg/dL   Creatinine, Ser 0.89  0.50 - 1.35 mg/dL   Calcium 8.8  8.4 - 10.5 mg/dL   GFR calc non Af Amer 76 (*) >90 mL/min   GFR calc Af Amer 88 (*) >90 mL/min   Comment: (NOTE)     The eGFR has been calculated using the CKD EPI equation.     This calculation has not been validated in all clinical situations.     eGFR's persistently <90 mL/min signify possible Chronic Kidney     Disease.  ABO/RH     Status: None   Collection Time    05/13/13  5:34 PM      Result Value Ref Range   ABO/RH(D) O POS    URINALYSIS, ROUTINE W REFLEX MICROSCOPIC     Status: Abnormal   Collection Time    05/13/13  6:09 PM      Result Value Ref Range   Color, Urine YELLOW  YELLOW   APPearance CLOUDY (*) CLEAR   Specific Gravity, Urine 1.016  1.005 - 1.030   pH 6.0  5.0 - 8.0   Glucose, UA NEGATIVE  NEGATIVE mg/dL   Hgb urine dipstick MODERATE (*) NEGATIVE   Bilirubin Urine NEGATIVE  NEGATIVE    Ketones, ur NEGATIVE  NEGATIVE mg/dL   Protein, ur 30 (*) NEGATIVE mg/dL   Urobilinogen, UA 0.2  0.0 - 1.0 mg/dL   Nitrite POSITIVE (*) NEGATIVE   Leukocytes, UA MODERATE (*) NEGATIVE  URINE MICROSCOPIC-ADD ON     Status: None   Collection Time    05/13/13  6:09 PM      Result Value Ref Range   Squamous Epithelial / LPF RARE  RARE   WBC, UA 21-50  <3 WBC/hpf   RBC / HPF 0-2  <3 RBC/hpf   Bacteria, UA RARE  RARE  PREPARE FRESH FROZEN PLASMA     Status: None   Collection Time    05/13/13  7:00 PM      Result Value Ref Range   Unit Number [id number]     Blood Component Type THAWED PLASMA     Unit division 00     Status of Unit ALLOCATED     Transfusion Status OK TO TRANSFUSE     Unit Number [id number]     Blood Component Type THAWED PLASMA     Unit division 00     Status of Unit ALLOCATED     Transfusion Status OK TO TRANSFUSE      Ct Head Wo Contrast  05/13/2013   CLINICAL DATA:  Vertigo  EXAM: CT HEAD WITHOUT CONTRAST  TECHNIQUE: Contiguous axial images were obtained from the base of the skull through the vertex without intravenous contrast.  COMPARISON:  None.  FINDINGS: A 4 x 3.3 cm acute intraparenchymal hemorrhage is appreciated within the posterior medial portion of the left cerebellar hemisphere. There is partial effacement of the fourth ventricle. Also effacement of the posterior pontine cisterns . There are regions of vasogenic edema surrounding the hemorrhage. There is no evidence of subfalcine herniation.  Global atrophy. Areas of low attenuation project within the subcortical, deep, and periventricular white matter regions. Focal punctate  area of low attenuation is identified within external capsule, consistent with chronic lacunar infarction.  There is no evidence of a depressed skull fracture. The visualized paranasal sinuses and mastoid air cells are patent.  IMPRESSION: Acute intraparenchymal hemorrhage within the left cerebellar hemisphere. There are findings  concerning for impending tonsillar herniation. Hemorrhage within a mass cannot be excluded.  Critical Value/emergent results were called by telephone at the time of interpretation on 05/13/2013 at 6:22 PM to Dr. Shirlyn Goltz , who verbally acknowledged these results.  Involutional and chronic changes.   Electronically Signed   By: Margaree Mackintosh M.D.   On: 05/13/2013 18:25    ROS as above Blood pressure 131/58, pulse 67, resp. rate 16, weight 90.719 kg (200 lb), SpO2 99.00%. Physical Exam  General: An alert and pleasant 80 year old white male complains of dizziness.  HEENT: Normocephalic, atraumatic, pupils equal round reactive light, extraocular muscles intact   Neck: Supple without masses or deformities. Spurling's testing is negative. He has age-appropriate normal range of motion.  Thorax: Symmetric  Abdomen: Soft, protuberant  Heart, irregular rhythm  Extremities: The patient has a left lower extremity indication  Neurologic exam: The patient is alert and oriented x3. Glasgow Coma Scale 15. Cranial nerves II through XII were examined bilaterally and grossly normal. Strength is normal in his bilateral eyes at, triceps, hand grips, psoas, right gastrocnemius and dorsiflexors. Sensory function is intact to light touch sensation all tested dermatomes bilaterally. Cerebellar function is intact to right finger to nose. He has some dysmetria 2 left finger to nose.  I reviewed the patient's head CT performed without contrast at [facility] today. It demonstrates a large left cerebellar intracerebral hemorrhage with mass effect on the fourth ventricle.  Assessment/Plan: Left cerebellar hemorrhage, atrial fibrillation, Coumadin anti-coagulation: I discussed situation with the patient and his daughter. We have discussed the various treatment options including doing nothing, observation, and surgery. I have explained to them that while he is doing well now, I think his "standing on an edge of a  cliff" and we'll likely have a neurologic decline with his hemorrhage. I have therefore recommended that he undergo the rapid reversal anticoagulation and undergo a left suboccipital craniectomy for resection of the hematoma. I described the surgery to them. We have discussed the risks of surgery including the risk of anesthesia, hemorrhage, stroke, infection, spinal fluid leak, death, medical risk etc. I have answered all their questions. The patient has decided to proceed with surgery. We will plan to do it after he gets his rapid reversal medications.  Ihor Meinzer D 05/13/2013, 7:51 PM

## 2013-05-13 NOTE — Op Note (Signed)
Brief history: The patient is an 80 year old white male who is on Coumadin for atrial fibrillation. He developed severe dizziness and nausea. He was brought to the emergency garment where a CAT scan was obtained which demonstrated a large left cerebellar hemorrhage. I discussed the various treatment options with the patient including surgery. He has weighed the risks, benefits, and alternatives surgery and decided proceed with a left suboccipital craniectomy for evacuation of a cerebellar hemorrhage.  Preop diagnosis: Left cerebellar hemorrhage  Postop diagnosis: The same  Procedure: left suboccipital craniectomy for evacuation of left cerebellar hematoma  Surgeon: Dr. Delma OfficerJeff Mohid Furuya  Asst.: None  Anesthesia: Gen. endotracheal  Specimens: None  Drains: None  Complications: None  Description of procedure: The patient was brought to the operating room by the anesthesia team. General endotracheal anesthesia was induced. The [place] 3 point headrest was applied tocalvarium. He was then turned to the prone position on the chest rolls. The patient's suboccipital region was then shaved with clippers and this region as well as his neck and upper thorax was then prepared with Betadine scrub and Betadine solution. Sterile drapes were applied. I then injected the area to be incised with Marcaine with epinephrine solution. I uses scalpel to make a left paramedian incision at the patient's cranial cervical junction. I used the cerebellar retractor for exposure. I used electrical cautery to incise to the cervical fascia and  to dissect through the cervical musculature and to expose the underlying suboccipital bone. I then used a high-speed drill to perform a left suboccipital craniectomy. I widen the craniectomy with a Kerrison punches. I then used a 15 blade scalpel to incise the dura. I continued the incision using the Metzenbaum scissors cutting the dura in a cruciate fashion. We tacked up the dural edges  with 4-0 Nurolon suture. I then used bipolar electrocautery suction and irrigation to create a small cerebellar corticotomy. I immediately entered into the hematoma cavity. I evacuated the hematoma using suction and irrigation. After I evacuated the hematoma I continued to remove some obviously nonviable cerebellum. I then obtained hemostasis using bipolar electrocautery. I irrigated the wound out with saline solution until it came back crystal clear. I applied Avitene to the resection cavity. I let the Avitene sit in the wound for several minutes and then irrigated it out. The irrigant came back grossly clear. I then lined the resection cavity with Surgicel. I then reapproximated the patient's dura with a pursestring 4-0 Nurolon suture. I l laid a large piece of Gelfoam over the craniectomy site. I then removed the retractors and reapproximated patient's cervical fascia was reapproximated with interrupted 0 Vicryl suture. I then reapproximated the subcutaneous tissue with interrupted 2-0 Vicryl suture. I then reapproximated the skin with a running 3-0 nylon suture. The wound was then coated with bacitracin ointment. A sterile dressing was applied. The drapes were removed. The patient was subsequently returned to the supine position. I then removed the 3 point headrest from the patient's calvarium. The patient was subsequently extubated by the anesthesia team and transported to the post anesthesia care unit in stable condition. By report all sponge instrument and needle counts were correct at the end this case.

## 2013-05-13 NOTE — Transfer of Care (Signed)
Immediate Anesthesia Transfer of Care Note  Patient: [name] [name]  Procedure(s) Performed: Procedure(s): Left SUBOCCIPITAL CRANIECTOMY (Left)  Patient Location: ICU  Anesthesia Type:General  Level of Consciousness: awake  Airway & Oxygen Therapy: Patient Spontanous Breathing and Patient connected to nasal cannula oxygen  Post-op Assessment: Report given to PACU RN, Post -op Vital signs reviewed and stable and Patient moving all extremities  Post vital signs: Reviewed and stable  Complications: No apparent anesthesia complications

## 2013-05-13 NOTE — Anesthesia Postprocedure Evaluation (Signed)
  Anesthesia Post-op Note  Patient: [name] [name]  Procedure(s) Performed: Procedure(s): Left SUBOCCIPITAL CRANIECTOMY (Left)  Patient Location: ICU  Anesthesia Type:General  Level of Consciousness: awake and alert   Airway and Oxygen Therapy: Patient connected to nasal cannula oxygen  Post-op Pain: mild  Post-op Assessment: Post-op Vital signs reviewed  Post-op Vital Signs: Reviewed  Complications: No apparent anesthesia complications

## 2013-05-13 NOTE — ED Provider Notes (Signed)
[name] KaufmannOh CSN: [id number]     Arrival date & time 05/13/13  1651 History   First MD Initiated Contact with Patient 05/13/13 1714     Chief Complaint  Patient presents with  . Weakness    HPI 80 year old male with a history of atrial fibrillation on Coumadin presents complaining of dizziness. Onset was about 3 days ago. For the past 2-3 days he's felt extremely fatigued and tired. He states that he has not wanted to get out of bed in the morning and has mostly slept all day. Yesterday and today when he tried to get out of bed he became very dizzy. He describes the dizziness as a sensation that the bed is moving and he has trouble keeping his balance. When he lays back down he feels better within a few seconds. He has not had any headache, neck pain, neck stiffness, visual changes, weakness, paresthesias, numbness, difficulty with speech. He's not tried any treatments. He states that his symptoms are moderate in severity. He's had some nausea. No vomiting. He has never had vertigo before. He has not had any syncope. No chest pain, palpitations, shortness of breath.   Past Medical History  Diagnosis Date  . A-fib   . Peripheral arterial disease   . DVT (deep venous thrombosis)     history of  . Cerebral aneurysm     history of cerebral hemorrhage due to brain aneurysm rupture 25 yrs ago  . DJD (degenerative joint disease)   . Urethral stricture     history of   Past Surgical History  Procedure Laterality Date  . Varicose vein surgery    . Appendectomy    . Prostatectomy    . Tonsillectomy and adenoidectomy    . Above knee leg amputation      left   No family history on file. History  Substance Use Topics  . Smoking status: Never Smoker   . Smokeless tobacco: Not on file  . Alcohol Use: No    Review of Systems  Constitutional: Positive for fatigue. Negative for fever and chills.  HENT: Negative for congestion and rhinorrhea.   Eyes: Negative for visual disturbance.  Respiratory:  Negative for cough and shortness of breath.   Cardiovascular: Negative for chest pain and leg swelling.  Gastrointestinal: Negative for nausea, vomiting, abdominal pain and diarrhea.  Genitourinary: Negative for dysuria, urgency, frequency, flank pain and difficulty urinating.  Musculoskeletal: Negative for back pain, neck pain and neck stiffness.  Skin: Negative for rash.  Neurological: Positive for dizziness and weakness (generalized). Negative for syncope, numbness and headaches.  All other systems reviewed and are negative.      Allergies  Review of patient's allergies indicates no known allergies.  Home Medications   Current Outpatient Rx  Name  Route  Sig  Dispense  Refill  . amiodarone (PACERONE) 200 MG tablet   Oral   Take 100 mg by mouth daily.         . Multiple Vitamin (MULTIVITAMIN PO)   Oral   Take 1 tablet by mouth daily.          Marland Kitchen. warfarin (COUMADIN) 2 MG tablet   Oral   Take 1-2 mg by mouth daily. Takes 2mg  on wednesdays and 1mg  all other days          BP 131/58  Pulse 67  Resp 16  Wt 200 lb (90.719 kg)  SpO2 99% Physical Exam  Nursing note and vitals reviewed. Constitutional: He appears well-developed and well-nourished.  No distress.  HENT:  Head: Normocephalic and atraumatic.  Mouth/Throat: Oropharynx is clear and moist.  Eyes: Conjunctivae and EOM are normal. Pupils are equal, round, and reactive to light.  Neck: Normal range of motion. Neck supple.  Cardiovascular: Normal rate, regular rhythm, normal heart sounds and intact distal pulses.  Exam reveals no gallop and no friction rub.   No murmur heard. Neurological:  Awake, alert, oriented x4, GCS 15. Cranial nerves II through XII intact. He has 5 out of 5 strength in bilateral upper and lower extremities. Of note he has an above-knee amputation of the left lower extremity. Normal sensation to light touch throughout bilateral upper and lower extremities.  He has a mild horizontal nystagmus.   finger to nose he is normal in right upper extremity. Slightly ataxic and left upper extremity. Unable to perform heel-to-shin is he is an amputee    ED Course  Procedures (including critical care time) Labs Review Labs Reviewed  PROTIME-INR - Abnormal; Notable for the following:    Prothrombin Time 25.6 (*)    INR 2.43 (*)    All other components within normal limits  CBC WITH DIFFERENTIAL - Abnormal; Notable for the following:    WBC 11.2 (*)    Neutro Abs 8.3 (*)    Monocytes Absolute 1.2 (*)    All other components within normal limits  BASIC METABOLIC PANEL - Abnormal; Notable for the following:    Glucose, Bld 103 (*)    GFR calc non Af Amer 76 (*)    GFR calc Af Amer 88 (*)    All other components within normal limits  URINALYSIS, ROUTINE W REFLEX MICROSCOPIC - Abnormal; Notable for the following:    APPearance CLOUDY (*)    Hgb urine dipstick MODERATE (*)    Protein, ur 30 (*)    Nitrite POSITIVE (*)    Leukocytes, UA MODERATE (*)    All other components within normal limits  URINE MICROSCOPIC-ADD ON  PREPARE FRESH FROZEN PLASMA  ABO/RH   Imaging Review Ct Head Wo Contrast  05/13/2013   CLINICAL DATA:  Vertigo  EXAM: CT HEAD WITHOUT CONTRAST  TECHNIQUE: Contiguous axial images were obtained from the base of the skull through the vertex without intravenous contrast.  COMPARISON:  None.  FINDINGS: A 4 x 3.3 cm acute intraparenchymal hemorrhage is appreciated within the posterior medial portion of the left cerebellar hemisphere. There is partial effacement of the fourth ventricle. Also effacement of the posterior pontine cisterns . There are regions of vasogenic edema surrounding the hemorrhage. There is no evidence of subfalcine herniation.  Global atrophy. Areas of low attenuation project within the subcortical, deep, and periventricular white matter regions. Focal punctate area of low attenuation is identified within external capsule, consistent with chronic lacunar  infarction.  There is no evidence of a depressed skull fracture. The visualized paranasal sinuses and mastoid air cells are patent.  IMPRESSION: Acute intraparenchymal hemorrhage within the left cerebellar hemisphere. There are findings concerning for impending tonsillar herniation. Hemorrhage within a mass cannot be excluded.  Critical Value/emergent results were called by telephone at the time of interpretation on 05/13/2013 at 6:22 PM to Dr. Chaney Malling , who verbally acknowledged these results.  Involutional and chronic changes.   Electronically Signed   By: Salome Holmes M.D.   On: 05/13/2013 18:25     EKG Interpretation None      MDM   80 year old male with atrial fibrillation on Coumadin presents with weakness, fatigue, somnolence, and  vertigo. He has normal vital signs. GCS is 15. His neurological exam is worrisome for slight ataxia in his left upper extremity. Otherwise his exam is nonfocal.  CT head demonstrates an acute intraparenchymal hemorrhage, 4 x 3 cm, of the left cerebellar hemisphere with impending tonsillar herniation. Neurosurgery was consult and evaluated the patient in the emergency department.  His INR is 2.43. All of his labs are reviewed as detailed above. He was given K. Sentra for rapid reversal of his anticoagulation. He was also given 10 mg of IV vitamin K.  Neurosurgery will take the patient emergently to the operating room as soon as his INR is reversed.  An approximate 8 PM, I reevaluated the patient and his neurological exam was unchanged. He remains alert with a GCS of 15. He is protecting his airway. He does not emergently needed to be intubated, however I do anticipate the distinct possibility of significant neurological decline.  Final diagnoses:  Cerebellar hemorrhage, acute       Toney Sang, MD 05/13/13 2023

## 2013-05-13 NOTE — Anesthesia Procedure Notes (Signed)
Procedure Name: Intubation Date/Time: 05/13/2013 9:22 PM Performed by: Luster LandsbergHASE, Sherryn Pollino R Pre-anesthesia Checklist: Patient identified, Emergency Drugs available, Suction available and Patient being monitored Patient Re-evaluated:Patient Re-evaluated prior to inductionOxygen Delivery Method: Circle system utilized Preoxygenation: Pre-oxygenation with 100% oxygen Intubation Type: IV induction Ventilation: Mask ventilation without difficulty and Oral airway inserted - appropriate to patient size Laryngoscope Size: Mac and 3 Grade View: Grade I Tube type: Oral Tube size: 7.5 mm Number of attempts: 1 Airway Equipment and Method: Stylet Placement Confirmation: ETT inserted through vocal cords under direct vision,  positive ETCO2 and breath sounds checked- equal and bilateral Secured at: 23 cm Tube secured with: Tape Dental Injury: Teeth and Oropharynx as per pre-operative assessment

## 2013-05-13 NOTE — ED Notes (Signed)
[facility] Monday and has been progressive since. No facial, slurred speech. Family member stated had confusion this AM. VSS, A/O. Patient denies any headache but he does have complaints of the room spinning.

## 2013-05-14 ENCOUNTER — Inpatient Hospital Stay (HOSPITAL_COMMUNITY): Payer: Medicare Other

## 2013-05-14 ENCOUNTER — Encounter (HOSPITAL_COMMUNITY): Payer: Self-pay | Admitting: Neurosurgery

## 2013-05-14 DIAGNOSIS — I614 Nontraumatic intracerebral hemorrhage in cerebellum: Secondary | ICD-10-CM | POA: Diagnosis present

## 2013-05-14 LAB — PREPARE FRESH FROZEN PLASMA
UNIT DIVISION: 0
UNIT DIVISION: 0

## 2013-05-14 LAB — PROTIME-INR
INR: 1.17 (ref 0.00–1.49)
INR: 1.15 (ref 0.00–1.49)
INR: 1.21 (ref 0.00–1.49)
PROTHROMBIN TIME: 15 s (ref 11.6–15.2)
Prothrombin Time: 14.7 seconds (ref 11.6–15.2)
Prothrombin Time: 14.5 seconds (ref 11.6–15.2)

## 2013-05-14 LAB — MRSA PCR SCREENING: MRSA by PCR: NEGATIVE

## 2013-05-14 MED ORDER — PANTOPRAZOLE SODIUM 40 MG IV SOLR
40.0000 mg | Freq: Every day | INTRAVENOUS | Status: DC
  Administered 2013-05-14 (×2): 40 mg via INTRAVENOUS
  Filled 2013-05-14 (×4): qty 40

## 2013-05-14 MED ORDER — AMIODARONE HCL 100 MG PO TABS
100.0000 mg | ORAL_TABLET | Freq: Every day | ORAL | Status: DC
  Administered 2013-05-14 – 2013-05-23 (×10): 100 mg via ORAL
  Filled 2013-05-14 (×10): qty 1

## 2013-05-14 MED ORDER — DOCUSATE SODIUM 100 MG PO CAPS
100.0000 mg | ORAL_CAPSULE | Freq: Two times a day (BID) | ORAL | Status: DC
  Administered 2013-05-14 – 2013-05-23 (×17): 100 mg via ORAL
  Filled 2013-05-14 (×19): qty 1

## 2013-05-14 MED ORDER — ONDANSETRON HCL 4 MG PO TABS: 4.0000 mg | ORAL_TABLET | ORAL | Status: DC | PRN

## 2013-05-14 MED ORDER — HYDROCODONE-ACETAMINOPHEN 5-325 MG PO TABS
1.0000 | ORAL_TABLET | ORAL | Status: DC | PRN
  Administered 2013-05-15 – 2013-05-23 (×24): 1 via ORAL
  Filled 2013-05-14 (×24): qty 1

## 2013-05-14 MED ORDER — BISACODYL 10 MG RE SUPP
10.0000 mg | Freq: Every day | RECTAL | Status: DC | PRN
  Filled 2013-05-14: qty 1

## 2013-05-14 MED ORDER — MORPHINE SULFATE 2 MG/ML IJ SOLN
1.0000 mg | INTRAMUSCULAR | Status: DC | PRN
  Administered 2013-05-14: 1 mg via INTRAVENOUS
  Administered 2013-05-14: 2 mg via INTRAVENOUS
  Administered 2013-05-15: 1 mg via INTRAVENOUS
  Administered 2013-05-16 – 2013-05-18 (×8): 2 mg via INTRAVENOUS
  Filled 2013-05-14 (×11): qty 1

## 2013-05-14 MED ORDER — ACETAMINOPHEN 650 MG RE SUPP: 650.0000 mg | RECTAL | Status: DC | PRN

## 2013-05-14 MED ORDER — ONDANSETRON HCL 4 MG/2ML IJ SOLN: 4.0000 mg | INTRAMUSCULAR | Status: DC | PRN

## 2013-05-14 MED ORDER — POTASSIUM CHLORIDE IN NACL 20-0.9 MEQ/L-% IV SOLN
INTRAVENOUS | Status: DC
  Administered 2013-05-14 – 2013-05-15 (×3): via INTRAVENOUS
  Administered 2013-05-16: 75 mL via INTRAVENOUS
  Administered 2013-05-17: 75 mL/h via INTRAVENOUS
  Administered 2013-05-17 – 2013-05-18 (×3): via INTRAVENOUS
  Administered 2013-05-21: 1000 mL via INTRAVENOUS
  Administered 2013-05-21 – 2013-05-22 (×2): via INTRAVENOUS
  Filled 2013-05-14 (×22): qty 1000

## 2013-05-14 MED ORDER — ACETAMINOPHEN 325 MG PO TABS
650.0000 mg | ORAL_TABLET | ORAL | Status: DC | PRN
  Administered 2013-05-20: 650 mg via ORAL
  Administered 2013-05-21 – 2013-05-22 (×2): 325 mg via ORAL
  Administered 2013-05-23: 650 mg via ORAL
  Filled 2013-05-14 (×4): qty 2

## 2013-05-14 MED ORDER — FLEET ENEMA 7-19 GM/118ML RE ENEM: 1.0000 | ENEMA | Freq: Once | RECTAL | Status: AC | PRN

## 2013-05-14 MED ORDER — PROMETHAZINE HCL 25 MG PO TABS: 12.5000 mg | ORAL_TABLET | ORAL | Status: DC | PRN

## 2013-05-14 MED ORDER — CEFAZOLIN SODIUM-DEXTROSE 2-3 GM-% IV SOLR
2.0000 g | Freq: Three times a day (TID) | INTRAVENOUS | Status: AC
  Administered 2013-05-14 (×2): 2 g via INTRAVENOUS
  Filled 2013-05-14 (×2): qty 50

## 2013-05-14 MED ORDER — LABETALOL HCL 5 MG/ML IV SOLN
10.0000 mg | INTRAVENOUS | Status: DC | PRN
  Administered 2013-05-18: 20 mg via INTRAVENOUS
  Administered 2013-05-19: 10 mg via INTRAVENOUS
  Filled 2013-05-14 (×2): qty 4

## 2013-05-14 NOTE — ED Provider Notes (Signed)
I saw and evaluated the patient, reviewed the resident's note and I agree with the findings and plan.   EKG Interpretation None      [name] [name] is a 80 y.o. male hx of afib on coumadin, previous cerebral aneCy Blamerurysm with no prior treatment here with dizziness. Sense of room spinning for the last 3 days. Worse with sitting up. On exam, he has horizontal nystagmus. He also has abnormal head impulse. He has abnormal finger to nose on the L. No pronator drift. Strength unremarkable. CT showed cerebellar hemorrhage. Dr. Lovell SheehanJenkins from neurosurgery will take him to OR. INR 2.4, he recommend reversing [facility] and Vit K, which were given in the ED. He doesn't require intubation currently and is protecting his airway.   CRITICAL CARE Performed by: Silverio LayYAO, DAVID   Total critical care time: 30 min   Critical care time was exclusive of separately billable procedures and treating other patients.  Critical care was necessary to treat or prevent imminent or life-threatening deterioration.  Critical care was time spent personally by me on the following activities: development of treatment plan with patient and/or surrogate as well as nursing, discussions with consultants, evaluation of patient's response to treatment, examination of patient, obtaining history from patient or surrogate, ordering and performing treatments and interventions, ordering and review of laboratory studies, ordering and review of radiographic studies, pulse oximetry and re-evaluation of patient's condition.    Richardean Canalavid H Yao, MD 05/14/13 1059

## 2013-05-14 NOTE — Progress Notes (Signed)
OT Note Pt seen for initial eval.Full note to follow. Pt weak from surgery and may benfit from rehab prior to D/C home and pt needs to be mod I with transfers and self care.  [facility] Brayson Livesey, OTR/L  [phone] 05/14/2013

## 2013-05-14 NOTE — Progress Notes (Addendum)
Pt's family member states speech is not as slurred today as it was yesterday. Daughter also stated that for 2 weeks pt has had sometimes had difficulty recalling common names of things.

## 2013-05-14 NOTE — Progress Notes (Signed)
Utilization review completed. Shifa Brisbon, RN, BSN. 

## 2013-05-14 NOTE — Progress Notes (Signed)
INITIAL NUTRITION ASSESSMENT  DOCUMENTATION CODES Per approved criteria  -Obesity Unspecified   INTERVENTION: Supplement diet as needed.   NUTRITION DIAGNOSIS: Increased nutrient needs related to surgery as evidenced by estimated needs.   Goal: Pt to meet >/= 90% of their estimated nutrition needs   Monitor:  Diet advancement, PO intake, weight trends  Reason for Assessment: Pt identified as at nutrition risk on the Malnutrition Screen Tool  80 y.o. male  Admitting Dx: <principal problem not specified>  ASSESSMENT: Pt s/p left suboccipital craniectomy for evacuation of left cerebellar hematoma 4/1. Pt remains NPO. Pt has headache (notified RN). Pt is unsure of his weight hx, states that he does not weight himself much. Pt with left AKA.   Nutrition Focused Physical Exam:  Subcutaneous Fat:  Orbital Region: WNL Upper Arm Region: WNl Thoracic and Lumbar Region: WNL  Muscle:  Temple Region: WNl Clavicle Bone Region: WNL Clavicle and Acromion Bone Region: WNL Scapular Bone Region: WNl Dorsal Hand: WNL Patellar Region: WNL Anterior Thigh Region: WNL Posterior Calf Region: WNL  Edema: not present   Height: Ht Readings from Last 1 Encounters:  05/14/13 5\' 10"  (1.778 m)  Left AKA  Weight: Wt Readings from Last 1 Encounters:  05/14/13 204 lb 2.3 oz (92.6 kg)    Ideal Body Weight: 69.3 kg   % Ideal Body Weight: 134%  Wt Readings from Last 10 Encounters:  05/14/13 204 lb 2.3 oz (92.6 kg)  05/14/13 204 lb 2.3 oz (92.6 kg)  04/03/11 226 lb 12.8 oz (102.876 kg)    Usual Body Weight: unknown  % Usual Body Weight: -  BMI:  Body mass index is 29.29 kg/(m^2).  Estimated Nutritional Needs: Kcal: 1800-2000 Protein: 90-100 grams Fluid: > 1.8 L/day  Skin: head incision  Diet Order: NPO  EDUCATION NEEDS: -No education needs identified at this time   Intake/Output Summary (Last 24 hours) at 05/14/13 0950 Last data filed at 05/14/13 0730  Gross per 24  hour  Intake   2925 ml  Output    765 ml  Net   2160 ml    Last BM: PTA   Labs:   Recent Labs Lab 05/13/13 1734  NA 140  K 3.9  CL 102  CO2 25  BUN 22  CREATININE 0.89  CALCIUM 8.8  GLUCOSE 103*    CBG (last 3)  No results found for this basename: GLUCAP,  in the last 72 hours  Scheduled Meds: . amiodarone  100 mg Oral Daily  .  ceFAZolin (ANCEF) IV  2 g Intravenous Q8H  . docusate sodium  100 mg Oral BID  . pantoprazole (PROTONIX) IV  40 mg Intravenous QHS    Continuous Infusions: . 0.9 % NaCl with KCl 20 mEq / L 75 mL/hr at 05/14/13 0206    Past Medical History  Diagnosis Date  . A-fib   . Peripheral arterial disease   . DVT (deep venous thrombosis)     history of  . Cerebral aneurysm     history of cerebral hemorrhage due to brain aneurysm rupture 25 yrs ago  . DJD (degenerative joint disease)   . Urethral stricture     history of    Past Surgical History  Procedure Laterality Date  . Varicose vein surgery    . Appendectomy    . Prostatectomy    . Tonsillectomy and adenoidectomy    . Above knee leg amputation      left    Kendell BaneHeather Cayce Quezada RD, LDN, CNSC  [phone] Pager [phone] After Hours Pager

## 2013-05-14 NOTE — Plan of Care (Signed)
Problem: Consults Goal: Diagnosis - Craniotomy Intraparenchymal hemorrhage

## 2013-05-14 NOTE — Progress Notes (Signed)
Patient ID: Raji Glinski, male   DOB: 1926-04-09, 80 y.o.   MRN: [id number] Subjective:  The patient is alert and pleasant. He looks great. He has no complaints.  Objective: Vital signs in last 24 hours: Temp:  [97.6 F (36.4 C)-99.1 F (37.3 C)] 98.8 F (37.1 C) (04/02 1330) Pulse Rate:  [50-87] 87 (04/02 1300) Resp:  [10-27] 23 (04/02 1300) BP: (100-159)/(42-88) 139/62 mmHg (04/02 1300) SpO2:  [88 %-100 %] 93 % (04/02 1300) Arterial Line BP: (115-162)/(38-68) 144/61 mmHg (04/02 1300) Weight:  [90.719 kg (200 lb)-92.6 kg (204 lb 2.3 oz)] 92.6 kg (204 lb 2.3 oz) (04/02 0100)  Intake/Output from previous day: 04/01 0701 - 04/02 0700 In: 2925 [I.V.:2875; IV Piggyback:50] Out: 635 [Urine:585; Blood:50] Intake/Output this shift: Total I/O In: 500 [I.V.:450; IV Piggyback:50] Out: 905 [Urine:905]  Physical exam the patient is alert and oriented x3. He is moving all 4 extremities well. His dressing is clean and dry.  Lab Results:  Recent Labs  05/13/13 1602 05/13/13 1734  WBC 10.1 11.2*  HGB 14.6 14.0  HCT 46.6 42.2  PLT  --  190   BMET  Recent Labs  05/13/13 1734  NA 140  K 3.9  CL 102  CO2 25  GLUCOSE 103*  BUN 22  CREATININE 0.89  CALCIUM 8.8    Studies/Results: Ct Head Wo Contrast  05/14/2013   CLINICAL DATA:  Followup hematoma evacuation  EXAM: CT HEAD WITHOUT CONTRAST  TECHNIQUE: Contiguous axial images were obtained from the base of the skull through the vertex without intravenous contrast.  COMPARISON:  05/13/2013  FINDINGS: There has been left occipital craniectomy and evacuation of a 3-4 cm hematoma in the left cerebellar hemisphere. There is no residual hyperdense hematoma. Small amount of air in the operative bed and in the subarachnoid spaces. Less mass effect with essentially normal configuration of the fourth ventricle. No obstructive hydrocephalus. Chronic small vessel changes throughout the cerebral hemispheric white matter appear the same.  IMPRESSION:  Good appearance following evacuation of a left cerebellar hematoma. There is no longer any mass effect upon the fourth ventricle. No residual or recurrent hematoma or other apparent complication.   Electronically Signed   By: Paulina Fusi M.D.   On: 05/14/2013 08:37   Ct Head Wo Contrast  05/13/2013   CLINICAL DATA:  Vertigo  EXAM: CT HEAD WITHOUT CONTRAST  TECHNIQUE: Contiguous axial images were obtained from the base of the skull through the vertex without intravenous contrast.  COMPARISON:  None.  FINDINGS: A 4 x 3.3 cm acute intraparenchymal hemorrhage is appreciated within the posterior medial portion of the left cerebellar hemisphere. There is partial effacement of the fourth ventricle. Also effacement of the posterior pontine cisterns . There are regions of vasogenic edema surrounding the hemorrhage. There is no evidence of subfalcine herniation.  Global atrophy. Areas of low attenuation project within the subcortical, deep, and periventricular white matter regions. Focal punctate area of low attenuation is identified within external capsule, consistent with chronic lacunar infarction.  There is no evidence of a depressed skull fracture. The visualized paranasal sinuses and mastoid air cells are patent.  IMPRESSION: Acute intraparenchymal hemorrhage within the left cerebellar hemisphere. There are findings concerning for impending tonsillar herniation. Hemorrhage within a mass cannot be excluded.  Critical Value/emergent results were called by telephone at the time of interpretation on 05/13/2013 at 6:22 PM to Dr. Chaney Malling , who verbally acknowledged these results.  Involutional and chronic changes.   Electronically Signed  By: Salome HolmesHector  Cooper M.D.   On: 05/13/2013 18:25    Assessment/Plan: Postop day 1: The patient is doing well clinically. His CAT scan looks good. We will likely transfer him out of the ICU tomorrow. He may be able to go home this weekend. He lives with his daughter.  LOS: 1 day      Brecken Walth D 05/14/2013, 2:17 PM

## 2013-05-15 ENCOUNTER — Encounter (HOSPITAL_COMMUNITY): Payer: Self-pay | Admitting: General Practice

## 2013-05-15 LAB — PROTIME-INR
INR: 1.28 (ref 0.00–1.49)
Prothrombin Time: 15.7 seconds — ABNORMAL HIGH (ref 11.6–15.2)

## 2013-05-15 MED ORDER — PANTOPRAZOLE SODIUM 40 MG PO TBEC
40.0000 mg | DELAYED_RELEASE_TABLET | Freq: Every day | ORAL | Status: DC
  Administered 2013-05-15 – 2013-05-23 (×9): 40 mg via ORAL
  Filled 2013-05-15 (×8): qty 1

## 2013-05-15 NOTE — Progress Notes (Signed)
Patient ID: [name] [name], male   DOB: 07/29/1926, 80 y.o.   MRN: [id number] BP 84/24  Pulse 63  Temp(Src) 98.1 F (36.7 C) (Oral)  Resp 21  Ht 5\' 10"  (1.778 m)  Wt 92.6 kg (204 lb 2.3 oz)  BMI 29.29 kg/m2  SpO2 97% Alert, oriented, speech is dysarthric, but is better than yesterday Perrl, full eom Symmetric facies, tongue and uvula midline Moving upper extremities well Wound is clean, dry, no signs of infection Swallowing better.  Continue pt, ot

## 2013-05-15 NOTE — Evaluation (Signed)
Clinical/Bedside Swallow Evaluation Patient Details  Name: Cy BlamerJohn Moster MRN: [id number] Date of Birth: 11/27/1926  Today's Date: 05/15/2013 Time: [id number] SLP Time Calculation (min): 24 min  Past Medical History:  Past Medical History  Diagnosis Date  . A-fib   . Peripheral arterial disease   . DVT (deep venous thrombosis)     history of  . Cerebral aneurysm     history of cerebral hemorrhage due to brain aneurysm rupture 25 yrs ago  . DJD (degenerative joint disease)   . Urethral stricture     history of   Past Surgical History:  Past Surgical History  Procedure Laterality Date  . Varicose vein surgery    . Appendectomy    . Prostatectomy    . Tonsillectomy and adenoidectomy    . Above knee leg amputation      left  . Suboccipital craniectomy cervical laminectomy Left 05/13/2013    Procedure: Left SUBOCCIPITAL CRANIECTOMY;  Surgeon: [name], MD;  Location: [facility];  Service: Neurosurgery;  Laterality: Left;   HPI:  80 y.o. male PMH:  A-fib, PAD, remote cerebral aneurism, and DVT. Pt brought in with his daughter, [facility]iness, stating he has trouble balancing, onset 1 day ago. Relative reports pt had acute onset of incoherency last night with slurring. Daughter lives with him.  CT acute intraparenchymal hemorrhage within the left cerebellar hemisphere.  CXR No active cardiopulmonary abnormalities, left upper lobe scarring.  Pt. with coughing episode with dinner last night and occasionaly at home per pt.  ST ordered to assess.   Assessment / Plan / Recommendation Clinical Impression  Pt. appears safe to resume a regular diet texture and thin liquids with diligent precautions including sit upright, no straws, no talking after swallow and pills whole in applesauce.  Pt. states he was using a straw during coughing episode which likely resulted in increased volume and velocity of liquid.  ST will continue to follow as he is at increased aspiration  risk due to cerebellar stroke.       Aspiration Risk  Moderate    Diet Recommendation Regular;Thin liquid   Liquid Administration via: Cup;No straw Medication Administration: Whole meds with puree Supervision: Patient able to self feed;Intermittent supervision to cue for compensatory strategies Compensations: Slow rate;Small sips/bites Postural Changes and/or Swallow Maneuvers: Seated upright 90 degrees;Upright 30-60 min after meal    Other  Recommendations Oral Care Recommendations: Oral care BID   Follow Up Recommendations   (TBD)    Frequency and Duration min 2x/week  2 weeks   Pertinent Vitals/Pain WDL         Swallow Study         Oral/Motor/Sensory Function Overall Oral Motor/Sensory Function: Appears within functional limits for tasks assessed (? slight right labial weakness)   Ice Chips Ice chips: Within functional limits   Thin Liquid Thin Liquid: Impaired Presentation: Cup Pharyngeal  Phase Impairments: Throat Clearing - Immediate;Throat Clearing - Delayed    Nectar Thick Nectar Thick Liquid: Not tested   Honey Thick Honey Thick Liquid: Not tested   Puree Puree: Within functional limits   Solid   GO    Solid: Within functional limits       Royce MacadamiaLisa Willis Caly Pellum M.Ed [employer] Pager [phone]  05/15/2013

## 2013-05-15 NOTE — Progress Notes (Signed)
OT Evaluation - late entry  At this time recommend short stay at CIR to return to PLOF to facilitate safe D/C home with intermittent S, as pt's daughter works during the day. Will benefit from skilled Ot services to faciliate D/C to CIR.   05/14/13 1500  OT Visit Information  Last OT Received On 05/14/13  History of Present Illness c/p cerebellar hemorrage. craniotomy for removal.  Precautions  Precautions Fall  Required Braces or Orthoses Other Brace/Splint (has prothetic but does not use it bc of falls)  Restrictions  Weight Bearing Restrictions No  Home Living  Family/patient expects to be discharged to: Private residence  Living Arrangements Children  Available Help at Discharge Available PRN/intermittently  Type of Home House  Home Access Ramped entrance  Home Layout Two level;Able to live on main level with bedroom/bathroom  Bathroom Nurse, children's No  Home Equipment BSC;Wheelchair - manual;Tub bench  Prior Function  Level of Independence Independent with assistive device(s);Needs assistance (daughter did meal prep)  Gait / Transfers Assistance Needed used w/c  ADL's / Homemaking Assistance Needed mod I with ADL  Communication  Communication No difficulties  Cognition  Arousal/Alertness Awake/alert  Behavior During Therapy WFL for tasks assessed/performed  Overall Cognitive Status No family/caregiver present to determine baseline cognitive functioning  Upper Extremity Assessment  Upper Extremity Assessment Generalized weakness  Lower Extremity Assessment  Lower Extremity Assessment LLE deficits/detail (LLE high AKA)  Cervical / Trunk Assessment  Cervical / Trunk Assessment Other exceptions (trunkal ataxia)  ADL  Overall ADL's  Needs assistance/impaired  Eating/Feeding Set up;Supervision/ safety (mdified diet. no straws)  Grooming Set up;Sitting  Upper Body Bathing Minimal assitance;Sitting  Lower  Body Bathing Maximal assistance;Bed level  Upper Body Dressing  Minimal assistance;Sitting  Lower Body Dressing Maximal assistance;Bed level  Toilet Transfer (not assessed due to pt declining to get OOB at this time)  Functional mobility during ADLs (will further assess)  General ADL Comments decline in function  Vision- History  Patient Visual Report Blurring of vision  Vision- Assessment  Vision Assessment? Vision impaired- to be further tested in functional context  Perception  Perception Tested? No  Praxis  Praxis tested? WFL  Bed Mobility  Overal bed mobility Needs Assistance  Bed Mobility Rolling  Rolling Min assist  General bed mobility comments using rails  Transfers  Overall transfer level Needs assistance  General transfer comment Pt declined at this time  Balance  Overall balance assessment Needs assistance  OT - End of Session  Activity Tolerance Patient limited by fatigue  Patient left in bed;in CPM  Nurse Communication Mobility status  OT Assessment  OT Recommendation/Assessment Patient needs continued OT Services  OT Problem List Decreased strength;Decreased activity tolerance;Impaired balance (sitting and/or standing);Impaired vision/perception;Decreased coordination;Decreased knowledge of use of DME or AE;Decreased knowledge of precautions;Cardiopulmonary status limiting activity;Obesity  Barriers to Discharge Decreased caregiver support (daughter works during the day)  OT Therapy Diagnosis  Generalized weakness;Acute pain;Disturbance of vision  OT Plan  OT Frequency Min 3X/week  OT Treatment/Interventions Self-care/ADL training;Therapeutic exercise;Neuromuscular education;DME and/or AE instruction;Therapeutic activities;Visual/perceptual remediation/compensation;Cognitive remediation/compensation;Patient/family education;Balance training  OT Recommendation  Recommendations for Other Services Rehab consult  Follow Up Recommendations CIR;Supervision/Assistance -  24 hour  OT Equipment None recommended by OT  Individuals Consulted  Consulted and Agree with Results and Recommendations Patient  Acute Rehab OT Goals  Patient Stated Goal to get stronger  OT Goal Formulation With patient  Time For Goal Achievement 05/29/13  Potential to Achieve Goals Good  OT Time Calculation  OT Start Time 1510  OT Stop Time 1526  OT Time Calculation (min) 16 min  Written Expression  Dominant Hand Right  Acadia Montanailary Kuron Docken, OTR/L  [phone] 05/15/2013

## 2013-05-15 NOTE — Evaluation (Signed)
Physical Therapy Evaluation Patient Details Name: [name] BlamerJohn Down MRN: [id number] DOB: 08/09/1926 Today's Date: 05/15/2013   History of Present Illness  s/p left suboccipital craniectomy for evacuation of a cerebellar hemorrhage.  Clinical Impression  Pt s/p above. Presents with decline in independence with transfers secondary to deficits indicated below. Pt to benefit from skilled acute PT to address deficits and maximize functional mobility and independence prior to D/C. Pt to be a great candidate for CIR; pt highly motivated to return to mod I for transfers. Pt was independent with lateral transfers to/from w/c prior to admission.     Follow Up Recommendations CIR    Equipment Recommendations  None recommended by PT    Recommendations for Other Services Rehab consult     Precautions / Restrictions Precautions Precautions: Fall Precaution Comments: pt with previous Lt AKA  Required Braces or Orthoses: Other Brace/Splint Other Brace/Splint: has prosthetic leg for Lt LE but reports he has not worn it in years secondary to falls  Restrictions Weight Bearing Restrictions: No      Mobility  Bed Mobility Overal bed mobility: Needs Assistance;+2 for physical assistance Bed Mobility: Sit to Supine       Sit to supine: +2 for safety/equipment;Min assist   General bed mobility comments: required (A) to control trunk and advance legs into supine position; cues for sequencing; relies on handrails   Transfers Overall transfer level: Needs assistance Equipment used: 2 person hand held assist Transfers: Sit to/from Stand;Stand Pivot Transfers Sit to Stand: +2 physical assistance;Max assist Stand pivot transfers: +2 physical assistance;Max assist       General transfer comment: pt required 2 person max (A) to stand and acheive SPT; pt leaning forward anteriorly throughout session; was able power up  through Rt LE but required 2 person to maintain balance and perform transfer to bed; pt  with ataxic like trunk movements    Ambulation/Gait             General Gait Details: pt reports he has not ambulated in many years due to falls  Stairs            Wheelchair Mobility    Modified Rankin (Stroke Patients Only)       Balance Overall balance assessment: Needs assistance;History of Falls Sitting-balance support: Bilateral upper extremity supported (Rt foot supported) Sitting balance-Leahy Scale: Poor Sitting balance - Comments: pt bracing himself on EOB with UEs   Standing balance support: During functional activity;Bilateral upper extremity supported Standing balance-Leahy Scale: Poor Standing balance comment: pt required 2 person (A) to maintain balance                              Pertinent Vitals/Pain Reports he has had HA but denies any HA at this time.    Home Living Family/patient expects to be discharged to:: Private residence Living Arrangements: Children Available Help at Discharge: Available PRN/intermittently Type of Home: House Home Access: Ramped entrance     Home Layout: Two level;Able to live on main level with bedroom/bathroom Home Equipment: Bedside commode;Wheelchair - manual;Tub bench Additional Comments: daughter works during the day    Prior Function Level of Independence: Independent with assistive device(s);Needs assistance   Gait / Transfers Assistance Needed: used w/c; reports he could transfer to/from w/c independently   ADL's / Homemaking Assistance Needed: mod I with ADL  Comments: daughter preps meals     Hand Dominance   Dominant Hand: Right  Extremity/Trunk Assessment   Upper Extremity Assessment: Defer to OT evaluation           Lower Extremity Assessment: Generalized weakness      Cervical / Trunk Assessment: Other exceptions  Communication   Communication: Other (comment) (dysarthric speech at times )  Cognition Arousal/Alertness: Awake/alert Behavior During Therapy: WFL for  tasks assessed/performed Overall Cognitive Status: No family/caregiver present to determine baseline cognitive functioning                      General Comments      Exercises        Assessment/Plan    PT Assessment Patient needs continued PT services  PT Diagnosis Generalized weakness;Acute pain   PT Problem List Decreased strength;Decreased activity tolerance;Decreased balance;Decreased mobility;Decreased knowledge of use of DME;Decreased safety awareness  PT Treatment Interventions DME instruction;Functional mobility training;Therapeutic activities;Therapeutic exercise;Balance training;Neuromuscular re-education;Patient/family education;Wheelchair mobility training   PT Goals (Current goals can be found in the Care Plan section) Acute Rehab PT Goals Patient Stated Goal: to get stronger PT Goal Formulation: With patient Time For Goal Achievement: 05/29/13 Potential to Achieve Goals: Good    Frequency Min 4X/week   Barriers to discharge Decreased caregiver support daughter works during the day     Co-evaluation               End of Session Equipment Utilized During Treatment: Gait belt Activity Tolerance: Patient tolerated treatment well Patient left: in bed;with call bell/phone within reach Nurse Communication: Mobility status;Precautions         Time: 8119-1478 PT Time Calculation (min): 15 min   Charges:   PT Evaluation $Initial PT Evaluation Tier I: 1 Procedure PT Treatments $Therapeutic Activity: 8-22 mins   PT G CodesDonell Sievert, [place] [phone] 05/15/2013, 5:01 PM

## 2013-05-15 NOTE — Progress Notes (Signed)
Rehab Admissions Coordinator Note:  Patient was screened by [name] [name] L for appropriateness for an Inpatient Acute Rehab Consult.  At this time, we are recommending Inpatient Rehab consult.  [name] [name], PT Rehabilitation Admissions Coordinator [phone]  

## 2013-05-16 LAB — PROTIME-INR
INR: 1.29 (ref 0.00–1.49)
Prothrombin Time: 15.8 seconds — ABNORMAL HIGH (ref 11.6–15.2)

## 2013-05-16 MED ORDER — ALBUTEROL SULFATE (2.5 MG/3ML) 0.083% IN NEBU
2.5000 mg | INHALATION_SOLUTION | RESPIRATORY_TRACT | Status: DC | PRN
  Administered 2013-05-16 – 2013-05-23 (×9): 2.5 mg via RESPIRATORY_TRACT
  Filled 2013-05-16 (×9): qty 3

## 2013-05-16 NOTE — Progress Notes (Signed)
Subjective: Patient reports Eels reasonably well. Some difficulty with swallowing  Objective: Vital signs in last 24 hours: Temp:  [97.5 F (36.4 C)-100.7 F (38.2 C)] 99.5 F (37.5 C) (04/04 0800) Pulse Rate:  [56-88] 72 (04/04 0800) Resp:  [18-29] 19 (04/04 0800) BP: (84-141)/(24-90) 139/82 mmHg (04/04 0800) SpO2:  [87 %-100 %] 99 % (04/04 0800)  Intake/Output from previous day: 04/03 0701 - 04/04 0700 In: 2720 [P.O.:920; I.V.:1800] Out: 1375 [Urine:1375] Intake/Output this shift:    Motor function appears intact some past pointing with both upper extremities.  Lab Results:  Recent Labs  05/13/13 1602 05/13/13 1734  WBC 10.1 11.2*  HGB 14.6 14.0  HCT 46.6 42.2  PLT  --  190   BMET  Recent Labs  05/13/13 1734  NA 140  K 3.9  CL 102  CO2 25  GLUCOSE 103*  BUN 22  CREATININE 0.89  CALCIUM 8.8    Studies/Results: No results found.  Assessment/Plan: Status post posterior fossa resection of hemorrhage day 3  LOS: 3 days  Neurologically he remained stable. Continue to observe in the intensive care unit.   Saylee Sherrill J 05/16/2013, 10:54 AM

## 2013-05-16 NOTE — Progress Notes (Signed)
PT Cancellation Note  Patient Details Name: Cy BlamerJohn Eisenberger MRN: [id number] DOB: 03/27/1926   Cancelled Treatment:    Reason Eval/Treat Not Completed: Fatigue/lethargy limiting ability to participate.  Patient declined PT today - reports fatigued and needs to sleep.  Will return in am for PT session.   Vena AustriaDavis, Harlei Lehrmann H 05/16/2013, 2:29 PM Durenda HurtSusan H. Renaldo Fiddleravis, PT, [facility] Acute Rehab Services Pager [phone]

## 2013-05-17 NOTE — Progress Notes (Signed)
Physical Therapy Treatment Patient Details Name: Cy BlamerJohn Keleher MRN: [id number] DOB: 12/16/1926 Today's Date: 05/17/2013    History of Present Illness s/p left suboccipital craniectomy for evacuation of a cerebellar hemorrhage.    PT Comments    Patient making gains with mobility and transfers.  Follow Up Recommendations  CIR     Equipment Recommendations  None recommended by PT    Recommendations for Other Services Rehab consult     Precautions / Restrictions Precautions Precautions: Fall Precaution Comments: pt with previous Lt AKA ;  performs lateral scooting transfers at home Required Braces or Orthoses: Other Brace/Splint Other Brace/Splint: has prosthetic leg for Lt LE but reports he has not worn it in years secondary to falls  Restrictions Weight Bearing Restrictions: No    Mobility  Bed Mobility Overal bed mobility: Needs Assistance;+ 2 for safety/equipment Bed Mobility: Supine to Sit     Supine to sit: Mod assist;+2 for safety/equipment     General bed mobility comments: Assist to move trunk to sitting position and to scoot to EOB.  Once upright, patient with fair sitting balance.  Transfers Overall transfer level: Needs assistance Equipment used: 2 person hand held assist Transfers: Squat Pivot Transfers     Squat pivot transfers: Max assist;+2 physical assistance     General transfer comment: Cues for hand placement with use of recliner.  Assist to raise hips to clear armrest of chair.  Patient able to pivot to chair on RLE.  Unable to reach fully upright standing position - posture flexed throughout session.  (Patient uses lateral scooting transfer bed <> w/c at home.)  Ambulation/Gait             General Gait Details: pt reports he has not ambulated in many years due to falls   Stairs            Wheelchair Mobility    Modified Rankin (Stroke Patients Only)       Balance Overall balance assessment: Needs assistance Sitting-balance  support: Single extremity supported (Rt foot supported) Sitting balance-Leahy Scale: Poor Sitting balance - Comments: pt bracing himself on EOB with UEs Postural control: Posterior lean                          Cognition Arousal/Alertness: Awake/alert Behavior During Therapy: WFL for tasks assessed/performed Overall Cognitive Status: No family/caregiver present to determine baseline cognitive functioning                      Exercises      General Comments        Pertinent Vitals/Pain Patient with headache - RN notified.    Home Living                      Prior Function            PT Goals (current goals can now be found in the care plan section) Progress towards PT goals: Progressing toward goals    Frequency  Min 4X/week    PT Plan Current plan remains appropriate    Co-evaluation             End of Session Equipment Utilized During Treatment: Gait belt;Oxygen Activity Tolerance: Patient limited by fatigue Patient left: in chair;with call bell/phone within reach     Time: 0900-0915 PT Time Calculation (min): 15 min  Charges:  $Therapeutic Activity: 8-22 mins  G Codes:      Vena Austria 05/17/2013, 12:53 PM Durenda Hurt. Renaldo Fiddler, [facility] Acute Rehab Services Pager [phone]

## 2013-05-17 NOTE — Progress Notes (Signed)
Subjective: Patient reports Patient appears alert offers some modest complaints of headache  Objective: Vital signs in last 24 hours: Temp:  [98.9 F (37.2 C)-99.8 F (37.7 C)] 99 F (37.2 C) (04/05 0352) Pulse Rate:  [62-81] 62 (04/05 0800) Resp:  [11-23] 11 (04/05 0800) BP: (124-174)/(50-95) 174/95 mmHg (04/05 0800) SpO2:  [90 %-99 %] 94 % (04/05 0800)  Intake/Output from previous day: 04/04 0701 - 04/05 0700 In: 2640 [P.O.:840; I.V.:1800] Out: 700 [Urine:700] Intake/Output this shift: Total I/O In: 300 [I.V.:300] Out: -   Motor function appears intact with pass pointing in both upper extremities.  Lab Results: No results found for this basename: WBC, HGB, HCT, PLT,  in the last 72 hours BMET No results found for this basename: NA, K, CL, CO2, GLUCOSE, BUN, CREATININE, CALCIUM,  in the last 72 hours  Studies/Results: No results found.  Assessment/Plan: Stable postoperative cerebellar hemorrhage.  LOS: 4 days  Transfer to floor.   Aviah Sorci J 05/17/2013, 11:45 AM

## 2013-05-18 DIAGNOSIS — I619 Nontraumatic intracerebral hemorrhage, unspecified: Secondary | ICD-10-CM

## 2013-05-18 NOTE — Consult Note (Signed)
Physical Medicine and Rehabilitation Consult  Reason for Consult: Dizziness with ataxia due to cerebellar hemorrhage.  Referring Physician: Dr. Lovell Sheehan.    HPI: [name] [name] is a 80 y.o. male with history of cerebral aneurysm, A fib with chronic coumadin, PAD with high L-AKA ; who was admitted on 05/13/13 with dizziness X 3 days and fatigue. CT head with left cerebellar hemorrhage with mass effect on brain stem and 4 th ventricle. Coumadin reversed and patient taken to OR emergently for left suboccipital craniectomy for evacuation of left cerebellar hematoma by Dr. Lovell Sheehan. Patient with resultant truncal ataxia, blurred vision as well as decline in mobility. Patient non-ambulatory due to fall but was able to perform level scoot transfers independently PTA. Therapy ongoing and CIR recommended by rehab team.    Review of Systems  Eyes: Positive for blurred vision.  Respiratory: Positive for cough, sputum production and wheezing. Negative for shortness of breath.   Cardiovascular: Negative for chest pain and palpitations.  Gastrointestinal: Positive for constipation. Negative for heartburn and nausea.  Genitourinary: Negative for urgency and frequency.  Musculoskeletal: Positive for myalgias.  Neurological: Positive for headaches. Negative for dizziness.     Past Medical History  Diagnosis Date  . A-fib   . Peripheral arterial disease   . DVT (deep venous thrombosis)     history of  . Cerebral aneurysm     history of cerebral hemorrhage due to brain aneurysm rupture 25 yrs ago  . DJD (degenerative joint disease)   . Urethral stricture     history of   Past Surgical History  Procedure Laterality Date  . Varicose vein surgery    . Appendectomy    . Prostatectomy    . Tonsillectomy and adenoidectomy    . Above knee leg amputation      left  . Suboccipital craniectomy cervical laminectomy Left 05/13/2013    Procedure: Left SUBOCCIPITAL CRANIECTOMY;  Surgeon: Cristi Loron, MD;  Location: [facility];  Service: Neurosurgery;  Laterality: Left;    History reviewed. No pertinent family history.  Social History:  Lives with family. Retired--used to work in a factory. Independent for transfers and ADLs. Used wheelchair for mobility. He  reports that he smoked in the service years ago.  He does not have any smokeless tobacco history on file. He reports that he does not drink alcohol or use illicit drugs.  Allergies: No Known Allergies   Medications Prior to Admission  Medication Sig Dispense Refill  . amiodarone (PACERONE) 200 MG tablet Take 100 mg by mouth daily.      . Multiple Vitamin (MULTIVITAMIN PO) Take 1 tablet by mouth daily.       Marland Kitchen warfarin (COUMADIN) 2 MG tablet Take 1-2 mg by mouth daily. Takes 2mg  on wednesdays and 1mg  all other days        Home: Home Living Family/patient expects to be discharged to:: Private residence Living Arrangements: Children Available Help at Discharge: Available PRN/intermittently Type of Home: House Home Access: Ramped entrance Home Layout: Two level;Able to live on main level with bedroom/bathroom Home Equipment: Bedside commode;Wheelchair - manual;Tub bench Additional Comments: daughter works during the day  Functional History: Prior Function Level of Independence: Independent with assistive device(s);Needs assistance Gait / Transfers Assistance Needed: used w/c; reports he could transfer to/from w/c independently  ADL's / Homemaking Assistance Needed: mod I with ADL Comments: daughter preps meals Functional Status:  Mobility: Bed Mobility Overal bed mobility: Needs Assistance;+ 2 for safety/equipment  Bed Mobility: Supine to Sit Rolling: Min assist Supine to sit: Mod assist;+2 for safety/equipment Sit to supine: +2 for safety/equipment;Min assist General bed mobility comments: Assist to move trunk to sitting position and to scoot to EOB.  Once upright, patient with fair sitting  balance. Transfers Overall transfer level: Needs assistance Equipment used: 2 person hand held assist Transfers: Squat Pivot Transfers Sit to Stand: +2 physical assistance;Max assist Stand pivot transfers: +2 physical assistance;Max assist Squat pivot transfers: Max assist;+2 physical assistance General transfer comment: Cues for hand placement with use of recliner.  Assist to raise hips to clear armrest of chair.  Patient able to pivot to chair on RLE.  Unable to reach fully upright standing position - posture flexed throughout session.  (Patient uses lateral scooting transfer bed <> w/c at home.) Ambulation/Gait General Gait Details: pt reports he has not ambulated in many years due to falls    ADL: ADL Overall ADL's : Needs assistance/impaired Eating/Feeding: Set up;Supervision/ safety (mdified diet. no straws) Grooming: Set up;Sitting Upper Body Bathing: Minimal assitance;Sitting Lower Body Bathing: Maximal assistance;Bed level Upper Body Dressing : Minimal assistance;Sitting Lower Body Dressing: Maximal assistance;Bed level Toilet Transfer:  (not assessed due to pt declining to get OOB at this time) Functional mobility during ADLs:  (will further assess) General ADL Comments: decline in function  Cognition: Cognition Overall Cognitive Status: No family/caregiver present to determine baseline cognitive functioning Orientation Level: Oriented X4 Cognition Arousal/Alertness: Awake/alert Behavior During Therapy: WFL for tasks assessed/performed Overall Cognitive Status: No family/caregiver present to determine baseline cognitive functioning  Blood pressure 122/50, pulse 63, temperature 98 F (36.7 C), temperature source Oral, resp. rate 18, height 5\' 10"  (1.778 m), weight 92.6 kg (204 lb 2.3 oz), SpO2 96.00%. Physical Exam  Nursing note and vitals reviewed. Constitutional: He is oriented to person, place, and time. He appears well-developed and well-nourished.  Rubbing his  temples and complaining of headache.   HENT:  Head: Normocephalic and atraumatic.  Eyes: Conjunctivae are normal. Pupils are equal, round, and reactive to light.  Neck: Normal range of motion. Neck supple.  Cardiovascular: Normal rate and regular rhythm.   Respiratory: Effort normal. He has wheezes (expiratory wheezes in upper airway bilaterally).  GI: Soft. Bowel sounds are normal.  Musculoskeletal:  Well healed L-AKA site.   Neurological: He is alert and oriented to person, place, and time.  Is able to follow commands without difficulty. Speech clear but distracted by headache. Left limb ataxia. Nystagmus. Able to follow most directions without substantial difficulty  Skin: Skin is warm.  Psychiatric: He has a normal mood and affect.  distractible     No results found for this or any previous visit (from the past 24 hour(s)). No results found.  Assessment/Plan: Diagnosis: left cerebellar hemorrhage s/p crani 4/1 1. Does the need for close, 24 hr/day medical supervision in concert with the patient's rehab needs make it unreasonable for this patient to be served in a less intensive setting? Yes 2. Co-Morbidities requiring supervision/potential complications: afib, PAD 3. Due to bladder management, bowel management, safety, skin/wound care, disease management, medication administration, pain management and patient education, does the patient require 24 hr/day rehab nursing? Yes 4. Does the patient require coordinated care of a physician, rehab nurse, PT (1-2 hrs/day, 5 days/week), OT (1-2 hrs/day, 5 days/week) and SLP (1-2 hrs/day, 5 days/week) to address physical and functional deficits in the context of the above medical diagnosis(es)? Yes Addressing deficits in the following areas: balance, endurance, locomotion, strength, transferring, bowel/bladder  control, bathing, dressing, feeding, grooming, toileting, cognition, speech and psychosocial support 5. Can the patient actively  participate in an intensive therapy program of at least 3 hrs of therapy per day at least 5 days per week? Yes 6. The potential for patient to make measurable gains while on inpatient rehab is excellent 7. Anticipated functional outcomes upon discharge from inpatient rehab are modified independent and supervision  with PT, modified independent and supervision with OT, modified independent with SLP. 8. Estimated rehab length of stay to reach the above functional goals is: afib, ICH 9. Does the patient have adequate social supports to accommodate these discharge functional goals? Yes and Potentially 10. Anticipated D/C setting: Home 11. Anticipated post D/C treatments: HH therapy and Outpatient therapy 12. Overall Rehab/Functional Prognosis: good  RECOMMENDATIONS: This patient's condition is appropriate for continued rehabilitative care in the following setting: CIR Patient has agreed to participate in recommended program. Yes Note that insurance prior authorization may be required for reimbursement for recommended care.  Comment: Rehab Admissions Coordinator to follow up.  Thanks,  Ranelle OysterZachary T. Shaquoia Miers, MD, [place] DomFAAPMR     05/18/2013

## 2013-05-18 NOTE — Progress Notes (Signed)
Patient ID: Cy BlamerJohn Iseminger, male   DOB: 03/28/1926, 80 y.o.   MRN: [id number] Subjective:  The patient is alert and pleasant. He is in no apparent distress.  Objective: Vital signs in last 24 hours: Temp:  [97.8 F (36.6 C)-99.9 F (37.7 C)] 98 F (36.7 C) (04/06 0932) Pulse Rate:  [63-82] 63 (04/06 0932) Resp:  [13-18] 18 (04/06 0932) BP: (101-178)/(50-88) 122/50 mmHg (04/06 0932) SpO2:  [94 %-99 %] 96 % (04/06 0932)  Intake/Output from previous day: 04/05 0701 - 04/06 0700 In: 615 [P.O.:240; I.V.:375] Out: 750 [Urine:750] Intake/Output this shift:    Physical exam the patient is alert and oriented x3. He is moving all 4 extremities well. His incision is healing well. There is no drainage.  Lab Results: No results found for this basename: WBC, HGB, HCT, PLT,  in the last 72 hours BMET No results found for this basename: NA, K, CL, CO2, GLUCOSE, BUN, CREATININE, CALCIUM,  in the last 72 hours  Studies/Results: No results found.  Assessment/Plan: Postop day #5: I'll ask rehabilitation to see the patient. I've spoken with the patient's family.  LOS: 5 days     Dezirea Mccollister D 05/18/2013, 11:45 AM

## 2013-05-18 NOTE — Progress Notes (Signed)
Occupational Therapy Treatment Patient Details Name: [name] [name] MRN: [id number] DOB: 09-May-1926 Today's Date: 05/18/2013    History of present illness s/p left suboccipital craniectomy for evacuation of a cerebellar hemorrhage.   OT comments  Patient received supine in bed, patient with goals of hygiene, bathing seated.  Patient required +2 assistance to maintain sitting balance on edge of bed due to dizziness, fear of falling, and headache.  Patient cooperative.  Needs additional OT to address sitting balance as related to ADL, and functional transfers, e.g, commode.  Follow Up Recommendations  CIR;Supervision/Assistance - 24 hour    Equipment Recommendations  None recommended by OT    Recommendations for Other Services Rehab consult    Precautions / Restrictions Precautions Precautions: Fall Precaution Comments: pt with previous Lt AKA ;  performs lateral scooting transfers at home Restrictions Weight Bearing Restrictions: No Other Position/Activity Restrictions: L AKA- doesn't wear prosthetic       Mobility Bed Mobility Overal bed mobility: Needs Assistance;+2 for physical assistance Bed Mobility: Supine to Sit;Rolling Rolling: Mod assist   Supine to sit: Total assist;+2 for physical assistance Sit to supine: Total assist;+2 for physical assistance   General bed mobility comments: Very fearful of falling forward  Transfers                      Balance Overall balance assessment: Needs assistance Sitting-balance support: Bilateral upper extremity supported;Feet supported (L AKA) Sitting balance-Leahy Scale: Zero Sitting balance - Comments: pt bracing himself on EOB with UEs Postural control: Left lateral lean;Posterior lean                         ADL Overall ADL's : Needs assistance/impaired Eating/Feeding: Set up;Minimal assistance;Bed level (Assist to guide hand to mouth, with HOB up.  Strong coughing episode after sipping water.  Patient aware  no straws!)   Grooming: Sitting;Moderate assistance (unsupported)                                 General ADL Comments: Patient with significant dizziness in sitting.  Maintained eyes closed to reduce symptoms, strong pull toward left in sitting, resisted weight shift to right      Vision                     Perception Perception Perception Tested?: Yes Spatial deficits: Very fearful of forward weight translation, pushed backwards strongly   Praxis Praxis Praxis tested?: Not tested    Cognition   Behavior During Therapy: Anxious Overall Cognitive Status: Impaired/Different from baseline (tangential speech, lots of joking, did not answer direct questions - used humor) Area of Impairment: Attention;Safety/judgement;Awareness   Current Attention Level: Sustained      Safety/Judgement: Decreased awareness of safety Awareness: Intellectual        Extremity/Trunk Assessment               Exercises     Shoulder Instructions       General Comments      Pertinent Vitals/ Pain       7-8/10 headache left temple.  Patient returned to supine for comfort  Home Living                                          Prior Functioning/Environment  Frequency Min 3X/week     Progress Toward Goals  OT Goals(current goals can now be found in the care plan section)  Progress towards OT goals: OT to reassess next treatment     Plan Discharge plan remains appropriate    Co-evaluation                 End of Session     Activity Tolerance Patient limited by pain   Patient Left in bed;with bed alarm set;with call bell/phone within reach   Nurse Communication          Time: [id number] OT Time Calculation (min): 33 min  Charges: OT General Charges $OT Visit: 1 Procedure OT Treatments $Neuromuscular Re-education: 23-37 mins  Collier SalinaGellert, Britian Jentz M 05/18/2013, 2:59 PM

## 2013-05-18 NOTE — Progress Notes (Signed)
Speech Language Pathology Treatment: Dysphagia  Patient Details Name: [name] BlamerJohn [name] MRN: [id number] DOB: 10/10/1926 Today's Date: 05/18/2013 Time: [id number] SLP Time Calculation (min): 20 min  Assessment / Plan / Recommendation Clinical Impression  Pt observed with lunch. No s/s of aspiration occurred today during meal. Pt recalled about 50% of precautions from previous bedside eval; reviewed taking small bites and sips, staying upright after meal, no straws. Recommend continuing with regular diet with meds in puree. Given success with regular diet and no s/s of aspiration, speech therapy will sign off at this time. Please re-consult if needed.   HPI HPI: 80 y.o. male PMH:  A-fib, PAD, remote cerebral aneurism, and DVT. Pt brought in with his daughter, [facility], stating he has trouble balancing, onset 1 day ago. Relative reports pt had acute onset of incoherency last night with slurring. Daughter lives with him.  CT acute intraparenchymal hemorrhage within the left cerebellar hemisphere.  CXR No active cardiopulmonary abnormalities, left upper lobe scarring.  Pt. with coughing episode with dinner last night and occasionaly at home per pt.  ST ordered to assess.   Pertinent Vitals N/A  SLP Plan       Recommendations Diet recommendations: Regular;Thin liquid Liquids provided via: Cup Medication Administration: Whole meds with puree Supervision: Patient able to self feed;Intermittent supervision to cue for compensatory strategies Compensations: Slow rate;Small sips/bites Postural Changes and/or Swallow Maneuvers: Seated upright 90 degrees;Upright 30-60 min after meal                   GO     [name] [name] K, MA, CCC-SLP 05/18/2013, 1:20 PM

## 2013-05-19 LAB — BASIC METABOLIC PANEL
BUN: 8 mg/dL (ref 6–23)
CO2: 25 mEq/L (ref 19–32)
Calcium: 8.4 mg/dL (ref 8.4–10.5)
Chloride: 104 mEq/L (ref 96–112)
Creatinine, Ser: 0.7 mg/dL (ref 0.50–1.35)
GFR calc Af Amer: >90 mL/min (ref >90)
GFR calc non Af Amer: 84 mL/min — ABNORMAL LOW (ref >90)
Glucose, Bld: 113 mg/dL — ABNORMAL HIGH (ref 70–99)
Potassium: 3.8 mEq/L (ref 3.7–5.3)
Sodium: 142 mEq/L (ref 137–147)

## 2013-05-19 LAB — CBC WITH DIFFERENTIAL/PLATELET
Basophils Absolute: 0 10*3/uL (ref 0.0–0.1)
Basophils Relative: 0 % (ref 0–1)
Eosinophils Absolute: 0.4 10*3/uL (ref 0.0–0.7)
Eosinophils Relative: 4 % (ref 0–5)
HCT: 35.9 % — ABNORMAL LOW (ref 39.0–52.0)
Hemoglobin: 12.1 g/dL — ABNORMAL LOW (ref 13.0–17.0)
Lymphocytes Relative: 14 % (ref 12–46)
Lymphs Abs: 1.2 10*3/uL (ref 0.7–4.0)
MCH: 28.5 pg (ref 26.0–34.0)
MCHC: 33.7 g/dL (ref 30.0–36.0)
MCV: 84.7 fL (ref 78.0–100.0)
Monocytes Absolute: 1.3 10*3/uL — ABNORMAL HIGH (ref 0.1–1.0)
Monocytes Relative: 15 % — ABNORMAL HIGH (ref 3–12)
Neutro Abs: 5.7 10*3/uL (ref 1.7–7.7)
Neutrophils Relative %: 67 % (ref 43–77)
Platelets: 138 10*3/uL — ABNORMAL LOW (ref 150–400)
RBC: 4.24 MIL/uL (ref 4.22–5.81)
RDW: 13.9 % (ref 11.5–15.5)
WBC: 8.5 10*3/uL (ref 4.0–10.5)

## 2013-05-19 NOTE — Clinical Social Work Psychosocial (Signed)
Clinical Social Work Department BRIEF PSYCHOSOCIAL ASSESSMENT 05/19/2013  Patient:  [name] [name],[name] [name]     Account Number:  [id number]     Admit date:  05/13/2013  Clinical Social Worker:  Donna Christen  Date/Time:  05/19/2013 03:02 PM  Referred by:  Physician  Date Referred:  05/19/2013 Referred for  SNF Placement   Other Referral:   none.   Interview type:  Patient Other interview type:   CSW has left message with pt's daughter, [name] [name]. [phone])    PSYCHOSOCIAL DATA Living Status:  FAMILY Admitted from facility:   Level of care:   Primary support name:  [name] [name] Primary support relationship to patient:  CHILD, ADULT Degree of support available:   Strong support system.    CURRENT CONCERNS Current Concerns  Post-Acute Placement   Other Concerns:   none.    SOCIAL WORK ASSESSMENT / PLAN CSW received consult regarding possible SNF placement at time of discharge. Per CIR admissions liaison, pt was denied for CIR placement. CSW met with pt at bedside to discuss SNF placement. Pt requested CSW speak with pt's daughter, [name] [name], regarding discharge disposition.    CSW spoke with [name] [name] regarding discharge disposition. [name] [name] stated that she would like for pt to be placed in SNF at time of discharge. CSW will continue to follow and assist with discharge planning needs.   Assessment/plan status:  Psychosocial Support/Ongoing Assessment of Needs Other assessment/ plan:   none.   Information/referral to community resources:   [facility] bed offers.    PATIENTS/FAMILYS RESPONSE TO PLAN OF CARE: Pt and pt's daughter understanding and agreeable to CSW plan of care.       [name] [name], [name] [name] Social Worker [phone]

## 2013-05-19 NOTE — Clinical Social Work Placement (Addendum)
Clinical Social Work Department CLINICAL SOCIAL WORK PLACEMENT NOTE 05/19/2013  Patient:  [name] [name],[name] [name]  Account Number:  [id number] Admit date:  05/13/2013  Clinical Social Worker:  Irving BurtonEMILY SUMM[place]IL[place], LCSWA  Date/time:  05/19/2013 03:16 PM  Clinical Social Work is seeking post-discharge placement for this patient at the following level of care:   SKILLED NURSING   (*CSW will update this form in Epic as items are completed)   05/19/2013  [facility]/family provided with [facility] Clinical [facility] Work's list of facilities offering this level of care within the geographic area requested by the patient (or if unable, by the patient's family).  05/19/2013  Patient/family informed of their freedom to choose among providers that offer the needed level of care, that participate in Medicare, Medicaid or managed care program needed by the patient, have an available bed and are willing to accept the patient.  05/19/2013  Patient/family informed of [facility]' ownership interest in [facility], as well as of the fact that they are under no obligation to receive care at this facility.  PASARR submitted to EDS on 05/19/2013 PASARR number received from EDS on 05/19/2013  FL2 transmitted to all facilities in geographic area requested by pt/family on  05/19/2013 FL2 transmitted to all facilities within larger geographic area on   Patient informed that his/her managed care company has contracts with or will negotiate with  certain facilities, including the following:     Patient/family informed of bed offers received:  05/20/2013 [facility] and patient chooses bed at    Patient to be transferred to [facility] on 05/23/13- Jetta LoutBailey Morgan, LCSWA   Patient to be transferred to facility by [facility]- Jetta LoutBailey Morgan, LCSWA   The following physician request were entered in Epic:   Additional Comments:  Sherre LainEmily [place], LCSWA Clinical  Social Worker [phone]

## 2013-05-19 NOTE — Progress Notes (Signed)
Occupational Therapy Treatment Patient Details Name: [name] BlamerJohn Hearld MRN: [id number] DOB: 03/26/1926 Today's Date: 05/19/2013    History of present illness s/p left suboccipital craniectomy for evacuation of a cerebellar hemorrhage.   OT comments  Pt making progress with functional goals ans should continue with acute OT services to increase level of function and safety. Upon entering room, PT assisting pt to recliner with +2 total A  Follow Up Recommendations  CIR;Supervision/Assistance - 24 hour    Equipment Recommendations  None recommended by OT    Recommendations for Other Services Rehab consult    Precautions / Restrictions Precautions Precautions: Fall Precaution Comments: pt with previous Lt AKA ;  performs lateral scooting transfers at home Required Braces or Orthoses: Other Brace/Splint Other Brace/Splint: has prosthetic leg for Lt LE but reports he has not worn it in years secondary to falls  Restrictions Weight Bearing Restrictions: No       Mobility Bed Mobility Overal bed mobility: Needs Assistance;+2 for physical assistance Bed Mobility: Supine to Sit;Sidelying to Sit   Sidelying to sit: Mod assist Supine to sit: Total assist;+2 for physical assistance     General bed mobility comments: Pt EOB with PT assisting with transfer to recliner by lateral scoot upon entering room. +2 total A was required  Transfers Overall transfer level: Needs assistance   Transfers: Licensed conveyancerAnterior-Posterior Transfer       Anterior-Posterior transfers: +2 physical assistance;Mod assist   General transfer comment: PT assisting with transfer to recliner by lateral scoot upon entering room. +2 total A was required    Balance Overall balance assessment: Needs assistance Sitting-balance support: Bilateral upper extremity supported;Feet supported (L AKA) Sitting balance-Leahy Scale: Fair                          ADL       Grooming: Wash/dry hands;Wash/dry face;Minimal  assistance   Upper Body Bathing: Sitting;Min guard;Set up;Supervision/ safety       Upper Body Dressing : Sitting;Min guard;Set up;Supervision/safety                     General ADL Comments: Pt required increased time to complete bathing and dressing tasks due to fixation on phone and TV remote      Vision  wears glasses                                 Behavior During Therapy: [facility], LLCWFL for tasks assessed/performed Overall Cognitive Status: Within Functional Limits for tasks assessed Area of Impairment: Attention          Safety/Judgement: Decreased awareness of safety Awareness: Intellectual   General Comments: Patient perseverated on use of phone and tv remote                               General Comments  Pt pleasant and cooperative    Pertinent Vitals/ Pain       7/10 c/o headache pain                                                          Frequency Min 3X/week     Progress Toward Goals  OT Goals(current goals can now be found in the care plan section)  Progress towards OT goals: Progressing toward goals     Plan Discharge plan remains appropriate                     End of Session     Activity Tolerance Patient tolerated treatment well   Patient Left in chair;with call bell/phone within reach             Time: 1050-1117 OT Time Calculation (min): 27 min  Charges: OT General Charges $OT Visit: 1 Procedure OT Treatments $Self Care/Home Management : 23-37 mins  Galen Manila 05/19/2013, 2:33 PM

## 2013-05-19 NOTE — Progress Notes (Signed)
Seen and agreed 05/19/2013 Amanda Steuart Elizabeth PTA [phone] pager [phone] office    

## 2013-05-19 NOTE — Progress Notes (Signed)
UR COMPLETED  

## 2013-05-19 NOTE — Progress Notes (Signed)
Rehab admissions - Evaluated for possible admission.  Patient is confused this am.  I did call his daughter and am awaiting a call back from daughter regarding rehab options.  I suspect that patient will need SNF placement but I need to confirm this with the daughter.  Call me for questions.  #[phone]

## 2013-05-19 NOTE — Progress Notes (Signed)
Physical Therapy Treatment Patient Details Name: [name] BlamerJohn [name] MRN: [id number] DOB: 11/25/1926 Today's Date: 05/19/2013    History of Present Illness s/p left suboccipital craniectomy for evacuation of a cerebellar hemorrhage.    PT Comments    Patient progressing towards goals but still requires A for transfers, bed mobility and cueing for posture. Continue to recommend CIR for patient to increase safety and proper technique. Attempted A/P transfer this session with scooting technique as patient is using scooting technique at home. Try lateral transfer with drop arm chair next treatment.   Follow Up Recommendations  CIR     Equipment Recommendations  None recommended by PT    Recommendations for Other Services       Precautions / Restrictions Precautions Precautions: Fall Precaution Comments: pt with previous Lt AKA ;  performs lateral scooting transfers at home Other Brace/Splint: has prosthetic leg for Lt LE but reports he has not worn it in years secondary to falls  Restrictions Weight Bearing Restrictions: No    Mobility  Bed Mobility Overal bed mobility: Needs Assistance;+2 for physical assistance Bed Mobility: Supine to Sit;Sidelying to Sit   Sidelying to sit: Mod assist Supine to sit: Total assist;+2 for physical assistance     General bed mobility comments: Patient required cues for hand placement and proper technique with bed mobility. Patient required A to power trunk and UE to sitting posture from supine.   Transfers Overall transfer level: Needs assistance   Transfers: Licensed conveyancerAnterior-Posterior Transfer       Anterior-Posterior transfers: +2 physical assistance;Mod assist   General transfer comment: Patient required cues for EOB posture and for hand placement during transfer.   Ambulation/Gait                 Stairs            Wheelchair Mobility    Modified Rankin (Stroke Patients Only)       Balance Overall balance assessment: Needs  assistance Sitting-balance support: Bilateral upper extremity supported;Feet supported (L AKA) Sitting balance-Leahy Scale: Zero Sitting balance - Comments: pt bracing himself on EOB with UEs. Patient required cue with upright posture Postural control: Posterior lean                          Cognition Arousal/Alertness: Awake/alert Behavior During Therapy: Anxious Overall Cognitive Status: Impaired/Different from baseline Area of Impairment: Attention;Safety/judgement;Awareness         Safety/Judgement: Decreased awareness of safety Awareness: Intellectual   General Comments: Patient perseverated on use of phone throughout treatment    Exercises      General Comments        Pertinent Vitals/Pain Patient denies pain    Home Living                      Prior Function            PT Goals (current goals can now be found in the care plan section) Progress towards PT goals: Progressing toward goals    Frequency  Min 4X/week    PT Plan Current plan remains appropriate    Co-evaluation             End of Session   Activity Tolerance: Patient limited by fatigue Patient left: in chair;with call bell/phone within reach     Time: 1030-1053 PT Time Calculation (min): 23 min  Charges:  G Codes:      Anhelica Fowers, SPTA 05/19/2013, 11:29 AM

## 2013-05-20 MED ORDER — ENSURE COMPLETE PO LIQD
237.0000 mL | Freq: Two times a day (BID) | ORAL | Status: DC
  Administered 2013-05-20 – 2013-05-22 (×4): 237 mL via ORAL

## 2013-05-20 NOTE — Progress Notes (Signed)
Rehab admissions - I spoke with daughter this am.  She is in agreement to SNF level rehab.  I will sign off for inpatient rehab.  Call me for questions.  #[phone]

## 2013-05-20 NOTE — Progress Notes (Signed)
NUTRITION FOLLOW UP  Intervention:   Provide Ensure Complete BID Downgrade diet to Dysphagia 3 (pt reports chewing difficulty) Encourage PO intake  Nutrition Dx:   Increased nutrient needs related to surgery as evidenced by estimated needs; ongoing  Goal:   Pt to meet >/= 90% of their estimated nutrition needs; unmet  Monitor:   Diet advancement, PO intake, weight trends  Assessment:   Pt s/p left suboccipital craniectomy for evacuation of left cerebellar hematoma 4/1. Pt remains NPO. Pt has headache (notified RN). Pt is unsure of his weight hx, states that he does not weight himself much. Pt with left AKA.   Pt's diet advanced to regular on 4/3. Pt states that his appetite is fair. He reports eating less than usual due to difficulty chewing and difficulty reaching food; states nursing staff have been assisting him at meal times. Pt reports drinking Ensure supplements in the past and is agreeable to receiving them during the remainder of his hospitalization. Limited meal completion documented has been 25%.  Today's weight using bed scale was 202 lbs.  Labs reviewed.   Height: Ht Readings from Last 1 Encounters:  05/14/13 5\' 10"  (1.778 m)    Weight Status:   Wt Readings from Last 1 Encounters:  05/14/13 204 lb 2.3 oz (92.6 kg)    Re-estimated needs:  Kcal: 1800-2000  Protein: 90-100 grams  Fluid: > 1.8 L/day  Skin: closed head incision  Diet Order: General   Intake/Output Summary (Last 24 hours) at 05/20/13 1358 Last data filed at 05/20/13 1300  Gross per 24 hour  Intake      0 ml  Output    975 ml  Net   -975 ml    Last BM: 4/5   Labs:   Recent Labs Lab 05/13/13 1734 05/19/13 0800  NA 140 142  K 3.9 3.8  CL 102 104  CO2 25 25  BUN 22 8  CREATININE 0.89 0.70  CALCIUM 8.8 8.4  GLUCOSE 103* 113*    CBG (last 3)  No results found for this basename: GLUCAP,  in the last 72 hours  Scheduled Meds: . amiodarone  100 mg Oral Daily  . docusate sodium   100 mg Oral BID  . pantoprazole  40 mg Oral Daily    Continuous Infusions: . 0.9 % NaCl with KCl 20 mEq / L 75 mL/hr at 05/18/13 2222    Ian Malkineanne Barnett RD, LDN Inpatient Clinical Dietitian Pager: [phone] After Hours Pager: [phone]

## 2013-05-21 NOTE — Progress Notes (Signed)
Agree with SPTA.    Nitza Schmid, PT [phone]  

## 2013-05-21 NOTE — Clinical Social Work Note (Signed)
Pt's daughter has confirmed with CSW that pt and pt's family [facility] at time of discharge. [facility] information and bed choice for SNF authorization. CSW [facility] and [facility]'s family [facility].  CSW to continue to follow and assist with discharge planning needs.  Darlyn ChamberEmily [place], LCSWA Clinical Social Worker [phone]

## 2013-05-21 NOTE — Progress Notes (Signed)
Patient has complained of head ache for the last two nights on a consistent basis and does not seem to obtain relief from current single tablet PRN 5-325 Vicodin.

## 2013-05-21 NOTE — Progress Notes (Signed)
Physical Therapy Treatment Patient Details Name: [name] BlamerJohn [name] MRN: [id number] DOB: 09/25/1926 Today's Date: 05/21/2013    History of Present Illness s/p left suboccipital craniectomy for evacuation of a cerebellar hemorrhage.    PT Comments    Patient able to tolerate therapy today. Patient more alert and aware to A with therapy. Patient progressing towards goals but still requires A for bed mobility and safety. Recommend SNF at this time.   Follow Up Recommendations  SNF     Equipment Recommendations       Recommendations for Other Services       Precautions / Restrictions Precautions Precautions: Fall Precaution Comments: pt with previous Lt AKA ;  performs lateral scooting transfers at home Other Brace/Splint: has prosthetic leg for Lt LE but reports he has not worn it in years secondary to falls     Mobility  Bed Mobility Overal bed mobility: Needs Assistance;+2 for physical assistance Bed Mobility: Supine to Sit;Sidelying to Sit Rolling: Min assist Sidelying to sit: Mod assist Supine to sit: Total assist;+2 for physical assistance     General bed mobility comments: Patient required A to help power up sidelying to sit position. Patient required cues for hand placement. +2 A was required to get EOB  Transfers Overall transfer level: Needs assistance   Transfers: Licensed conveyancerAnterior-Posterior Transfer       Anterior-Posterior transfers: +2 physical assistance;Mod assist   General transfer comment: Mod A +2 required for physcial assistance with A/P transfer. Patient required cues for safe technique. Patient able to particapte more.  Ambulation/Gait                 Stairs            Wheelchair Mobility    Modified Rankin (Stroke Patients Only)       Balance Overall balance assessment: Needs assistance Sitting-balance support: Bilateral upper extremity supported;Feet supported (L AKA) Sitting balance-Leahy Scale: Poor Sitting balance - Comments: Patient  able to support himself EOB with UE. Patient required cues for upright posture. Postural control: Posterior lean                          Cognition Arousal/Alertness: Awake/alert Behavior During Therapy: WFL for tasks assessed/performed Overall Cognitive Status: Within Functional Limits for tasks assessed                 General Comments: Patient more alert and aware of surroundings today    Exercises      General Comments        Pertinent Vitals/Pain Patient denies pain.    Home Living                      Prior Function            PT Goals (current goals can now be found in the care plan section) Progress towards PT goals: Progressing toward goals    Frequency       PT Plan Discharge plan needs to be updated    Co-evaluation             End of Session   Activity Tolerance: Patient tolerated treatment well Patient left: in chair;with call bell/phone within reach;with chair alarm set     Time: [id number] PT Time Calculation (min): 16 min  Charges:                       G Codes:  Kyion Gautier L Johnathan Heskett, SPTA 05/21/2013, 10:15 AM

## 2013-05-22 NOTE — Progress Notes (Signed)
Patient ID: [name] BlamerJohn [name], male   DOB: 12/19/1926, 80 y.o.   MRN: [id number] Alert, oriented x 4, speech is clear Wound is clean, dry, no signs of infection Moving all extremities Perrl, full eom Symmetric facies Continuing to improve, rehab consulted. Continue pt, ot

## 2013-05-22 NOTE — Progress Notes (Signed)
Patient ID: [name] BlamerJohn Hoiland, male   DOB: 07/05/1926, 80 y.o.   MRN: [id number] Alert oriented x 4, speech is clear, fluent Wound with some fluctuance, no leak perrl full eom Symmetric facies, tongue and uvula midline Mild past pointing left hand Continue with PT, OT

## 2013-05-22 NOTE — Progress Notes (Signed)
Patient ID: [name] BlamerJohn [name], male   DOB: 11/17/1926, 80 y.o.   MRN: [id number] BP 118/58  Pulse 83  Temp(Src) 97.5 F (36.4 C) (Oral)  Resp 18  Ht 5\' 10"  (1.778 m)  Wt 92.6 kg (204 lb 2.3 oz)  BMI 29.29 kg/m2  SpO2 99% Alert oriented x 4 Moving all extremities Wound is clean, dry, no signs of infection. Doing well, discharge planning points [facility].

## 2013-05-22 NOTE — Progress Notes (Signed)
Patient ID: [name] BlamerJohn [name], male   DOB: 05/01/1926, 80 y.o.   MRN: [id number] Mr. [name] [name] continues to improve. Wound is clean, dry, without signs of infection Answering all questions appropiately Moving all extremities Waiting for placement.

## 2013-05-23 ENCOUNTER — Emergency Department (HOSPITAL_COMMUNITY)
Admission: EM | Admit: 2013-05-23 | Discharge: 2013-05-24 | Disposition: A | Discharge status: Home / Self Care | Payer: Medicare Other | Attending: Emergency Medicine | Admitting: Emergency Medicine

## 2013-05-23 ENCOUNTER — Emergency Department (HOSPITAL_COMMUNITY): Payer: Medicare Other

## 2013-05-23 ENCOUNTER — Encounter (HOSPITAL_COMMUNITY): Payer: Self-pay | Admitting: Emergency Medicine

## 2013-05-23 DIAGNOSIS — Z87448 Personal history of other diseases of urinary system: Secondary | ICD-10-CM | POA: Insufficient documentation

## 2013-05-23 DIAGNOSIS — S78119A Complete traumatic amputation at level between unspecified hip and knee, initial encounter: Secondary | ICD-10-CM | POA: Insufficient documentation

## 2013-05-23 DIAGNOSIS — Y9210 Unspecified residential institution as the place of occurrence of the external cause: Secondary | ICD-10-CM | POA: Insufficient documentation

## 2013-05-23 DIAGNOSIS — Z79899 Other long term (current) drug therapy: Secondary | ICD-10-CM | POA: Insufficient documentation

## 2013-05-23 DIAGNOSIS — Z8673 Personal history of transient ischemic attack (TIA), and cerebral infarction without residual deficits: Secondary | ICD-10-CM | POA: Insufficient documentation

## 2013-05-23 DIAGNOSIS — Z043 Encounter for examination and observation following other accident: Secondary | ICD-10-CM | POA: Insufficient documentation

## 2013-05-23 DIAGNOSIS — Z86718 Personal history of other venous thrombosis and embolism: Secondary | ICD-10-CM | POA: Insufficient documentation

## 2013-05-23 DIAGNOSIS — M1990 Unspecified osteoarthritis, unspecified site: Secondary | ICD-10-CM | POA: Insufficient documentation

## 2013-05-23 DIAGNOSIS — S88119A Complete traumatic amputation at level between knee and ankle, unspecified lower leg, initial encounter: Secondary | ICD-10-CM | POA: Insufficient documentation

## 2013-05-23 DIAGNOSIS — I4891 Unspecified atrial fibrillation: Secondary | ICD-10-CM | POA: Insufficient documentation

## 2013-05-23 DIAGNOSIS — Y9389 Activity, other specified: Secondary | ICD-10-CM | POA: Insufficient documentation

## 2013-05-23 DIAGNOSIS — R296 Repeated falls: Secondary | ICD-10-CM | POA: Insufficient documentation

## 2013-05-23 DIAGNOSIS — W010XXA Fall on same level from slipping, tripping and stumbling without subsequent striking against object, initial encounter: Secondary | ICD-10-CM

## 2013-05-23 HISTORY — DX: Cerebral infarction, unspecified: I63.9

## 2013-05-23 MED ORDER — HYDROCODONE-ACETAMINOPHEN 5-325 MG PO TABS: 1.0000 | ORAL_TABLET | ORAL | Status: DC | PRN

## 2013-05-23 NOTE — Progress Notes (Addendum)
Patient is medically stable for D/C to [facility] today. Janie admissions coordinator at [facility] met with patient's daughter [name] today and completed paper work. Narda Rutherford reported that patient is clear to come today. Clinical Education officer, museum (CSW) prepared D/C packet and arranged non-emergency EMS ([facility]) for transport. Per weekday handoff report patient received blue medicare authorization yesterday. CSW gave Patrice Paradise # [phone] RUC. Please reconsult if further social work needs arise. CSW signing off.   Blima Rich, [place] Weekend CSW [phone]

## 2013-05-23 NOTE — Discharge Summary (Signed)
Physician Discharge Summary  Patient ID: [name] BlamerJohn Bergin MRN: [id number] DOB/AGE: 78/05/1926 78 y.o.  Admit date: 05/13/2013 Discharge date: 05/23/2013  Admission Diagnoses:cerebellar hemorrhage, atrial fibrillation, chronic anticoagulation with warfarin  Discharge Diagnoses: Same Active Problems:   Cerebellar hemorrhage, acute   Discharged Condition: fair  Hospital Course: Underwent craniectomy and evacuation of cerebellar hemorrhage with postop PT/OT/speech therapies  Consults: None  Significant Diagnostic Studies: none  Treatments: surgery: suboccipital craniectomy and evacuation of cerebellar hemorrhage  Discharge Exam: Blood pressure 154/61, pulse 82, temperature 97.4 F (36.3 C), temperature source Oral, resp. rate 18, height 5\' 10"  (1.778 m), weight 92.6 kg (204 lb 2.3 oz), SpO2 98.00%. Neurologic: Alert and oriented X 3, normal strength and tone. Normal symmetric reflexes. Poor coordination and gait Wound:CDI  Disposition: SNF   Future Appointments Provider Department Dept Phone   05/28/2013 11:30 AM Peter M [place], MD [facility] [address] Office [phone]       Medication List    STOP taking these medications       warfarin 2 MG tablet  Commonly known as:  COUMADIN      TAKE these medications       amiodarone 200 MG tablet  Commonly known as:  PACERONE  Take 100 mg by mouth daily.     HYDROcodone-acetaminophen 5-325 MG per tablet  Commonly known as:  NORCO/VICODIN  Take 1 tablet by mouth every 4 (four) hours as needed for moderate pain.     MULTIVITAMIN PO  Take 1 tablet by mouth daily.         Signed: Maeola HarmanJoseph Jarvis Knodel, MD 05/23/2013, 11:19 AM

## 2013-05-23 NOTE — Progress Notes (Signed)
Patient's [name] is completing paper work [facility] [place] [facility] at 1:30 today. Patient will D/C after paper work is completed. CSW will assist with D/C.   Jetta LoutBailey Morgan, LCSWA Weekend CSW [phone]

## 2013-05-23 NOTE — Progress Notes (Signed)
Subjective: Patient reports doing OK  Objective: Vital signs in last 24 hours: Temp:  [97.4 F (36.3 C)-98 F (36.7 C)] 97.4 F (36.3 C) (04/11 0942) Pulse Rate:  [75-90] 82 (04/11 0942) Resp:  [18] 18 (04/11 0942) BP: (105-154)/(53-89) 154/61 mmHg (04/11 0942) SpO2:  [93 %-99 %] 98 % (04/11 0942)  Intake/Output from previous day: 04/10 0701 - 04/11 0700 In: 880 [P.O.:880] Out: 1200 [Urine:1200] Intake/Output this shift:    Physical Exam: Up in bed.  Alert and conversant.  Lab Results: No results found for this basename: WBC, HGB, HCT, PLT,  in the last 72 hours BMET No results found for this basename: NA, K, CL, CO2, GLUCOSE, BUN, CREATININE, CALCIUM,  in the last 72 hours  Studies/Results: No results found.  Assessment/Plan: Transfer to SNF today. F/U in office with Dr. Lovell SheehanJenkins in one month    LOS: 10 days    Maeola HarmanJoseph Aviraj Kentner, MD 05/23/2013, 11:05 AM

## 2013-05-23 NOTE — Discharge Instructions (Signed)

## 2013-05-23 NOTE — Progress Notes (Signed)
Patient has been laying against his incision in the bed; small amount of serous drainage noted on the pillow case; his sutures are intact; the incision is pink; the sutures are embedded into the skin; patient ready for transport to the SNF; Paged on call MD regarding the incision and drainage noted; and sutures. Orders for the SNF to follow up with suture removal.  Cherie,RN called at the SNF; to follow up on suture removal and incision care needs.

## 2013-05-23 NOTE — ED Notes (Signed)
To ED from nursing home via [facility], dc today following hemorragic stroke and craniotomy, today had a mechanical fall, no LOC, is A/O on arrival, denies pain or SOB, VSS, NAD

## 2013-05-23 NOTE — ED Provider Notes (Signed)
CSN: [id number]     Arrival date & time 05/23/13  2122 History   First MD Initiated Contact with Patient 05/23/13 2300     Chief Complaint  Patient presents with  . Fall     (Consider location/radiation/quality/duration/timing/severity/associated sxs/prior Treatment) HPI This is an 80 year old man with multiple chronic medical problems who was discharged from this hospital recently to a skilled nursing facility.) By ambulance after a fall. The patient is status post left BKA. He stood up foot and fell backward. He denies head trauma. He denies pain in any region. He denies loss of consciousness.   Past Medical History  Diagnosis Date  . A-fib   . Peripheral arterial disease   . DVT (deep venous thrombosis)     history of  . Cerebral aneurysm     history of cerebral hemorrhage due to brain aneurysm rupture 25 yrs ago  . DJD (degenerative joint disease)   . Urethral stricture     history of  . Stroke    Past Surgical History  Procedure Laterality Date  . Varicose vein surgery    . Appendectomy    . Prostatectomy    . Tonsillectomy and adenoidectomy    . Above knee leg amputation      left  . Suboccipital craniectomy cervical laminectomy Left 05/13/2013    Procedure: Left SUBOCCIPITAL CRANIECTOMY;  Surgeon: Cristi Loron, MD;  Location: [facility];  Service: Neurosurgery;  Laterality: Left;   No family history on file. History  Substance Use Topics  . Smoking status: Never Smoker   . Smokeless tobacco: Not on file  . Alcohol Use: No    Review of Systems Ten point review of symptoms performed and is negative with the exception of symptoms noted above    Allergies  Review of patient's allergies indicates no known allergies.  Home Medications   Current Outpatient Rx  Name  Route  Sig  Dispense  Refill  . amiodarone (PACERONE) 200 MG tablet   Oral   Take 100 mg by mouth daily.         . Cholecalciferol (VITAMIN D PO)   Oral   Take 1 tablet by mouth  daily.         Marland Kitchen HYDROcodone-acetaminophen (NORCO/VICODIN) 5-325 MG per tablet   Oral   Take 1 tablet by mouth every 4 (four) hours as needed for moderate pain.   30 tablet   0   . Multiple Vitamin (MULTIVITAMIN PO)   Oral   Take 1 tablet by mouth daily.           BP 140/71  Pulse 77  Temp(Src) 97.3 F (36.3 C) (Oral)  Resp 16  SpO2 96% Physical Exam Gen: well developed and well nourished appearing Head: NCAT Eyes: PERL, EOMI Nose: no epistaixis or rhinorrhea Mouth/throat: mucosa is moist and pink Neck: no c spine ttp, midline posterior incision c/d/i Chest wall: no ttp Lungs: CTA B, no wheezing, rhonchi or rales CV: RRR, no murmur, extremities appear well perfused.  Abd: soft, notender, nondistended Back: no ttp, no cva ttp Skin: warm and dry Ext: no ttp over either of the arms or legs, FROM without pain, pelvis nontender, s/p left BKA, otherwise normal to inspection, no dependent edema Neuro: CN ii-xii grossly intact, no focal deficits Psyche; normal affect,  calm and cooperative.   ED Course  Procedures (including critical care time) Labs Review Labs Reviewed - No data to display Imaging Review Ct Head Wo Contrast  05/23/2013  CLINICAL DATA:  fall  EXAM: CT HEAD WITHOUT CONTRAST  TECHNIQUE: Contiguous axial images were obtained from the base of the skull through the vertex without intravenous contrast.  COMPARISON:  CT HEAD W/O CM dated 05/14/2013  FINDINGS: No acute intracranial abnormality. Specifically, no hemorrhage, hydrocephalus, mass effect, acute infarction, or significant intracranial injury. Craniotomy defect and postsurgical changes in the cerebellar region on the left. The calvarium is otherwise unremarkable. Visualized paranasal sinuses and mastoid air cells are patent.  IMPRESSION: No acute intracranial abnormality. Postsurgical changes within the posterior fossa on the left. Chronic and involutional changes appreciated.   Electronically Signed   By:  Salome HolmesHector  Cooper M.D.   On: 05/23/2013 23:09     MDM   S/p mechanical fall without significant traumatic injury. Stable for d/c to SNF with fall precautions.    Brandt LoosenJulie Shantee Hayne, MD 05/23/13 [phone]

## 2013-05-24 NOTE — ED Notes (Signed)
Pt dc to home. Pt sts understanding to dc instructions. Report given to ptar. [facility]

## 2013-05-25 ENCOUNTER — Telehealth: Payer: Self-pay | Admitting: Cardiology

## 2013-05-25 NOTE — Telephone Encounter (Signed)
New Prob   Wanted to notify Dr. [place]Jordan pt is no longer on Coumadin due to a stroke he had on 4/1.

## 2013-05-25 NOTE — Telephone Encounter (Signed)
Returned call to patient's [name] no answer.LMTC.

## 2013-05-28 ENCOUNTER — Encounter: Payer: Self-pay | Admitting: Cardiology

## 2013-05-28 ENCOUNTER — Ambulatory Visit (INDEPENDENT_AMBULATORY_CARE_PROVIDER_SITE_OTHER): Payer: Medicare Other | Admitting: Cardiology

## 2013-05-28 VITALS — BP 133/64 | HR 80 | Ht 70.0 in | Wt 186.0 lb

## 2013-05-28 DIAGNOSIS — I619 Nontraumatic intracerebral hemorrhage, unspecified: Secondary | ICD-10-CM

## 2013-05-28 DIAGNOSIS — I614 Nontraumatic intracerebral hemorrhage in cerebellum: Secondary | ICD-10-CM

## 2013-05-28 DIAGNOSIS — I4891 Unspecified atrial fibrillation: Secondary | ICD-10-CM

## 2013-05-28 NOTE — Progress Notes (Signed)
[name] BlamerJohn [name] Date of Birth: 12/25/1926 Medical Record #[id number]  History of Present Illness: [name] RuizJohn is seen today for follow up after recent hospitalization.  He was admitted 4/1-4/11/15 after he presented with acute disequilibrium. 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 His coumadin was stopped. [facility] at [facility]. He has atrial fib and has been on chronic amiodarone. Other issues include PAD, DVT, remote cerebral aneurysm, DJD, and past amputations of the left leg. He denies any chest pain or SOB. No palpitations. Balance is improving.  Current Outpatient Prescriptions on File Prior to Visit  Medication Sig Dispense Refill  . amiodarone (PACERONE) 200 MG tablet Take 100 mg by mouth daily.      Marland Kitchen. HYDROcodone-acetaminophen (NORCO/VICODIN) 5-325 MG per tablet Take 1 tablet by mouth every 4 (four) hours as needed for moderate pain.  30 tablet  0  . Multiple Vitamin (MULTIVITAMIN PO) Take 1 tablet by mouth daily.        No current facility-administered medications on file prior to visit.    No Known Allergies  Past Medical History  Diagnosis Date  . A-fib   . Peripheral arterial disease   . DVT (deep venous thrombosis)     history of  . Cerebral aneurysm     history of cerebral hemorrhage due to brain aneurysm rupture 25 yrs ago  . DJD (degenerative joint disease)   . Urethral stricture     history of  . Stroke     Past Surgical History  Procedure Laterality Date  . Varicose vein surgery    . Appendectomy    . Prostatectomy    . Tonsillectomy and adenoidectomy    . Above knee leg amputation      left  . Suboccipital craniectomy cervical laminectomy Left 05/13/2013    Procedure: Left SUBOCCIPITAL CRANIECTOMY;  Surgeon: [name], MD;  Location: [facility];  Service: Neurosurgery;  Laterality: Left;    History  Smoking status  . Never Smoker   Smokeless tobacco  . Not on file    History  Alcohol Use No    History  reviewed. No pertinent family history.  Review of Systems: The review of systems is per the HPI.  All other systems were reviewed and are negative.  Physical Exam: BP 133/64  Pulse 80  Ht 5\' 10"  (1.778 m)  Wt 186 lb (84.369 kg)  BMI 26.69 kg/m2 He is not able to weigh. He is seen in a wheelchair.  Patient is very pleasant and in no acute distress. Skin is warm and dry. Color is normal.  HEENT is unremarkable. Normocephalic/atraumatic. PERRL. Sclera are nonicteric.  No JVD. Lungs are clear. Cardiac exam shows a regular rhythm. Abdomen is soft. Extremities are without edema. Gait is not tested. Left AKA. ROM appears intact. He is alert and oriented x 3.   LABORATORY DATA:    Lab Results  Component Value Date   WBC 8.5 05/19/2013   HGB 12.1* 05/19/2013   HCT 35.9* 05/19/2013   PLT 138* 05/19/2013   GLUCOSE 113* 05/19/2013   ALT 25 12/03/2012   AST 23 12/03/2012   NA 142 05/19/2013   K 3.8 05/19/2013   CL 104 05/19/2013   CREATININE 0.70 05/19/2013   BUN 8 05/19/2013   CO2 25 05/19/2013   TSH 1.76 12/03/2012   INR 1.29 05/16/2013   HGBA1C  Value: 5.9 (NOTE) The ADA recommends the following therapeutic goal for glycemic control related to Hgb A1c  measurement: Goal of therapy: <6.5 Hgb A1c  Reference: American Diabetes Association: Clinical Practice Recommendations 2010, Diabetes Care, 2010, 33: (Suppl  1). 09/29/2008   Lab Results  Component Value Date   INR 1.29 05/16/2013   INR 1.28 05/15/2013   INR 1.21 05/14/2013   ECG 05/13/13 showed NSR with normal Ecg.  Assessment / Plan:  1. PAF -  maintaining sinus rhythm on amiodarone.   2. 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 S/p surgical evacuation. Not a candidate for anticoagulation anymore.  3. PAD - past leg amputation.    I will follow up in 6 months.

## 2013-05-28 NOTE — Patient Instructions (Signed)
Continue your current therapy  I will see you in 6 months.   

## 2013-06-19 ENCOUNTER — Encounter (HOSPITAL_COMMUNITY): Payer: Self-pay | Admitting: Pharmacy Technician

## 2013-06-19 ENCOUNTER — Other Ambulatory Visit: Payer: Self-pay | Admitting: Neurosurgery

## 2013-06-19 ENCOUNTER — Encounter (HOSPITAL_COMMUNITY): Payer: Self-pay | Admitting: *Deleted

## 2013-06-21 MED ORDER — CEFAZOLIN SODIUM-DEXTROSE 2-3 GM-% IV SOLR
2.0000 g | INTRAVENOUS | Status: AC
  Administered 2013-06-22: 2 g via INTRAVENOUS
  Filled 2013-06-21: qty 50

## 2013-06-22 ENCOUNTER — Encounter (HOSPITAL_COMMUNITY): Payer: Medicare Other | Admitting: Anesthesiology

## 2013-06-22 ENCOUNTER — Ambulatory Visit (HOSPITAL_COMMUNITY): Payer: Medicare Other | Admitting: Anesthesiology

## 2013-06-22 ENCOUNTER — Encounter (HOSPITAL_COMMUNITY): Payer: Self-pay | Admitting: *Deleted

## 2013-06-22 ENCOUNTER — Encounter (HOSPITAL_COMMUNITY)
Admission: RE | Disposition: A | Discharge status: Home Health | Payer: Self-pay | Source: Ambulatory Visit | Attending: Neurosurgery

## 2013-06-22 ENCOUNTER — Inpatient Hospital Stay (HOSPITAL_COMMUNITY)
Admission: RE | Admit: 2013-06-22 | Discharge: 2013-06-24 | DRG: 863 | Disposition: A | Discharge status: Home Health | Payer: Medicare Other | Source: Ambulatory Visit | Attending: Neurosurgery | Admitting: Neurosurgery

## 2013-06-22 DIAGNOSIS — L089 Local infection of the skin and subcutaneous tissue, unspecified: Secondary | ICD-10-CM | POA: Diagnosis present

## 2013-06-22 DIAGNOSIS — Z79899 Other long term (current) drug therapy: Secondary | ICD-10-CM

## 2013-06-22 DIAGNOSIS — Y838 Other surgical procedures as the cause of abnormal reaction of the patient, or of later complication, without mention of misadventure at the time of the procedure: Secondary | ICD-10-CM | POA: Diagnosis present

## 2013-06-22 DIAGNOSIS — I739 Peripheral vascular disease, unspecified: Secondary | ICD-10-CM | POA: Diagnosis present

## 2013-06-22 DIAGNOSIS — T148XXA Other injury of unspecified body region, initial encounter: Secondary | ICD-10-CM

## 2013-06-22 DIAGNOSIS — T8140XA Infection following a procedure, unspecified, initial encounter: Principal | ICD-10-CM | POA: Diagnosis present

## 2013-06-22 DIAGNOSIS — I4891 Unspecified atrial fibrillation: Secondary | ICD-10-CM | POA: Diagnosis present

## 2013-06-22 DIAGNOSIS — H9190 Unspecified hearing loss, unspecified ear: Secondary | ICD-10-CM | POA: Diagnosis present

## 2013-06-22 DIAGNOSIS — S78119A Complete traumatic amputation at level between unspecified hip and knee, initial encounter: Secondary | ICD-10-CM

## 2013-06-22 DIAGNOSIS — Z8673 Personal history of transient ischemic attack (TIA), and cerebral infarction without residual deficits: Secondary | ICD-10-CM

## 2013-06-22 DIAGNOSIS — Z7901 Long term (current) use of anticoagulants: Secondary | ICD-10-CM

## 2013-06-22 DIAGNOSIS — Z86718 Personal history of other venous thrombosis and embolism: Secondary | ICD-10-CM

## 2013-06-22 HISTORY — DX: Other complications of anesthesia, initial encounter: T88.59XA

## 2013-06-22 HISTORY — DX: Unspecified hearing loss, unspecified ear: H91.90

## 2013-06-22 HISTORY — DX: Adverse effect of unspecified anesthetic, initial encounter: T41.45XA

## 2013-06-22 HISTORY — DX: Cardiac arrhythmia, unspecified: I49.9

## 2013-06-22 HISTORY — PX: LUMBAR WOUND DEBRIDEMENT: SHX1988

## 2013-06-22 HISTORY — DX: Personal history of urinary calculi: Z87.442

## 2013-06-22 LAB — CBC
HEMATOCRIT: 40.1 % (ref 39.0–52.0)
HEMOGLOBIN: 12.8 g/dL — AB (ref 13.0–17.0)
MCH: 27.4 pg (ref 26.0–34.0)
MCHC: 31.9 g/dL (ref 30.0–36.0)
MCV: 85.9 fL (ref 78.0–100.0)
PLATELETS: 200 10*3/uL (ref 150–400)
RBC: 4.67 MIL/uL (ref 4.22–5.81)
RDW: 14.4 % (ref 11.5–15.5)
WBC: 8.1 10*3/uL (ref 4.0–10.5)

## 2013-06-22 LAB — BASIC METABOLIC PANEL
BUN: 16 mg/dL (ref 6–23)
CHLORIDE: 105 meq/L (ref 96–112)
CO2: 25 meq/L (ref 19–32)
Calcium: 8.7 mg/dL (ref 8.4–10.5)
Creatinine, Ser: 0.88 mg/dL (ref 0.50–1.35)
GFR calc Af Amer: 88 mL/min — ABNORMAL LOW (ref >90)
GFR calc non Af Amer: 76 mL/min — ABNORMAL LOW (ref >90)
Glucose, Bld: 107 mg/dL — ABNORMAL HIGH (ref 70–99)
Potassium: 4.4 mEq/L (ref 3.7–5.3)
SODIUM: 142 meq/L (ref 137–147)

## 2013-06-22 SURGERY — LUMBAR WOUND DEBRIDEMENT
Anesthesia: General

## 2013-06-22 MED ORDER — PROPOFOL 10 MG/ML IV BOLUS
INTRAVENOUS | Status: DC | PRN
  Administered 2013-06-22: 150 mg via INTRAVENOUS

## 2013-06-22 MED ORDER — LIDOCAINE HCL (CARDIAC) 20 MG/ML IV SOLN
INTRAVENOUS | Status: DC | PRN
Start: 2013-06-22 — End: 2013-06-22
  Administered 2013-06-22: 50 mg via INTRAVENOUS

## 2013-06-22 MED ORDER — FENTANYL CITRATE 0.05 MG/ML IJ SOLN
INTRAMUSCULAR | Status: DC | PRN
  Administered 2013-06-22: 10 ug via INTRAVENOUS

## 2013-06-22 MED ORDER — GLYCOPYRROLATE 0.2 MG/ML IJ SOLN
INTRAMUSCULAR | Status: AC
  Filled 2013-06-22: qty 3

## 2013-06-22 MED ORDER — LIDOCAINE HCL (CARDIAC) 20 MG/ML IV SOLN
INTRAVENOUS | Status: AC
  Filled 2013-06-22: qty 5

## 2013-06-22 MED ORDER — ONDANSETRON HCL 4 MG/2ML IJ SOLN
INTRAMUSCULAR | Status: DC | PRN
  Administered 2013-06-22: 4 mg via INTRAVENOUS

## 2013-06-22 MED ORDER — LACTATED RINGERS IV SOLN
INTRAVENOUS | Status: DC
  Administered 2013-06-22: 19:00:00 via INTRAVENOUS

## 2013-06-22 MED ORDER — ACETAMINOPHEN 650 MG RE SUPP: 650.0000 mg | RECTAL | Status: DC | PRN

## 2013-06-22 MED ORDER — AMIODARONE HCL 100 MG PO TABS
100.0000 mg | ORAL_TABLET | Freq: Every day | ORAL | Status: DC
  Administered 2013-06-23 – 2013-06-24 (×2): 100 mg via ORAL
  Filled 2013-06-22 (×2): qty 1

## 2013-06-22 MED ORDER — PHENOL 1.4 % MT LIQD
1.0000 | OROMUCOSAL | Status: DC | PRN
  Administered 2013-06-22: 1 via OROMUCOSAL
  Filled 2013-06-22: qty 177

## 2013-06-22 MED ORDER — GLYCOPYRROLATE 0.2 MG/ML IJ SOLN
INTRAMUSCULAR | Status: DC | PRN
  Administered 2013-06-22: .7 mg via INTRAVENOUS

## 2013-06-22 MED ORDER — LACTATED RINGERS IV SOLN
INTRAVENOUS | Status: DC | PRN
  Administered 2013-06-22: 16:00:00 via INTRAVENOUS

## 2013-06-22 MED ORDER — THROMBIN 5000 UNITS EX SOLR
CUTANEOUS | Status: DC | PRN
  Administered 2013-06-22 (×2): 5000 [IU] via TOPICAL

## 2013-06-22 MED ORDER — DOCUSATE SODIUM 100 MG PO CAPS
100.0000 mg | ORAL_CAPSULE | Freq: Two times a day (BID) | ORAL | Status: DC
  Administered 2013-06-23 – 2013-06-24 (×3): 100 mg via ORAL
  Filled 2013-06-22 (×2): qty 1

## 2013-06-22 MED ORDER — HYDROCODONE-ACETAMINOPHEN 5-325 MG PO TABS: 1.0000 | ORAL_TABLET | ORAL | Status: DC | PRN

## 2013-06-22 MED ORDER — PHENYLEPHRINE HCL 10 MG/ML IJ SOLN
10.0000 mg | INTRAVENOUS | Status: DC | PRN
  Administered 2013-06-22: 15 ug/min via INTRAVENOUS

## 2013-06-22 MED ORDER — PHENYLEPHRINE 40 MCG/ML (10ML) SYRINGE FOR IV PUSH (FOR BLOOD PRESSURE SUPPORT)
PREFILLED_SYRINGE | INTRAVENOUS | Status: AC
  Filled 2013-06-22: qty 10

## 2013-06-22 MED ORDER — MENTHOL 3 MG MT LOZG: 1.0000 | LOZENGE | OROMUCOSAL | Status: DC | PRN

## 2013-06-22 MED ORDER — 0.9 % SODIUM CHLORIDE (POUR BTL) OPTIME
TOPICAL | Status: DC | PRN
  Administered 2013-06-22: 1000 mL

## 2013-06-22 MED ORDER — PHENYLEPHRINE HCL 10 MG/ML IJ SOLN
INTRAMUSCULAR | Status: AC
  Filled 2013-06-22: qty 1

## 2013-06-22 MED ORDER — LACTATED RINGERS IV SOLN
INTRAVENOUS | Status: DC
  Administered 2013-06-22: 13:00:00 via INTRAVENOUS

## 2013-06-22 MED ORDER — DIAZEPAM 5 MG PO TABS: 5.0000 mg | ORAL_TABLET | Freq: Four times a day (QID) | ORAL | Status: DC | PRN

## 2013-06-22 MED ORDER — NEOSTIGMINE METHYLSULFATE 10 MG/10ML IV SOLN
INTRAVENOUS | Status: DC | PRN
  Administered 2013-06-22: 4 mg via INTRAVENOUS

## 2013-06-22 MED ORDER — ONDANSETRON HCL 4 MG/2ML IJ SOLN: 4.0000 mg | INTRAMUSCULAR | Status: DC | PRN

## 2013-06-22 MED ORDER — ROCURONIUM BROMIDE 50 MG/5ML IV SOLN
INTRAVENOUS | Status: AC
  Filled 2013-06-22: qty 1

## 2013-06-22 MED ORDER — DEXTROSE 5 % IV SOLN
1.0000 g | INTRAVENOUS | Status: DC
  Administered 2013-06-22 – 2013-06-23 (×2): 1 g via INTRAVENOUS
  Filled 2013-06-22 (×3): qty 10

## 2013-06-22 MED ORDER — ALUM & MAG HYDROXIDE-SIMETH 200-200-20 MG/5ML PO SUSP: 30.0000 mL | Freq: Four times a day (QID) | ORAL | Status: DC | PRN

## 2013-06-22 MED ORDER — HEMOSTATIC AGENTS (NO CHARGE) OPTIME
TOPICAL | Status: DC | PRN
  Administered 2013-06-22: 1 via TOPICAL

## 2013-06-22 MED ORDER — ROCURONIUM BROMIDE 100 MG/10ML IV SOLN
INTRAVENOUS | Status: DC | PRN
  Administered 2013-06-22: 30 mg via INTRAVENOUS

## 2013-06-22 MED ORDER — FENTANYL CITRATE 0.05 MG/ML IJ SOLN
INTRAMUSCULAR | Status: AC
  Filled 2013-06-22: qty 5

## 2013-06-22 MED ORDER — ACETAMINOPHEN 325 MG PO TABS: 650.0000 mg | ORAL_TABLET | ORAL | Status: DC | PRN

## 2013-06-22 MED ORDER — OXYCODONE-ACETAMINOPHEN 5-325 MG PO TABS
1.0000 | ORAL_TABLET | ORAL | Status: DC | PRN
  Administered 2013-06-22 – 2013-06-24 (×2): 2 via ORAL
  Filled 2013-06-22 (×2): qty 2

## 2013-06-22 MED ORDER — SODIUM CHLORIDE 0.9 % IR SOLN
Status: DC | PRN
  Administered 2013-06-22: 17:00:00

## 2013-06-22 MED ORDER — NEOSTIGMINE METHYLSULFATE 10 MG/10ML IV SOLN
INTRAVENOUS | Status: AC
  Filled 2013-06-22: qty 1

## 2013-06-22 MED ORDER — FENTANYL CITRATE 0.05 MG/ML IJ SOLN: 25.0000 ug | INTRAMUSCULAR | Status: DC | PRN

## 2013-06-22 MED ORDER — PROPOFOL 10 MG/ML IV BOLUS
INTRAVENOUS | Status: AC
  Filled 2013-06-22: qty 20

## 2013-06-22 MED ORDER — BACITRACIN ZINC 500 UNIT/GM EX OINT
TOPICAL_OINTMENT | CUTANEOUS | Status: DC | PRN
  Administered 2013-06-22: 1 via TOPICAL

## 2013-06-22 MED ORDER — VANCOMYCIN HCL IN DEXTROSE 1-5 GM/200ML-% IV SOLN
1000.0000 mg | Freq: Two times a day (BID) | INTRAVENOUS | Status: DC
  Administered 2013-06-22 – 2013-06-24 (×4): 1000 mg via INTRAVENOUS
  Filled 2013-06-22 (×5): qty 200

## 2013-06-22 MED ORDER — MORPHINE SULFATE 2 MG/ML IJ SOLN: 1.0000 mg | INTRAMUSCULAR | Status: DC | PRN

## 2013-06-22 MED ORDER — ADULT MULTIVITAMIN W/MINERALS CH
1.0000 | ORAL_TABLET | Freq: Every day | ORAL | Status: DC
  Administered 2013-06-23 – 2013-06-24 (×2): 1 via ORAL
  Filled 2013-06-22 (×3): qty 1

## 2013-06-22 SURGICAL SUPPLY — 49 items
APL SKNCLS STERI-STRIP NONHPOA (GAUZE/BANDAGES/DRESSINGS) ×1
BAG DECANTER FOR FLEXI CONT (MISCELLANEOUS) ×3 IMPLANT
BENZOIN TINCTURE PRP APPL 2/3 (GAUZE/BANDAGES/DRESSINGS) ×3 IMPLANT
BLADE 10 SAFETY STRL DISP (BLADE) ×3 IMPLANT
BLADE SURG ROTATE 9660 (MISCELLANEOUS) IMPLANT
CANISTER SUCT 3000ML (MISCELLANEOUS) ×3 IMPLANT
CLOSURE WOUND 1/2 X4 (GAUZE/BANDAGES/DRESSINGS) ×1
CONT SPEC 4OZ CLIKSEAL STRL BL (MISCELLANEOUS) IMPLANT
DRAPE LAPAROTOMY 100X72X124 (DRAPES) ×3 IMPLANT
DRAPE POUCH INSTRU U-SHP 10X18 (DRAPES) ×3 IMPLANT
DRAPE SURG 17X23 STRL (DRAPES) ×12 IMPLANT
ELECT REM PT RETURN 9FT ADLT (ELECTROSURGICAL) ×3
ELECTRODE REM PT RTRN 9FT ADLT (ELECTROSURGICAL) ×1 IMPLANT
GAUZE SPONGE 4X4 16PLY XRAY LF (GAUZE/BANDAGES/DRESSINGS) IMPLANT
GLOVE BIO SURGEON STRL SZ8 (GLOVE) ×2 IMPLANT
GLOVE BIO SURGEON STRL SZ8.5 (GLOVE) ×3 IMPLANT
GLOVE BIOGEL PI IND STRL 7.0 (GLOVE) IMPLANT
GLOVE BIOGEL PI INDICATOR 7.0 (GLOVE) ×2
GLOVE EXAM NITRILE LRG STRL (GLOVE) IMPLANT
GLOVE EXAM NITRILE MD LF STRL (GLOVE) IMPLANT
GLOVE EXAM NITRILE XL STR (GLOVE) IMPLANT
GLOVE EXAM NITRILE XS STR PU (GLOVE) IMPLANT
GLOVE SS BIOGEL STRL SZ 6.5 (GLOVE) IMPLANT
GLOVE SS BIOGEL STRL SZ 8 (GLOVE) ×1 IMPLANT
GLOVE SUPERSENSE BIOGEL SZ 6.5 (GLOVE) ×2
GLOVE SUPERSENSE BIOGEL SZ 8 (GLOVE) ×2
GOWN BRE IMP SLV AUR LG STRL (GOWN DISPOSABLE) IMPLANT
GOWN BRE IMP SLV AUR XL STRL (GOWN DISPOSABLE) IMPLANT
GOWN STRL REUS W/ TWL LRG LVL3 (GOWN DISPOSABLE) IMPLANT
GOWN STRL REUS W/ TWL XL LVL3 (GOWN DISPOSABLE) IMPLANT
GOWN STRL REUS W/TWL LRG LVL3 (GOWN DISPOSABLE) ×3
GOWN STRL REUS W/TWL XL LVL3 (GOWN DISPOSABLE) ×3
KIT BASIN OR (CUSTOM PROCEDURE TRAY) ×3 IMPLANT
KIT ROOM TURNOVER OR (KITS) ×3 IMPLANT
NEEDLE HYPO 22GX1.5 SAFETY (NEEDLE) IMPLANT
NS IRRIG 1000ML POUR BTL (IV SOLUTION) ×3 IMPLANT
PACK LAMINECTOMY NEURO (CUSTOM PROCEDURE TRAY) ×3 IMPLANT
PAD ARMBOARD 7.5X6 YLW CONV (MISCELLANEOUS) ×9 IMPLANT
SPONGE GAUZE 4X4 12PLY (GAUZE/BANDAGES/DRESSINGS) ×3 IMPLANT
STRIP CLOSURE SKIN 1/2X4 (GAUZE/BANDAGES/DRESSINGS) ×2 IMPLANT
SUT VIC AB 1 CT1 18XBRD ANBCTR (SUTURE) ×1 IMPLANT
SUT VIC AB 1 CT1 8-18 (SUTURE) ×3
SUT VIC AB 2-0 CP2 18 (SUTURE) ×3 IMPLANT
SWAB CULTURE LIQ STUART DBL (MISCELLANEOUS) IMPLANT
SYR 20ML ECCENTRIC (SYRINGE) ×3 IMPLANT
TOWEL OR 17X24 6PK STRL BLUE (TOWEL DISPOSABLE) ×3 IMPLANT
TOWEL OR 17X26 10 PK STRL BLUE (TOWEL DISPOSABLE) ×3 IMPLANT
TUBE ANAEROBIC SPECIMEN COL (MISCELLANEOUS) IMPLANT
WATER STERILE IRR 1000ML POUR (IV SOLUTION) ×3 IMPLANT

## 2013-06-22 NOTE — Anesthesia Procedure Notes (Signed)
Procedure Name: Intubation Date/Time: 06/22/2013 4:09 PM Performed by: Gwenyth AllegraADAMI, Carlosdaniel Grob Pre-anesthesia Checklist: Timeout performed, Patient identified, Emergency Drugs available, Suction available and Patient being monitored Patient Re-evaluated:Patient Re-evaluated prior to inductionOxygen Delivery Method: Circle system utilized Preoxygenation: Pre-oxygenation with 100% oxygen Intubation Type: IV induction Ventilation: Mask ventilation without difficulty Laryngoscope Size: Mac and 4 Grade View: Grade I Tube type: Oral Number of attempts: 1 Airway Equipment and Method: Stylet Placement Confirmation: ETT inserted through vocal cords under direct vision,  breath sounds checked- equal and bilateral and positive ETCO2 Secured at: 21 cm Tube secured with: Tape Dental Injury: Teeth and Oropharynx as per pre-operative assessment

## 2013-06-22 NOTE — Progress Notes (Signed)
Subjective:  The patient is alert and pleasant. He is in no apparent distress.  Objective: Vital signs in last 24 hours: Temp:  [97.3 F (36.3 C)-98 F (36.7 C)] 98 F (36.7 C) (05/11 1714) Pulse Rate:  [75-81] 81 (05/11 1714) Resp:  [20-24] 24 (05/11 1714) BP: (108-131)/(43-57) 131/57 mmHg (05/11 1714) SpO2:  [99 %-100 %] 99 % (05/11 1714) Weight:  [83.462 kg (184 lb)] 83.462 kg (184 lb) (05/11 1304)  Intake/Output from previous day:   Intake/Output this shift:    Physical exam the patient is moving his extremities well.  Lab Results:  Recent Labs  06/22/13 1227  WBC 8.1  HGB 12.8*  HCT 40.1  PLT 200   BMET  Recent Labs  06/22/13 1227  NA 142  K 4.4  CL 105  CO2 25  GLUCOSE 107*  BUN 16  CREATININE 0.88  CALCIUM 8.7    Studies/Results: No results found.  Assessment/Plan: The patient is doing well. I spoke with his daughter.  LOS: 0 days     Cristi LoronJeffrey D Kathleen Likins 06/22/2013, 5:18 PM

## 2013-06-22 NOTE — Transfer of Care (Signed)
Immediate Anesthesia Transfer of Care Note  Patient: [name] [name]  Procedure(s) Performed: Procedure(s): incision and drainage of posterior cervical wound (N/A)  Patient Location: PACU  Anesthesia Type:General  Level of Consciousness: awake, alert  and oriented  Airway & Oxygen Therapy: Patient Spontanous Breathing and Patient connected to nasal cannula oxygen  Post-op Assessment: Report given to PACU RN and Post -op Vital signs reviewed and stable  Post vital signs: Reviewed and stable  Complications: No apparent anesthesia complications

## 2013-06-22 NOTE — H&P (Signed)
Subjective: The patient is an 80 year old white male on whom I performed a suboccipital craniectomy for a cerebellar hemorrhage on 05/13/2013. The patient made good neurologic recovery but has developed drainage from his wound. I recommended incision and drainage. The patient has decided proceed with surgery.   Past Medical History  Diagnosis Date  . A-fib   . Peripheral arterial disease   . DVT (deep venous thrombosis)     history of prior to amputation  . Cerebral aneurysm     history of cerebral hemorrhage due to brain aneurysm rupture 25 yrs ago  . DJD (degenerative joint disease)   . Urethral stricture     history of dilated  . Complication of anesthesia     had Atrial Fib.- per patient  . Stroke     patient unaware  . Dysrhythmia     PAF  . History of kidney stones     passed all except 1.  . HOH (hard of hearing)     Past Surgical History  Procedure Laterality Date  . Varicose vein surgery    . Appendectomy    . Prostatectomy    . Tonsillectomy and adenoidectomy    . Above knee leg amputation      left  . Suboccipital craniectomy cervical laminectomy Left 05/13/2013    Procedure: Left SUBOCCIPITAL CRANIECTOMY;  Surgeon: Cristi LoronJeffrey D Nakeyia Menden, MD;  Location: [facility];  Service: Neurosurgery;  Laterality: Left;  . Cystoscopy      kidney stone  . Hernia repair Left   . Cyst excision      above rectum    Allergies  Allergen Reactions  . Coumadin [Warfarin Sodium] Other (See Comments)    Caused bleeding in brain    History  Substance Use Topics  . Smoking status: Never Smoker   . Smokeless tobacco: Never Used  . Alcohol Use: No    History reviewed. No pertinent family history. Prior to Admission medications   Medication Sig Start Date End Date Taking? Authorizing Provider  amiodarone (PACERONE) 200 MG tablet Take 100 mg by mouth daily.   Yes Historical Provider, MD  cephALEXin (KEFLEX) 500 MG capsule Take 500 mg by mouth every 6 (six) hours. 14 day course  started 06/19/13   Yes Historical Provider, MD  doxycycline (VIBRAMYCIN) 100 MG capsule Take 100 mg by mouth 2 (two) times daily.   Yes Historical Provider, MD  Emollient (EUCERIN) lotion Apply 1 mL topically as needed for dry skin.   Yes Historical Provider, MD  Multiple Vitamin (MULTIVITAMIN WITH MINERALS) TABS tablet Take 1 tablet by mouth daily.   Yes Historical Provider, MD  PRESCRIPTION MEDICATION Apply 1 application topically daily as needed (dry skin). Medicated cream   Yes Historical Provider, MD     Review of Systems  Positive ROS: As above  All other systems have been reviewed and were otherwise negative with the exception of those mentioned in the HPI and as above.  Objective: Vital signs in last 24 hours: Temp:  [97.3 F (36.3 C)] 97.3 F (36.3 C) (05/11 1313) Pulse Rate:  [75] 75 (05/11 1304) Resp:  [20] 20 (05/11 1304) BP: (108)/(43) 108/43 mmHg (05/11 1304) SpO2:  [100 %] 100 % (05/11 1304) Weight:  [83.462 kg (184 lb)] 83.462 kg (184 lb) (05/11 1304)  General Appearance: Alert, cooperative, no distress, Head: Normocephalic, without obvious abnormality, atraumatic Eyes: PERRL, conjunctiva/corneas clear, EOM's intact,    Ears: Normal  Throat: Normal  Neck: Supple, symmetrical, trachea midline, no adenopathy;  thyroid: No enlargement/tenderness/nodules; no carotid bruit or JVD. The patient has some purulent drainage from his suboccipital and cervical incision. Back: Symmetric, no curvature, ROM normal, no CVA tenderness Lungs: Clear to auscultation bilaterally, respirations unlabored Heart: Regular rate and rhythm, no murmur, rub or gallop Abdomen: Soft, non-tender,, no masses, no organomegaly Extremities: Extremities normal, atraumatic, no cyanosis or edema the patient has a lower extremity amputation. Pulses: 2+ and symmetric all extremities Skin: Skin color, texture, turgor normal, no rashes or lesions  NEUROLOGIC:   Mental status: alert and oriented, no aphasia,  good attention span, Fund of knowledge/ memory ok Motor Exam - grossly normal Sensory Exam - grossly normal Reflexes:  Coordination - grossly normal Gait - grossly normal Balance - grossly normal Cranial Nerves: I: smell Not tested  II: visual acuity  OS: Normal  OD: Normal   II: visual fields Full to confrontation  II: pupils Equal, round, reactive to light  III,VII: ptosis None  III,IV,VI: extraocular muscles  Full ROM  V: mastication Normal  V: facial light touch sensation  Normal  V,VII: corneal reflex  Present  VII: facial muscle function - upper  Normal  VII: facial muscle function - lower Normal  VIII: hearing Not tested  IX: soft palate elevation  Normal  IX,X: gag reflex Present  XI: trapezius strength  5/5  XI: sternocleidomastoid strength 5/5  XI: neck flexion strength  5/5  XII: tongue strength  Normal    Data Review Lab Results  Component Value Date   WBC 8.1 06/22/2013   HGB 12.8* 06/22/2013   HCT 40.1 06/22/2013   MCV 85.9 06/22/2013   PLT 200 06/22/2013   Lab Results  Component Value Date   NA 142 06/22/2013   K 4.4 06/22/2013   CL 105 06/22/2013   CO2 25 06/22/2013   BUN 16 06/22/2013   CREATININE 0.88 06/22/2013   GLUCOSE 107* 06/22/2013   Lab Results  Component Value Date   INR 1.29 05/16/2013    Assessment/Plan: Wound infection: I discussed situation with the patient and his daughter. We have discussed the various treatment options. I recommended an incision and drainage of the wound. I described the surgery to them. We have discussed the risks, benefits, alternatives, and likelihood of achieving our goals with surgery. I've answered all the patient's questions. He would like to proceed with the operation.   Cristi LoronJeffrey D Alaiyah Bollman 06/22/2013 3:19 PM

## 2013-06-22 NOTE — Anesthesia Postprocedure Evaluation (Signed)
  Anesthesia Post-op Note  Patient: [name] [name]  Procedure(s) Performed: Procedure(s): incision and drainage of posterior cervical wound (N/A)  Patient Location: PACU  Anesthesia Type:General  Level of Consciousness: awake, alert , oriented and patient cooperative  Airway and Oxygen Therapy: Patient Spontanous Breathing and Patient connected to nasal cannula oxygen  Post-op Pain: mild  Post-op Assessment: Post-op Vital signs reviewed, Patient's Cardiovascular Status Stable, Respiratory Function Stable, Patent Airway, No signs of Nausea or vomiting and Pain level controlled  Post-op Vital Signs: Reviewed and stable  Last Vitals:  Filed Vitals:   06/22/13 1741  BP: 114/70  Pulse: 71  Temp:   Resp: 19    Complications: No apparent anesthesia complications

## 2013-06-22 NOTE — Anesthesia Preprocedure Evaluation (Addendum)
Anesthesia Evaluation  Patient identified by MRN, date of birth, ID band Patient awake    Reviewed: Allergy & Precautions, H&P , NPO status   Airway Mallampati: II      Dental   Pulmonary neg pulmonary ROS,  breath sounds clear to auscultation        Cardiovascular + Peripheral Vascular Disease Rhythm:Regular Rate:Normal     Neuro/Psych    GI/Hepatic negative GI ROS, Neg liver ROS,   Endo/Other    Renal/GU negative Renal ROS     Musculoskeletal   Abdominal   Peds  Hematology   Anesthesia Other Findings   Reproductive/Obstetrics                          Anesthesia Physical Anesthesia Plan  ASA: III  Anesthesia Plan: General   Post-op Pain Management:    Induction: Intravenous  Airway Management Planned: Oral ETT  Additional Equipment:   Intra-op Plan:   Post-operative Plan: Extubation in OR  Informed Consent: I have reviewed the patients History and Physical, chart, labs and discussed the procedure including the risks, benefits and alternatives for the proposed anesthesia with the patient or authorized representative who has indicated his/her understanding and acceptance.   Dental advisory given  Plan Discussed with: CRNA and Anesthesiologist  Anesthesia Plan Comments:         Anesthesia Quick Evaluation

## 2013-06-22 NOTE — Op Note (Signed)
Brief history: The patient is an 80 year old white male on whom I performed a suboccipital craniectomy for evacuation of cerebellar hemorrhage on 05/13/2013. The patient has made a good neurologic recovery but has had some drainage from his wound. He was treated with dressing changes and by mouth antibiotics. The drainage continued and I recommended an incision and drainage of his wound. The patient has weighed the risks, benefits, and alternatives surgery and decided proceed with the operation.  Preoperative diagnosis: Wound infection  Postop diagnosis: The same  Procedure: Incision and drainage of cervical wound  Surgeon: Dr. Delma OfficerJeff Kailin Leu  Asst.: None  Anesthesia: Gen. endotracheal  Estimated blood loss: Minimal  Specimens: Cultures  Drains: None  Complications: None  Description of procedure: The patient was brought to the operating room by the anesthesia team. General endotracheal anesthesia was induced. I then applied the [place] 3 point headrest to the patient's calvarium. He was then turned to the prone position on the chest rolls. The patient's suboccipital region was then shaved with clippers and this region as well as the cervical and upper thoracic region was then prepared with Betadine scrub and Betadine solution. Sterile drapes were applied. I then used a 15 blade scalpel to incise the small fistula at the caudal aspect of the patient's cervical wound. I inserted the Weitlanter retractor for exposure We obtain some wound cultures. We encountered some firm "cheesy" material. We removed this with suction and irrigation. The infection did not appear to track deeper. The cervical thoracic fascia was intact. We irrigated the wound out with bacitracin solution. I obtained hemostasis with bipolar electrocautery. I removed the retractor. I then reapproximated patient's subcutaneous tissue with interrupted 2-0 Vicryl suture. I then reapproximated skin with a running 3-0 nylon suture. The  was then coated with bacitracin ointment. A sterile dressing was applied. The drapes were removed. The patient was subsequently returned to the supine position. I then removed the [place] 3 point headrest from his calvarium. He was then extubated by the anesthesia team. By report all sponge, instrument, and needle counts were correct at the end this case.

## 2013-06-22 NOTE — Progress Notes (Signed)
ANTIBIOTIC CONSULT NOTE - INITIAL  Pharmacy Consult for Rocephin and Vancomycin Indication: wound infection  Allergies  Allergen Reactions  . Coumadin [Warfarin Sodium] Other (See Comments)    Caused bleeding in brain    Patient Measurements: Height: 5\' 10"  (177.8 cm) Weight: 184 lb (83.462 kg) IBW/kg (Calculated) : 73 Adjusted Body Weight:   Vital Signs: Temp: 97.5 F (36.4 C) (05/11 1804) Temp src: Oral (05/11 1804) BP: 126/54 mmHg (05/11 1804) Pulse Rate: 68 (05/11 1804) Intake/Output from previous day:   Intake/Output from this shift:    Labs:  Recent Labs  06/22/13 1227  WBC 8.1  HGB 12.8*  PLT 200  CREATININE 0.88   Estimated Creatinine Clearance: 63.4 ml/min (by C-G formula based on Cr of 0.88). No results found for this basename: VANCOTROUGH, VANCOPEAK, VANCORANDOM, GENTTROUGH, GENTPEAK, GENTRANDOM, TOBRATROUGH, TOBRAPEAK, TOBRARND, AMIKACINPEAK, AMIKACINTROU, AMIKACIN,  in the last 72 hours   Microbiology: No results found for this or any previous visit (from the past 720 hour(s)).  Medical History: Past Medical History  Diagnosis Date  . A-fib   . Peripheral arterial disease   . DVT (deep venous thrombosis)     history of prior to amputation  . Cerebral aneurysm     history of cerebral hemorrhage due to brain aneurysm rupture 25 yrs ago  . DJD (degenerative joint disease)   . Urethral stricture     history of dilated  . Complication of anesthesia     had Atrial Fib.- per patient  . Stroke     patient unaware  . Dysrhythmia     PAF  . History of kidney stones     passed all except 1.  . HOH (hard of hearing)     Medications:  Scheduled:  . [START ON 06/23/2013] amiodarone  100 mg Oral Daily  . cefTRIAXone (ROCEPHIN)  IV  1 g Intravenous Q24H  . docusate sodium  100 mg Oral BID  . multivitamin with minerals  1 tablet Oral Daily  . vancomycin  1,000 mg Intravenous Q12H   Assessment: 80 yr old male who had a suboccipital craiectomy for  a cerebellar hemorrhage on 4/1. He had developed drainage form the wound and was admitted for incision and drainage.  Goal of Therapy:  Vanc trought 15-20  Plan: 1) Vancomycin 1 Gm IV q12h                Levels when appropriate.             2) Rocephin 1 Gm IV q24h.    Lourdes SledgeBarbara Sue Nilza Eaker 06/22/2013,7:03 PM

## 2013-06-23 ENCOUNTER — Observation Stay (HOSPITAL_COMMUNITY): Payer: Medicare Other

## 2013-06-23 ENCOUNTER — Encounter (HOSPITAL_COMMUNITY): Payer: Self-pay | Admitting: Neurosurgery

## 2013-06-23 DIAGNOSIS — Y838 Other surgical procedures as the cause of abnormal reaction of the patient, or of later complication, without mention of misadventure at the time of the procedure: Secondary | ICD-10-CM

## 2013-06-23 DIAGNOSIS — T8140XA Infection following a procedure, unspecified, initial encounter: Principal | ICD-10-CM

## 2013-06-23 MED ORDER — SODIUM CHLORIDE 0.9 % IJ SOLN
10.0000 mL | INTRAMUSCULAR | Status: DC | PRN
  Administered 2013-06-23 – 2013-06-24 (×2): 10 mL

## 2013-06-23 NOTE — Progress Notes (Signed)
No problems noted through out the night, V/S WNL, BP- 101/51.No c/o headache. Pt voiding without any problems.

## 2013-06-23 NOTE — Care Management Note (Unsigned)
    Page 1 of 1   06/23/2013     4:00:18 PM CARE MANAGEMENT NOTE 06/23/2013  Patient:  [name] [name]   Account Number:  [id number]  Date Initiated:  06/23/2013  Documentation initiated by:  [name] [name]  Subjective/Objective Assessment:   Patient was admitted with post-op wound infection. Underwent I&D this admit.  Lives at home with daughter [name] [name].     Action/Plan:   Will follow for discharge needs. IV ATB therapy at home.   Anticipated DC Date:  06/24/2013   Anticipated DC Plan:  [place]  CM consult      Choice offered to / List presented to:  C-1 Patient           Status of service:  In process, will continue to follow Medicare Important Message given?   (If response is "NO", the following Medicare IM given date fields will be blank) Date Medicare IM given:   Date Additional Medicare IM given:    Discharge Disposition:    Per UR Regulation:    If discussed at Long Length of Stay Meetings, dates discussed:    Comments:  06/23/13 [place], MSN, [place] with [name] [name] at [facility] [phone]), who has accepted the referral pending the completion of antibiotic orders.  Per [name] [name], once orders are recieved, CM should contact either [name] [name] or [name] [name] at [facility] as they will be providing the medications. [phone].  CM will continue to follow.   06/23/13 [place], MSN, CM- Met with patient to discuss home health needs.  Patient staties that he had been discharged to [facility] after his initial surgery and was set up with [facility] upon discharge from the SNF.  He is interested in continuing to use [name] [name]. Voicemail message was left with [name] [name] from [facility] regarding need for IV ATB for probable discharge home tomorrow. Awaiting official antibiotic orders from ID, who have not yet seen the patient for consult.  RN is aware that patient needs to be seen and orders  will need to be written prior to discharge.  Awaiting return call from [place] with [facility].

## 2013-06-23 NOTE — Consult Note (Signed)
[facility]  Date of Admission:  06/22/2013  Date of Consult:  06/23/2013  Reason for Consult: Wound infection Referring Physician: Arnoldo Morale  Impression/Recommendation Wound Infection  Would Continue vanco Continue ceftriaxone, could consider cefepime if Cx (-) to give broader GNR coverage.  Await Cx from [place] placed, would agree that he will need 4-6 weeks of anbx for this wound infection.    Thank you so much for this interesting consult,   Campbell Riches (pager) [phone] [email or web address]  [name] [name] is an 80 y.o. male.  HPI:  80 yo M with hx of afib on coumadin, adm on 4-1 with dizziness. He was found to have a L cerebellar hemorrhage and he underwent emergent craniectomy. By 4-11 he was able to be d/c to SNF.  He was seen in f/u and was noted to have wound d/c. He was started on keflex without improvement, then switched to doxy. He presented 5-11 and underwent I & D of his wound. Firm cheesy material was found in wound in OR.  Post-op he was started on vanco/ceftriaxone.  He has been afebrile, his WBC has been normal, his Cx is ngtd.   Past Medical History  Diagnosis Date  . A-fib   . Peripheral arterial disease   . DVT (deep venous thrombosis)     history of prior to amputation  . Cerebral aneurysm     history of cerebral hemorrhage due to brain aneurysm rupture 25 yrs ago  . DJD (degenerative joint disease)   . Urethral stricture     history of dilated  . Complication of anesthesia     had Atrial Fib.- per patient  . Stroke     patient unaware  . Dysrhythmia     PAF  . History of kidney stones     passed all except 1.  . HOH (hard of hearing)     Past Surgical History  Procedure Laterality Date  . Varicose vein surgery    . Appendectomy    . Prostatectomy    . Tonsillectomy and adenoidectomy    . Above knee leg amputation      left  . Suboccipital craniectomy cervical laminectomy Left 05/13/2013   Procedure: Left SUBOCCIPITAL CRANIECTOMY;  Surgeon: Ophelia Charter, MD;  Location: [facility];  Service: Neurosurgery;  Laterality: Left;  . Cystoscopy      kidney stone  . Hernia repair Left   . Cyst excision      above rectum  . Lumbar wound debridement N/A 06/22/2013    Procedure: incision and drainage of posterior cervical wound;  Surgeon: Ophelia Charter, MD;  Location: [facility];  Service: Neurosurgery;  Laterality: N/A;     Allergies  Allergen Reactions  . Coumadin [Warfarin Sodium] Other (See Comments)    Caused bleeding in brain    Medications:  Scheduled: . amiodarone  100 mg Oral Daily  . cefTRIAXone (ROCEPHIN)  IV  1 g Intravenous Q24H  . docusate sodium  100 mg Oral BID  . multivitamin with minerals  1 tablet Oral Daily  . vancomycin  1,000 mg Intravenous Q12H    Abtx:  Anti-infectives   Start     Dose/Rate Route Frequency Ordered Stop   06/22/13 2200  vancomycin (VANCOCIN) IVPB 1000 mg/200 mL premix     1,000 mg 200 mL/hr over 60 Minutes Intravenous Every 12 hours 06/22/13 1857     06/22/13 2000  cefTRIAXone (ROCEPHIN) 1 g in  dextrose 5 % 50 mL IVPB     1 g 100 mL/hr over 30 Minutes Intravenous Every 24 hours 06/22/13 1857     06/22/13 1648  bacitracin 50,000 Units in sodium chloride irrigation 0.9 % 500 mL irrigation  Status:  Discontinued       As needed 06/22/13 1648 06/22/13 1719   06/22/13 0600  ceFAZolin (ANCEF) IVPB 2 g/50 mL premix     2 g 100 mL/hr over 30 Minutes Intravenous On call to O.R. 06/21/13 1446 06/22/13 1615      Total days of antibiotics 2 (vanco/ceftriaxone)          Social History:  reports that he has never smoked. He has never used smokeless tobacco. He reports that he does not drink alcohol or use illicit drugs.  History reviewed. No pertinent family history.  General ROS: no f/c, normal appetite, normal BM, normal urination, see HPI.   Blood pressure 100/43, pulse 70, temperature 99.6 F (37.6 C), temperature source  Oral, resp. rate 18, height 5' 10"  (1.778 m), weight 83.462 kg (184 lb), SpO2 97.00%. General appearance: alert, cooperative and no distress Eyes: negative findings: conjunctivae and sclerae normal and pupils equal, round, reactive to light and accomodation Throat: normal findings: oropharynx pink & moist without lesions or evidence of thrush Neck: posterior cervical neck wound has mild-mod erythema. there is bloody d/c from inferior portion. miod tenderness. Lungs: clear to auscultation bilaterally Heart: regular rate and rhythm Abdomen: normal findings: bowel sounds normal and soft, non-tender Extremities: edema none RLE. RUE PIC clean.  And LLE aka .   Results for orders placed during the hospital encounter of 06/22/13 (from the past 48 hour(s))  BASIC METABOLIC PANEL     Status: Abnormal   Collection Time    06/22/13 12:27 PM      Result Value Ref Range   Sodium 142  137 - 147 mEq/L   Potassium 4.4  3.7 - 5.3 mEq/L   Chloride 105  96 - 112 mEq/L   CO2 25  19 - 32 mEq/L   Glucose, Bld 107 (*) 70 - 99 mg/dL   BUN 16  6 - 23 mg/dL   Creatinine, Ser 0.88  0.50 - 1.35 mg/dL   Calcium 8.7  8.4 - 10.5 mg/dL   GFR calc non Af Amer 76 (*) >90 mL/min   GFR calc Af Amer 88 (*) >90 mL/min   Comment: (NOTE)     The eGFR has been calculated using the CKD EPI equation.     This calculation has not been validated in all clinical situations.     eGFR's persistently <90 mL/min signify possible Chronic Kidney     Disease.  CBC     Status: Abnormal   Collection Time    06/22/13 12:27 PM      Result Value Ref Range   WBC 8.1  4.0 - 10.5 K/uL   RBC 4.67  4.22 - 5.81 MIL/uL   Hemoglobin 12.8 (*) 13.0 - 17.0 g/dL   HCT 40.1  39.0 - 52.0 %   MCV 85.9  78.0 - 100.0 fL   MCH 27.4  26.0 - 34.0 pg   MCHC 31.9  30.0 - 36.0 g/dL   RDW 14.4  11.5 - 15.5 %   Platelets 200  150 - 400 K/uL  ANAEROBIC CULTURE     Status: None   Collection Time    06/22/13  5:07 PM      Result Value Ref Range  Specimen Description WOUND NECK     Special Requests POSTERIOR CERVICAL WOUND     Gram Stain       Value: FEW WBC PRESENT,BOTH PMN AND MONONUCLEAR     NO SQUAMOUS EPITHELIAL CELLS SEEN     NO ORGANISMS SEEN     Performed at [facility]   Culture       Value: NO ANAEROBES ISOLATED; CULTURE IN PROGRESS FOR 5 DAYS     Performed at [facility]   Report Status PENDING    WOUND CULTURE     Status: None   Collection Time    06/22/13  5:07 PM      Result Value Ref Range   Specimen Description WOUND NECK     Special Requests POSTERIOR CERVICAL WOUND     Gram Stain       Value: FEW WBC PRESENT,BOTH PMN AND MONONUCLEAR     NO SQUAMOUS EPITHELIAL CELLS SEEN     NO ORGANISMS SEEN     Performed at [facility]   Culture       Value: NO GROWTH 1 DAY     Performed at [facility]   Report Status PENDING        Component Value Date/Time   SDES WOUND NECK 06/22/2013 1707   SDES WOUND NECK 06/22/2013 1707   [place] CERVICAL WOUND 06/22/2013 1707   SPECREQUEST POSTERIOR CERVICAL WOUND 06/22/2013 1707   CULT  Value: NO ANAEROBES ISOLATED; CULTURE IN PROGRESS FOR 5 DAYS Performed at [facility] 06/22/2013 1707   CULT  Value: NO GROWTH 1 DAY Performed at [facility] 06/22/2013 1707   REPTSTATUS PENDING 06/22/2013 1707   REPTSTATUS PENDING 06/22/2013 1707   Dg Chest Port 1 View  06/23/2013   CLINICAL DATA:  A new PICC  EXAM: PORTABLE CHEST - 1 VIEW  COMPARISON:  Prior chest x-ray 09/24/2008  FINDINGS: Right upper extremity approach PICC. Catheter tip in good position at the superior cavoatrial junction. Borderline cardiomegaly. Atherosclerotic calcifications in the transverse descending thoracic aorta are similar compared to prior. Inspiratory volumes are low with bibasilar atelectasis. No overt pulmonary edema, pneumothorax or pleural effusion. Partial opacification of the left costophrenic angle similar compared to prior and likely related to  prominent epicardial fat pad. No acute osseous abnormality.  IMPRESSION: 1. The tip of the right upper extremity approach PICC projects over the superior cavoatrial junction. 2. Low inspiratory volumes with bibasilar atelectasis.   Electronically Signed   By: Jacqulynn Cadet M.D.   On: 06/23/2013 13:44   Recent Results (from the past 240 hour(s))  ANAEROBIC CULTURE     Status: None   Collection Time    06/22/13  5:07 PM      Result Value Ref Range Status   Specimen Description WOUND NECK   Final   Special Requests POSTERIOR CERVICAL WOUND   Final   Gram Stain     Final   Value: FEW WBC PRESENT,BOTH PMN AND MONONUCLEAR     NO SQUAMOUS EPITHELIAL CELLS SEEN     NO ORGANISMS SEEN     Performed at [facility]   Culture     Final   Value: NO ANAEROBES ISOLATED; CULTURE IN PROGRESS FOR 5 DAYS     Performed at [facility]   Report Status PENDING   Incomplete  WOUND CULTURE     Status: None   Collection Time    06/22/13  5:07 PM  Result Value Ref Range Status   Specimen Description WOUND NECK   Final   Special Requests POSTERIOR CERVICAL WOUND   Final   Gram Stain     Final   Value: FEW WBC PRESENT,BOTH PMN AND MONONUCLEAR     NO SQUAMOUS EPITHELIAL CELLS SEEN     NO ORGANISMS SEEN     Performed at [facility]   Culture     Final   Value: NO GROWTH 1 DAY     Performed at [facility]   Report Status PENDING   Incomplete      06/23/2013, 4:31 PM     LOS: 1 day     **Disclaimer: This note may have been dictated with voice recognition software. Similar sounding words can inadvertently be transcribed and this note may contain transcription errors which may not have been corrected upon publication of note.**

## 2013-06-23 NOTE — Progress Notes (Signed)
Patient ID: [name] [name], male   DOB: 11/28/1926, 80 y.o.   MRN: [id number] Subjective:  The patient is alert and pleasant. He looks well. He is in no apparent distress.  Objective: Vital signs in last 24 hours: Temp:  [97.3 F (36.3 C)-98.6 F (37 C)] 97.8 F (36.6 C) (05/12 0456) Pulse Rate:  [68-90] 76 (05/12 0456) Resp:  [16-24] 18 (05/12 0456) BP: (101-131)/(43-70) 101/51 mmHg (05/12 0456) SpO2:  [97 %-100 %] 100 % (05/12 0456) Weight:  [83.462 kg (184 lb)] 83.462 kg (184 lb) (05/11 1304)  Intake/Output from previous day:   Intake/Output this shift:    Physical exam the patient is alert and pleasant. He is moving all 4 extremities well. His dressing has a small bloodstain.  Lab Results:  Recent Labs  06/22/13 1227  WBC 8.1  HGB 12.8*  HCT 40.1  PLT 200   BMET  Recent Labs  06/22/13 1227  NA 142  K 4.4  CL 105  CO2 25  GLUCOSE 107*  BUN 16  CREATININE 0.88  CALCIUM 8.7    Studies/Results: No results found.  Assessment/Plan: Postop day 1: I will ask infectious disease to see the patient to help us with antibiotic selection and duration. Will have a PICC line placed. Hopefully we can arrange for the patient to get home IV antibiotics and possibly go home today or tomorrow.  LOS: 1 day     Cristi LoronJeffrey D Deshaun Schou 06/23/2013, 7:34 AM

## 2013-06-23 NOTE — Progress Notes (Signed)
Peripherally Inserted Central Catheter/Midline Placement  The IV Nurse has discussed with the patient and/or persons authorized to consent for the patient, the purpose of this procedure and the potential benefits and risks involved with this procedure.  The benefits include less needle sticks, lab draws from the catheter and patient may be discharged home with the catheter.  Risks include, but not limited to, infection, bleeding, blood clot (thrombus formation), and puncture of an artery; nerve damage and irregular heat beat.  Alternatives to this procedure were also discussed.  PICC/Midline Placement Documentation  PICC / Midline Single Lumen 06/23/13 PICC Right Basilic 42 cm 0 cm (Active)  Indication for Insertion or Continuance of Line Home intravenous therapies (PICC only) 06/23/2013 12:59 PM  Exposed Catheter (cm) 0 cm 06/23/2013 12:59 PM  Dressing Change Due 06/30/13 06/23/2013 12:59 PM       [name] RobinsonJessica Ann Poff 06/23/2013, 12:59 PM

## 2013-06-24 MED ORDER — HEPARIN SOD (PORK) LOCK FLUSH 100 UNIT/ML IV SOLN
250.0000 [IU] | INTRAVENOUS | Status: AC | PRN
  Administered 2013-06-24: 250 [IU]

## 2013-06-24 NOTE — Discharge Summary (Signed)
  Physician Discharge Summary  Patient ID: [name] BlamerJohn [name] MRN: [id number] DOB/AGE: 78/05/1926 78 y.o.  Admit date: 06/22/2013 Discharge date: 06/24/2013  Admission Diagnoses: Wound infection  Discharge Diagnoses: The same Active Problems:   Wound infection   Discharged Condition: good  Hospital Course: I performed an incision and drainage of the patient's posterior cervical/suboccipital wound on 06/22/2013. We obtained cultures. At the time of this dictation the cultures are negative. I asked infectious disease to see the patient. The patient was seen by Dr. Ninetta LightsHatcher. He has recommended 4-6 weeks of home IV antibiotics, presently vancomycin and ceftriaxone. But the cultures are pending. A PICC line was placed. Arrangements were made for home IV antibiotics. Patient was discharged to home on 06/24/2013. Patient was given oral and written discharge instructions. All his questions were answered.  Consults: Infectious disease Significant Diagnostic Studies: None Treatments: Incision and drainage of wound Discharge Exam: Blood pressure 117/56, pulse 68, temperature 97.8 F (36.6 C), temperature source Tympanic, resp. rate 18, height 5\' 10"  (1.778 m), weight 83.462 kg (184 lb), SpO2 99.00%. The patient is alert and pleasant. He is moving all 4 extremities. His dressing has a small old bloodstain.  Disposition: Home  Discharge Orders   Future Orders Complete By Expires   Call MD for:  difficulty breathing, headache or visual disturbances  As directed    Call MD for:  extreme fatigue  As directed    Call MD for:  hives  As directed    Call MD for:  persistant dizziness or light-headedness  As directed    Call MD for:  persistant nausea and vomiting  As directed    Call MD for:  redness, tenderness, or signs of infection (pain, swelling, redness, odor or green/yellow discharge around incision site)  As directed    Call MD for:  severe uncontrolled pain  As directed    Call MD for:  temperature  >100.4  As directed    Diet - low sodium heart healthy  As directed    Discharge instructions  As directed    Driving Restrictions  As directed    Increase activity slowly  As directed    Lifting restrictions  As directed    May shower / Bathe  As directed    No dressing needed  As directed    Nursing communication  As directed    Scheduling Instructions:   Please change the patient's dressing.       Medication List    STOP taking these medications       cephALEXin 500 MG capsule  Commonly known as:  KEFLEX     doxycycline 100 MG capsule  Commonly known as:  VIBRAMYCIN     eucerin lotion     PRESCRIPTION MEDICATION      TAKE these medications       amiodarone 200 MG tablet  Commonly known as:  PACERONE  Take 100 mg by mouth daily.     multivitamin with minerals Tabs tablet  Take 1 tablet by mouth daily.         Signed: Cristi LoronJeffrey D Reegan Mctighe 06/24/2013, 8:04 AM

## 2013-06-24 NOTE — Progress Notes (Signed)
[facility]; [facility] [phone]) and orders for IV antibiotic faxed to [facility] ( the infusion company that [facility] is contracted with for all home IV antibiotics - fax # [phone]). Abelino DerrickB Jahrel Borthwick RN,BSN,MHA [phone]

## 2013-06-24 NOTE — Progress Notes (Signed)
Called by nursing staff re: d/c meds He will continue on his current meds at home via IV Vancomycin 1g q12h ivpb Ceftriaxone 1g q24h ivpb He will stay on these for 6 weeks He needs weekly cbc, cmp, esr, crp, vanco trough His vanco dosing will be adjusted by his home health pharmacy protocol.  He needs a f/u visit in ID clinic in 2-3 weeks. Please call [phone] to make this appt.

## 2013-06-24 NOTE — Progress Notes (Signed)
Pt discharge education and instructions completed with pt and daughter at side. Both voices understanding and denies any questions. Pt PICC remain hep locked and intact; pt and daughter inform Home Health RN to be coming to administer the his antibiotics as ordered. Pt peripheral IV removed from hand. Pt neck dsg changed as ordered by MD. dsg remains clean dry and intact. Pt transported off unit via wheelchair with his belongings and daughter at side. Arabella MerlesP. Amo Trishelle Devora RN.

## 2013-06-25 LAB — WOUND CULTURE: Culture: NO GROWTH

## 2013-06-27 LAB — ANAEROBIC CULTURE

## 2013-07-06 ENCOUNTER — Other Ambulatory Visit: Payer: Self-pay | Admitting: Cardiology

## 2013-07-14 ENCOUNTER — Telehealth: Payer: Self-pay | Admitting: Licensed Clinical Social Worker

## 2013-07-14 NOTE — Telephone Encounter (Signed)
RN from [facility] nursing called to see if we could administer cath flow to the patient at his appointment tomorrow to see Dr. Luciana Axe. I explained that we do not administer this in our office, the patient would have to go to the ED or urgent care. RN wanted me to send this to MD seeing the patient so he could address this with him at the appointment.

## 2013-07-15 ENCOUNTER — Telehealth: Payer: Self-pay | Admitting: *Deleted

## 2013-07-15 ENCOUNTER — Ambulatory Visit (INDEPENDENT_AMBULATORY_CARE_PROVIDER_SITE_OTHER): Payer: Medicare Other | Admitting: Internal Medicine

## 2013-07-15 ENCOUNTER — Encounter: Payer: Self-pay | Admitting: Internal Medicine

## 2013-07-15 VITALS — BP 126/71 | HR 82 | Temp 97.8°F | Ht 70.0 in | Wt 186.0 lb

## 2013-07-15 DIAGNOSIS — L089 Local infection of the skin and subcutaneous tissue, unspecified: Secondary | ICD-10-CM

## 2013-07-15 DIAGNOSIS — T148XXA Other injury of unspecified body region, initial encounter: Principal | ICD-10-CM

## 2013-07-15 NOTE — Progress Notes (Signed)
   Subjective:    Patient ID: [name] [name], male    DOB: 04-11-1926, 80 y.o.   MRN: [id number]  HPI Here for hospital follow up.  He was previously on coumadin and in April developed dizziness and noted L cerebellar hemorrhage.  Underwent craniectomy but returned due to wound drainage.  Was started on Keflex, then doxy and did not improve so went to OR for I and D.  Started on vancomycin and ceftriaxone for a projected 4-6 weeks.  Culture remained negative.     Comes in today for follow up about 3-4 weeks into therapy.  No fever, no chills.  No diarrhea.   Review of Systems  Gastrointestinal: Negative for nausea and diarrhea.  Musculoskeletal: Negative for joint swelling.  Skin: Negative for rash.       Objective:   Physical Exam  Constitutional: He appears well-developed and well-nourished. No distress.  Cardiovascular: Normal rate, regular rhythm and normal heart sounds.   No murmur heard. Pulmonary/Chest: Effort normal and breath sounds normal. No respiratory distress. He has no wheezes.  Skin: Skin is warm and dry. No rash noted.          Assessment & Plan:

## 2013-07-15 NOTE — Telephone Encounter (Signed)
[facility] unable to administer cathflow, patient will have to go to short stay if he is having difficulties with his PICC line.  Pt and daughter verbalized understanding.  Per Dr. Luciana Axe, gave order to [facility] nursing to stop antibiotics, pull picc 6/21.  Patient notified. Andree Coss, RN

## 2013-07-15 NOTE — Assessment & Plan Note (Addendum)
Doing well though PICC not working.  Will plan to continue through June 21st and stop.  Pull picc by [facility] on the 21st after last dose.  Follow up after picc pulled to be sure wound is stable.

## 2013-07-28 ENCOUNTER — Telehealth: Payer: Self-pay | Admitting: *Deleted

## 2013-07-28 NOTE — Telephone Encounter (Signed)
Patient and his daughter called to verify his stop date as the pharmacy is trying to deliver a weeks worth of medication. The patient stop date is 08/02/13, verified with the doctor and that is the correct stop date. [facility] and verified the stop date and the D/C of PICC. Patient is aware and happy.

## 2013-08-12 ENCOUNTER — Encounter: Payer: Self-pay | Admitting: Infectious Diseases

## 2013-08-17 ENCOUNTER — Ambulatory Visit (INDEPENDENT_AMBULATORY_CARE_PROVIDER_SITE_OTHER): Payer: Medicare Other | Admitting: Internal Medicine

## 2013-08-17 ENCOUNTER — Encounter: Payer: Self-pay | Admitting: Internal Medicine

## 2013-08-17 VITALS — BP 145/83 | HR 77 | Temp 97.9°F

## 2013-08-17 DIAGNOSIS — Z5189 Encounter for other specified aftercare: Secondary | ICD-10-CM

## 2013-08-17 DIAGNOSIS — T798XXD Other early complications of trauma, subsequent encounter: Secondary | ICD-10-CM

## 2013-08-17 DIAGNOSIS — T148XXA Other injury of unspecified body region, initial encounter: Secondary | ICD-10-CM

## 2013-08-17 DIAGNOSIS — L089 Local infection of the skin and subcutaneous tissue, unspecified: Secondary | ICD-10-CM

## 2013-08-17 NOTE — Progress Notes (Signed)
   Subjective:    Patient ID: [name] BlamerJohn [name], male    DOB: 04/28/1926, 80 y.o.   MRN: [id number]  HPI  Here for hospital follow up.  He was previously on coumadin and in April developed dizziness and noted L cerebellar hemorrhage.  Underwent craniectomy but returned due to wound drainage.  Was started on Keflex, then doxy and did not improve so went to OR for I and D.  Started on vancomycin and ceftriaxone and completed 6 weeks on June 21st.  Culture remained negative.     Comes in today for follow up after completion. Feels great.  No drainage, no pain, picc out.  No issues.    Review of Systems  Gastrointestinal: Negative for nausea and diarrhea.  Musculoskeletal: Negative for joint swelling.  Skin: Negative for rash.       Objective:   Physical Exam  Constitutional: He appears well-developed and well-nourished. No distress.  Cardiovascular: Normal rate, regular rhythm and normal heart sounds.   No murmur heard. Pulmonary/Chest: Effort normal and breath sounds normal. No respiratory distress. He has no wheezes.  Skin: Skin is warm and dry. No rash noted.          Assessment & Plan:

## 2013-08-17 NOTE — Assessment & Plan Note (Signed)
Completed and doing great  RTC PRN

## 2013-11-25 ENCOUNTER — Encounter: Payer: Self-pay | Admitting: Neurosurgery

## 2013-11-25 ENCOUNTER — Encounter: Payer: Self-pay | Admitting: Internal Medicine

## 2013-12-07 ENCOUNTER — Encounter: Payer: Self-pay | Admitting: Cardiology

## 2013-12-07 ENCOUNTER — Ambulatory Visit (INDEPENDENT_AMBULATORY_CARE_PROVIDER_SITE_OTHER): Payer: Medicare Other | Admitting: Cardiology

## 2013-12-07 VITALS — BP 120/70 | HR 74 | Ht 70.0 in | Wt 186.0 lb

## 2013-12-07 DIAGNOSIS — I480 Paroxysmal atrial fibrillation: Secondary | ICD-10-CM

## 2013-12-07 NOTE — Patient Instructions (Signed)
We will check lab work today  Continue your current therapy  I will see you in 6 months.   

## 2013-12-07 NOTE — Progress Notes (Signed)
[name] [name] Date of Birth: 06/26/1926 Medical Record #[id number]  History of Present Illness: [name] [name] is seen today for follow up.  He was admitted 4/1-4/11/15 after he presented with acute disequilibrium. 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 His coumadin was stopped.  He has atrial fib and has been on chronic amiodarone. Other issues include PAD, DVT, remote cerebral aneurysm, DJD, and past amputations of the left leg. He denies any chest pain or SOB. No palpitations. Denies any residual from cerebral bleed. He did have an infection at the cranioiomy site that has cleared.  Current Outpatient Prescriptions on File Prior to Visit  Medication Sig Dispense Refill  . amiodarone (PACERONE) 200 MG tablet TAKE 1/2 TABLET EVERY DAY  30 tablet  3  . Multiple Vitamin (MULTIVITAMIN WITH MINERALS) TABS tablet Take 1 tablet by mouth daily.       No current facility-administered medications on file prior to visit.    Allergies  Allergen Reactions  . Coumadin [Warfarin Sodium] Other (See Comments)    Caused bleeding in brain    Past Medical History  Diagnosis Date  . A-fib   . Peripheral arterial disease   . DVT (deep venous thrombosis)     history of prior to amputation  . Cerebral aneurysm     history of cerebral hemorrhage due to brain aneurysm rupture 25 yrs ago  . DJD (degenerative joint disease)   . Urethral stricture     history of dilated  . Complication of anesthesia     had Atrial Fib.- per patient  . Stroke     patient unaware  . Dysrhythmia     PAF  . History of kidney stones     passed all except 1.  . HOH (hard of hearing)     Past Surgical History  Procedure Laterality Date  . Varicose vein surgery    . Appendectomy    . Prostatectomy    . Tonsillectomy and adenoidectomy    . Above knee leg amputation      left  . Suboccipital craniectomy cervical laminectomy Left 05/13/2013    Procedure: Left SUBOCCIPITAL CRANIECTOMY;  Surgeon: Cristi LoronJeffrey D  Jenkins, MD;  Location: [facility];  Service: Neurosurgery;  Laterality: Left;  . Cystoscopy      kidney stone  . Hernia repair Left   . Cyst excision      above rectum  . Lumbar wound debridement N/A 06/22/2013    Procedure: incision and drainage of posterior cervical wound;  Surgeon: [name], MD;  Location: [facility];  Service: Neurosurgery;  Laterality: N/A;    History  Smoking status  . Never Smoker   Smokeless tobacco  . Never Used    History  Alcohol Use No    No family history on file.  Review of Systems: The review of systems is per the HPI.  All other systems were reviewed and are negative.  Physical Exam: BP 120/70  Pulse 74  Ht 5\' 10"  (1.778 m)  Wt 186 lb (84.369 kg)  BMI 26.69 kg/m2  He is seen in a wheelchair.  Patient is  pleasant and in no acute distress. Skin is warm and dry. Color is normal.  HEENT is unremarkable. Normocephalic/atraumatic. PERRL. Sclera are nonicteric.  No JVD. Lungs are clear. Cardiac exam shows a regular rhythm. Abdomen is soft. Extremities are without edema. Gait is not tested. Left AKA. ROM appears intact. He is alert and oriented x 3.   LABORATORY  DATA:    Lab Results  Component Value Date   WBC 8.1 06/22/2013   HGB 12.8* 06/22/2013   HCT 40.1 06/22/2013   PLT 200 06/22/2013   GLUCOSE 107* 06/22/2013   ALT 25 12/03/2012   AST 23 12/03/2012   NA 142 06/22/2013   K 4.4 06/22/2013   CL 105 06/22/2013   CREATININE 0.88 06/22/2013   BUN 16 06/22/2013   CO2 25 06/22/2013   TSH 1.76 12/03/2012   INR 1.29 05/16/2013   HGBA1C  Value: 5.9 (NOTE) The ADA recommends the following therapeutic goal for glycemic control related to Hgb A1c measurement: Goal of therapy: <6.5 Hgb A1c  Reference: American Diabetes Association: Clinical Practice Recommendations 2010, Diabetes Care, 2010, 33: (Suppl  1). 09/29/2008     Assessment / Plan:  1. PAF -  maintaining sinus rhythm on amiodarone. Will follow up LFTs and TSH. Not a candidate for  anticoagulation due to cerebral bleed.  2. 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 S/p surgical evacuation. Not a candidate for anticoagulation anymore.  3. PAD - past leg amputation.    I will follow up in 6 months.

## 2013-12-08 LAB — HEPATIC FUNCTION PANEL
ALT: 18 U/L (ref 0–53)
AST: 19 U/L (ref 0–37)
Albumin: 3.9 g/dL (ref 3.5–5.2)
Alkaline Phosphatase: 56 U/L (ref 39–117)
BILIRUBIN INDIRECT: 0.5 mg/dL (ref 0.2–1.2)
Bilirubin, Direct: 0.1 mg/dL (ref 0.0–0.3)
TOTAL PROTEIN: 7.4 g/dL (ref 6.0–8.3)
Total Bilirubin: 0.6 mg/dL (ref 0.2–1.2)

## 2013-12-08 LAB — TSH: TSH: 2.742 u[IU]/mL (ref 0.350–4.500)

## 2014-03-18 ENCOUNTER — Telehealth: Payer: Self-pay | Admitting: Cardiology

## 2014-03-18 DIAGNOSIS — I482 Chronic atrial fibrillation, unspecified: Secondary | ICD-10-CM

## 2014-03-18 NOTE — Telephone Encounter (Signed)
Returned call to patient he stated he would like to have lab work done before he sees Dr.Jordan in April.Fasting lab order mailed to patient.

## 2014-03-18 NOTE — Telephone Encounter (Signed)
Pt called in wanting to know if he needed to have some labs done before coming in to see Dr. SwazilandJordan. Please call  Thanks

## 2014-03-24 ENCOUNTER — Other Ambulatory Visit: Payer: Self-pay

## 2014-03-24 MED ORDER — AMIODARONE HCL 200 MG PO TABS: 100.0000 mg | ORAL_TABLET | Freq: Every day | ORAL | Status: DC

## 2014-04-13 ENCOUNTER — Ambulatory Visit: Payer: Self-pay | Admitting: Cardiovascular Disease

## 2014-04-13 DIAGNOSIS — I480 Paroxysmal atrial fibrillation: Secondary | ICD-10-CM

## 2014-05-30 ENCOUNTER — Inpatient Hospital Stay (HOSPITAL_COMMUNITY)
Admission: EM | Admit: 2014-05-30 | Discharge: 2014-05-31 | DRG: 066 | Disposition: A | Discharge status: Home / Self Care | Payer: Medicare Other | Attending: Internal Medicine | Admitting: Internal Medicine

## 2014-05-30 ENCOUNTER — Inpatient Hospital Stay (HOSPITAL_COMMUNITY): Payer: Medicare Other

## 2014-05-30 ENCOUNTER — Encounter (HOSPITAL_COMMUNITY): Payer: Self-pay | Admitting: Emergency Medicine

## 2014-05-30 ENCOUNTER — Emergency Department (HOSPITAL_COMMUNITY): Payer: Medicare Other

## 2014-05-30 DIAGNOSIS — I480 Paroxysmal atrial fibrillation: Secondary | ICD-10-CM | POA: Diagnosis present

## 2014-05-30 DIAGNOSIS — Z6838 Body mass index (BMI) 38.0-38.9, adult: Secondary | ICD-10-CM

## 2014-05-30 DIAGNOSIS — G459 Transient cerebral ischemic attack, unspecified: Secondary | ICD-10-CM | POA: Diagnosis not present

## 2014-05-30 DIAGNOSIS — Z86718 Personal history of other venous thrombosis and embolism: Secondary | ICD-10-CM | POA: Diagnosis not present

## 2014-05-30 DIAGNOSIS — Z8673 Personal history of transient ischemic attack (TIA), and cerebral infarction without residual deficits: Secondary | ICD-10-CM

## 2014-05-30 DIAGNOSIS — I6340 Cerebral infarction due to embolism of unspecified cerebral artery: Secondary | ICD-10-CM | POA: Diagnosis present

## 2014-05-30 DIAGNOSIS — I482 Chronic atrial fibrillation: Secondary | ICD-10-CM

## 2014-05-30 DIAGNOSIS — E785 Hyperlipidemia, unspecified: Secondary | ICD-10-CM | POA: Diagnosis present

## 2014-05-30 DIAGNOSIS — E669 Obesity, unspecified: Secondary | ICD-10-CM | POA: Diagnosis present

## 2014-05-30 DIAGNOSIS — H9190 Unspecified hearing loss, unspecified ear: Secondary | ICD-10-CM | POA: Diagnosis present

## 2014-05-30 DIAGNOSIS — G458 Other transient cerebral ischemic attacks and related syndromes: Secondary | ICD-10-CM

## 2014-05-30 DIAGNOSIS — I639 Cerebral infarction, unspecified: Secondary | ICD-10-CM | POA: Diagnosis not present

## 2014-05-30 DIAGNOSIS — I4891 Unspecified atrial fibrillation: Secondary | ICD-10-CM | POA: Diagnosis not present

## 2014-05-30 DIAGNOSIS — Z89612 Acquired absence of left leg above knee: Secondary | ICD-10-CM | POA: Diagnosis not present

## 2014-05-30 DIAGNOSIS — Z79899 Other long term (current) drug therapy: Secondary | ICD-10-CM

## 2014-05-30 LAB — URINALYSIS, ROUTINE W REFLEX MICROSCOPIC
BILIRUBIN URINE: NEGATIVE
Glucose, UA: NEGATIVE mg/dL
Ketones, ur: NEGATIVE mg/dL
Nitrite: POSITIVE — AB
Protein, ur: NEGATIVE mg/dL
Specific Gravity, Urine: 1.019 (ref 1.005–1.030)
Urobilinogen, UA: 0.2 mg/dL (ref 0.0–1.0)
pH: 7 (ref 5.0–8.0)

## 2014-05-30 LAB — COMPREHENSIVE METABOLIC PANEL
ALBUMIN: 3.5 g/dL (ref 3.5–5.2)
ALK PHOS: 60 U/L (ref 39–117)
ALT: 30 U/L (ref 0–53)
AST: 27 U/L (ref 0–37)
Anion gap: 10 (ref 5–15)
BILIRUBIN TOTAL: 0.6 mg/dL (ref 0.3–1.2)
BUN: 18 mg/dL (ref 6–23)
CALCIUM: 9 mg/dL (ref 8.4–10.5)
CO2: 26 mmol/L (ref 19–32)
CREATININE: 0.97 mg/dL (ref 0.50–1.35)
Chloride: 103 mmol/L (ref 96–112)
GFR calc Af Amer: 84 mL/min — ABNORMAL LOW (ref >90)
GFR calc non Af Amer: 73 mL/min — ABNORMAL LOW (ref >90)
Glucose, Bld: 78 mg/dL (ref 70–99)
Potassium: 4 mmol/L (ref 3.5–5.1)
SODIUM: 139 mmol/L (ref 135–145)
TOTAL PROTEIN: 7.4 g/dL (ref 6.0–8.3)

## 2014-05-30 LAB — RAPID URINE DRUG SCREEN, HOSP PERFORMED
AMPHETAMINES: NOT DETECTED
Barbiturates: NOT DETECTED
Benzodiazepines: NOT DETECTED
Cocaine: NOT DETECTED
Opiates: NOT DETECTED
TETRAHYDROCANNABINOL: NOT DETECTED

## 2014-05-30 LAB — CBC
HEMATOCRIT: 43.4 % (ref 39.0–52.0)
Hemoglobin: 13.9 g/dL (ref 13.0–17.0)
MCH: 27.4 pg (ref 26.0–34.0)
MCHC: 32 g/dL (ref 30.0–36.0)
MCV: 85.6 fL (ref 78.0–100.0)
Platelets: 137 10*3/uL — ABNORMAL LOW (ref 150–400)
RBC: 5.07 MIL/uL (ref 4.22–5.81)
RDW: 14.1 % (ref 11.5–15.5)
WBC: 6.7 10*3/uL (ref 4.0–10.5)

## 2014-05-30 LAB — CBG MONITORING, ED: GLUCOSE-CAPILLARY: 78 mg/dL (ref 70–99)

## 2014-05-30 LAB — URINE MICROSCOPIC-ADD ON

## 2014-05-30 LAB — I-STAT TROPONIN, ED: Troponin i, poc: 0 ng/mL (ref 0.00–0.08)

## 2014-05-30 MED ORDER — HEPARIN SODIUM (PORCINE) 5000 UNIT/ML IJ SOLN
5000.0000 [IU] | Freq: Three times a day (TID) | INTRAMUSCULAR | Status: DC
  Administered 2014-05-30 – 2014-05-31 (×3): 5000 [IU] via SUBCUTANEOUS
  Filled 2014-05-30 (×3): qty 1

## 2014-05-30 MED ORDER — STROKE: EARLY STAGES OF RECOVERY BOOK
Freq: Once | Status: AC
  Administered 2014-05-30: 11

## 2014-05-30 MED ORDER — AMIODARONE HCL 200 MG PO TABS
100.0000 mg | ORAL_TABLET | Freq: Every day | ORAL | Status: DC
  Administered 2014-05-30 – 2014-05-31 (×2): 100 mg via ORAL
  Filled 2014-05-30 (×2): qty 1

## 2014-05-30 NOTE — H&P (Signed)
[facility] History and Physical  Denise Bramblett ZOX:[id number] DOB: [birth year] DOA: 05/30/2014  Referring physician: ED PCP: Lolita Patella, MD  Specialists:  Neuro  Chief Complaint:   81 y/o ? 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 Leaving church 05/30/14 ~ 0900-mumbling episode-staring out into space--Daughter notcied some vague symp-lasted 2-3 min.  patient seemed to slump into the chair as well as well as the less coherent. There was shaking of the right lower extremity and jerking episodes as well however no incontinence of urine or stool. 911 was called and patient was brought over here. Felt tired 4/16 p.m. which is unusual-did not wish supper-also slept in late which is unusual for him. Baseline level of function is he sits around and needs moderate assistance uses a wheelchair. Has lived with his daughter for the past 16 years in a joint family household    Review of Systems:  - Chest pain, - shortness breath, secondary to come + occasional sputum No diarrhea, no melena, no hematemesis, no dysuria, No prior similar episodes - Headache, - prior episode    Past Medical History  Diagnosis Date  . A-fib   . Peripheral arterial disease   . DVT (deep venous thrombosis)     history of prior to amputation  . Cerebral aneurysm     history of cerebral hemorrhage due to brain aneurysm rupture 25 yrs ago  . DJD (degenerative joint disease)   . Urethral stricture     history of dilated  . Complication of anesthesia     had Atrial Fib.- per patient  . Stroke     patient unaware  . Dysrhythmia     PAF  . History of kidney stones     passed all except 1.  . HOH (hard of hearing)    Past Surgical History  Procedure Laterality Date  . Varicose vein  surgery    . Appendectomy    . Prostatectomy    . Tonsillectomy and adenoidectomy    . Above knee leg amputation      left  . Suboccipital craniectomy cervical laminectomy Left 05/13/2013    Procedure: Left SUBOCCIPITAL CRANIECTOMY;  Surgeon: Cristi Loron, MD;  Location: [facility];  Service: Neurosurgery;  Laterality: Left;  . Cystoscopy      kidney stone  . Hernia repair Left   . Cyst excision      above rectum  . 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 Coumadin [Warfarin Sodium] Other (See Comments)    Caused bleeding in brain    History reviewed. No pertinent family history.   Prior to Admission medications   Medication Sig Start Date End Date Taking? Authorizing Provider  amiodarone (PACERONE) 200 MG tablet Take 0.5 tablets (100 mg total) by mouth daily. 03/24/14  Yes Peter M Sw[place]il[place], MD  Diaper Rash Products (DESITIN) OINT Apply 1 application topically daily as needed (  Breakouts on ankle).    Yes Historical Provider, MD  Multiple Vitamin (MULTIVITAMIN WITH MINERALS) TABS tablet Take 1 tablet by mouth every evening. [employer]   Yes Historical Provider, MD   Physical Exam: Filed Vitals:   05/30/14 1200 05/30/14 1330 05/30/14 1400 05/30/14 1430  BP: 142/59 126/54 152/63 145/56  Pulse: 76 71 70 73  Temp:      TempSrc:      Resp: 20 15  18   Height:      Weight:      SpO2: 97% 98% 97% 97%     EOMI NCAT no slurring of speech no droop of mouth, vision by direct confrontation intact, visual fields intact  S1-S2 no murmur rub or gallop appears to be in sinus  Clinically  clear left side however posterior right-sided slight crackles  Soft nontender nondistended no rebound or guarding  Range of motion intact  Sensory intact however dry skin to right lower extremity-left stump appears clean  Power 5/5, reflexes 2/3, cold touch is intact rest of neurological exam is intact  Labs on Admission:  Basic Metabolic Panel:  Recent Labs Lab 05/30/14 1146  NA 139  K 4.0  CL 103  CO2 26  GLUCOSE 78  BUN 18  CREATININE 0.97  CALCIUM 9.0   Liver Function Tests:  Recent Labs Lab 05/30/14 1146  AST 27  ALT 30  ALKPHOS 60  BILITOT 0.6  PROT 7.4  ALBUMIN 3.5   No results for input(s): LIPASE, AMYLASE in the last 168 hours. No results for input(s): AMMONIA in the last 168 hours. CBC:  Recent Labs Lab 05/30/14 1146  WBC 6.7  HGB 13.9  HCT 43.4  MCV 85.6  PLT 137*   Cardiac Enzymes: No results for input(s): CKTOTAL, CKMB, CKMBINDEX, TROPONINI in the last 168 hours.  BNP (last 3 results) No results for input(s): BNP in the last 8760 hours.  ProBNP (last 3 results) No results for input(s): PROBNP in the last 8760 hours.  CBG:  Recent Labs Lab 05/30/14 1140  GLUCAP 78    Radiological Exams on Admission: Dg Chest 2 View  05/30/2014   CLINICAL DATA:  Pt states he was at church this morning and his daughter said she was unable to understand what he was saying; pt states he had a stroke 1 yr ago and has h/o AFIB; non-smoker  EXAM: CHEST  2 VIEW  COMPARISON:  06/23/2013  FINDINGS: Cardiac silhouette is mildly enlarged. No mediastinal or hilar masses or evidence of adenopathy.  Lungs are clear.  No pleural effusion or pneumothorax.  Bony thorax is demineralized but grossly intact.  IMPRESSION: No acute cardiopulmonary disease.   Electronically Signed   By: Amie Portlandavid  Ormond M.D.   On: 05/30/2014 12:57   Ct Head Wo Contrast  05/30/2014   CLINICAL DATA:  Patient with brief a phasic apparent and right leg weakness.  EXAM: CT HEAD WITHOUT CONTRAST   TECHNIQUE: Contiguous axial images were obtained from the base of the skull through the vertex without intravenous contrast.  COMPARISON:  CT head 05/23/2013  FINDINGS: Ventricles and sulci are prominent, compatible with atrophy. Extensive periventricular and subcortical white matter hypodensity compatible with chronic small vessel ischemic changes. Are re- demonstrated left occipital craniectomy. Persistent encephalomalacia within the left cerebellar hemisphere. No evidence for acute cortically based infarct, intracranial hemorrhage, mass lesion or mass-effect. Paranasal sinuses are well aerated. Mastoid air cells are unremarkable.  IMPRESSION: Chronic small vessel ischemic changes.  No acute intracranial process.  Electronically Signed   By: Annia Belt M.D.   On: 05/30/2014 12:36    EKG: Independently reviewed. Sinus rhythm rate about 60 QRS axis left anterior fascicular block no ST-T wave changes low voltage complexes  Assessment/Plan Active Problems:   TIA (transient ischemic attack)-unclear if TIA versus new onset seizure given episodic shaking of right lower extremity. Neurology to kindly assess patient as uncomfortable placing this type of patient who had a non-provoked hemorrhagic stroke 2015 on any type of antiplatelet, anti-coagulant therapies at present time. I will cc his cardiologist to let him know as he was supposed to have seen Dr. Swaziland yesterday for TSH Paroxysmal A. Fib/chronic-CHad2Vasc2 score>4--represents a real risk for recurrence of CVA. Monitor Remote history DVT-see above discussion Status post AKA 09/2008-stable Obesity with BMI above 40-outpatient monitoring  75 minutes Discussed with daughter at bedside Full code    Rhetta Mura [facility] Pager [phone]  7aM-7AM, please contact night-coverage [email or web address] Password [facility] 05/30/2014, 2:58 PM

## 2014-05-30 NOTE — ED Provider Notes (Signed)
CSN: [id number]     Arrival date & time 05/30/14  1129 History   First MD Initiated Contact with Patient 05/30/14 1200     Chief Complaint  Patient presents with  . Transient Ischemic Attack     (Consider location/radiation/quality/duration/timing/severity/associated sxs/prior Treatment) HPI Comments: Pt presents with 3-4 min episode of mumbling speech and R leg weakness, now resolved with no complaints. Hx of CVA abut 1 yr ago with expressive aphasia. He has been fatigued recently, but otherwise no recent illnesses. Pt states he felt fine during episode except that he felt like he couldn't make his leg move.    Past Medical History  Diagnosis Date  . A-fib   . Peripheral arterial disease   . DVT (deep venous thrombosis)     history of prior to amputation  . Cerebral aneurysm     history of cerebral hemorrhage due to brain aneurysm rupture 25 yrs ago  . DJD (degenerative joint disease)   . Urethral stricture     history of dilated  . Complication of anesthesia     had Atrial Fib.- per patient  . Stroke     patient unaware  . Dysrhythmia     PAF  . History of kidney stones     passed all except 1.  . HOH (hard of hearing)    Past Surgical History  Procedure Laterality Date  . Varicose vein surgery    . Appendectomy    . Prostatectomy    . Tonsillectomy and adenoidectomy    . Above knee leg amputation      left  . Suboccipital craniectomy cervical laminectomy Left 05/13/2013    Procedure: Left SUBOCCIPITAL CRANIECTOMY;  Surgeon: Cristi Loron, MD;  Location: [facility];  Service: Neurosurgery;  Laterality: Left;  . Cystoscopy      kidney stone  . Hernia repair Left   . Cyst excision      above rectum  . Lumbar wound debridement N/A 06/22/2013    Procedure: incision and drainage of posterior cervical wound;  Surgeon: Cristi Loron, MD;  Location: [facility];  Service: Neurosurgery;  Laterality: N/A;   History reviewed. No pertinent family history. History   Substance Use Topics  . Smoking status: Never Smoker   . Smokeless tobacco: Never Used  . Alcohol Use: No    Review of Systems  Constitutional: Negative for fever, activity change, appetite change and fatigue.  HENT: Negative for congestion, facial swelling, rhinorrhea and trouble swallowing.   Eyes: Negative for photophobia and pain.  Respiratory: Negative for cough, chest tightness and shortness of breath.   Cardiovascular: Negative for chest pain and leg swelling.  Gastrointestinal: Negative for nausea, vomiting, abdominal pain, diarrhea and constipation.  Endocrine: Negative for polydipsia and polyuria.  Genitourinary: Negative for dysuria, urgency, decreased urine volume and difficulty urinating.  Musculoskeletal: Negative for back pain and gait problem.  Skin: Negative for color change, rash and wound.  Allergic/Immunologic: Negative for immunocompromised state.  Neurological: Positive for speech difficulty and weakness. Negative for dizziness, facial asymmetry, numbness and headaches.  Psychiatric/Behavioral: Negative for confusion, decreased concentration and agitation.      Allergies  Coumadin  Home Medications   Prior to Admission medications   Medication Sig Start Date End Date Taking? Authorizing Provider  amiodarone (PACERONE) 200 MG tablet Take 0.5 tablets (100 mg total) by mouth daily. 03/24/14  Yes Peter M [place], MD  Diaper Rash Products (DESITIN) OINT Apply 1 application topically daily as needed (Breakouts  on ankle).    Yes Historical Provider, MD  Multiple Vitamin (MULTIVITAMIN WITH MINERALS) TABS tablet Take 1 tablet by mouth every evening. [employer]   Yes Historical Provider, MD   BP 126/54 mmHg  Pulse 71  Temp(Src) 97.8 F (36.6 C) (Oral)  Resp 15  Ht 5\' 10"  (1.778 m)  Wt 265 lb (120.203 kg)  BMI 38.02 kg/m2  SpO2 98% Physical Exam  Constitutional: He is oriented to person, place, and time. He appears well-developed and well-nourished. No  distress.  HENT:  Head: Normocephalic and atraumatic.  Mouth/Throat: No oropharyngeal exudate.  Eyes: Pupils are equal, round, and reactive to light.  Neck: Normal range of motion. Neck supple.  Cardiovascular: Normal rate, regular rhythm and normal heart sounds.  Exam reveals no gallop and no friction rub.   No murmur heard. Pulmonary/Chest: Effort normal and breath sounds normal. No respiratory distress. He has no wheezes. He has no rales.  Abdominal: Soft. Bowel sounds are normal. He exhibits no distension and no mass. There is no tenderness. There is no rebound and no guarding.  Musculoskeletal: Normal range of motion. He exhibits no edema or tenderness.  Neurological: He is alert and oriented to person, place, and time. He has normal strength. He displays no atrophy and no tremor. No cranial nerve deficit or sensory deficit. He exhibits normal muscle tone. He displays no seizure activity. Coordination and gait normal. GCS eye subscore is 4. GCS verbal subscore is 5. GCS motor subscore is 6.  L leg AKA  Skin: Skin is warm and dry.  Psychiatric: He has a normal mood and affect.    ED Course  Procedures (including critical care time) Labs Review Labs Reviewed  CBC - Abnormal; Notable for the following:    Platelets 137 (*)    All other components within normal limits  COMPREHENSIVE METABOLIC PANEL - Abnormal; Notable for the following:    GFR calc non Af Amer 73 (*)    GFR calc Af Amer 84 (*)    All other components within normal limits  URINE RAPID DRUG SCREEN (HOSP PERFORMED)  URINALYSIS, ROUTINE W REFLEX MICROSCOPIC  I-STAT TROPOININ, ED  CBG MONITORING, ED    Imaging Review Dg Chest 2 View  05/30/2014   CLINICAL DATA:  Pt states he was at church this morning and his daughter said she was unable to understand what he was saying; pt states he had a stroke 1 yr ago and has h/o AFIB; non-smoker  EXAM: CHEST  2 VIEW  COMPARISON:  06/23/2013  FINDINGS: Cardiac silhouette is mildly  enlarged. No mediastinal or hilar masses or evidence of adenopathy.  Lungs are clear.  No pleural effusion or pneumothorax.  Bony thorax is demineralized but grossly intact.  IMPRESSION: No acute cardiopulmonary disease.   Electronically Signed   By: Amie Portlandavid  Ormond M.D.   On: 05/30/2014 12:57   Ct Head Wo Contrast  05/30/2014   CLINICAL DATA:  Patient with brief a phasic apparent and right leg weakness.  EXAM: CT HEAD WITHOUT CONTRAST  TECHNIQUE: Contiguous axial images were obtained from the base of the skull through the vertex without intravenous contrast.  COMPARISON:  CT head 05/23/2013  FINDINGS: Ventricles and sulci are prominent, compatible with atrophy. Extensive periventricular and subcortical white matter hypodensity compatible with chronic small vessel ischemic changes. Are re- demonstrated left occipital craniectomy. Persistent encephalomalacia within the left cerebellar hemisphere. No evidence for acute cortically based infarct, intracranial hemorrhage, mass lesion or mass-effect. Paranasal sinuses are  well aerated. Mastoid air cells are unremarkable.  IMPRESSION: Chronic small vessel ischemic changes.  No acute intracranial process.   Electronically Signed   By: Annia Belt M.D.   On: 05/30/2014 12:36     EKG Interpretation   Date/Time:  Sunday May 30 2014 11:40:08 EDT Ventricular Rate:  76 PR Interval:  133 QRS Duration: 92 QT Interval:  389 QTC Calculation: 437 R Axis:   -35 Text Interpretation:  Sinus rhythm Inferior infarct, old Consider anterior  infarct Baseline wander in lead(s) I LOW VOLTAGE Confirmed by Cyerra Yim   MD, Maaz Spiering ([id number]) on 05/30/2014 2:23:21 PM      MDM   Final diagnoses:  Transient cerebral ischemia, unspecified transient cerebral ischemia type    Pt is a 81 y.o. male with Pmhx as above who presents with 3-4 min episode of mumbling speech and R leg weakness, now resolved with no complaints. Hx of CVA abut 1 yr ago with expressive aphasia. He has been  fatigued recently, but otherwise no recent illnesses. On PE, VSS, pt in NAD. No focal neuro findings (AKA on R). CT head w/o acute changes, Labs grossly unremarkable. UA pending. [facility] consulted and will admit.        Toy Cookey, MD 05/31/14 2218

## 2014-05-30 NOTE — ED Notes (Signed)
Patient transported to CT 

## 2014-05-30 NOTE — Progress Notes (Signed)
Received from ER via stretcher; patient is alert and oriented; oriented patient to room and unit routine; reviewed fall safety measures/plan. Patient very cooperative.

## 2014-05-30 NOTE — Consult Note (Signed)
Referring Physician: Dr. Verlon Au    Chief Complaint: right leg weakness, slurred speech, decreased responsiveness.  HPI:                                                                                                                                         [name] [name] is an 81 y.o. 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 Patient stated that he was leaving church today when he became " kind of drowsy and with a weak mind", slumped over into his wheelchair, started mumbling, incoherent, and the right leg became weak and shaky. Said that his right arm was fine and did not have HA, vertigo, double vision, difficulty swallowing, or visual impairment. He said that this occurred " only for a short period of time" and by the time he arrived to the ED was back to his baseline. [name] [name] tells me that he manages around the house in his wheelchair because doesn't feel comfortable using his prosthesis or using a walker. Last fall was couple of years ago. CT head was personally reviewed and showed no acute abnormality, but a subsequent MRI brain revealed an acute nonhemorrhagic 8 mm linear infarct involving the left lentiform nucleus. MRA brain with high-grade stenosis or occlusion of the non dominant right vertebral artery just beyond the PICA origin. Moderate irregularity of the dominant left vertebral artery with less than a 50% stenosis. 50-70% stenosis of the mid basilar artery.   Date last known well: 05/29/14 Time last known well: unclear tPA Given: no, symptoms resolved   Past Medical History  Diagnosis Date  . A-fib   . Peripheral arterial disease   . DVT (deep venous thrombosis)     history of prior to amputation  . Cerebral  aneurysm     history of cerebral hemorrhage due to brain aneurysm rupture 25 yrs ago  . DJD (degenerative joint disease)   . Urethral stricture     history of dilated  . Complication of anesthesia     had Atrial Fib.- per patient  . Stroke     patient unaware  . Dysrhythmia     PAF  . History of kidney stones     passed all except 1.  . HOH (hard of hearing)     Past Surgical History  Procedure Laterality Date  . Varicose vein surgery    . Appendectomy    . Prostatectomy    . Tonsillectomy and adenoidectomy    . Above knee leg amputation      left  . Suboccipital craniectomy cervical laminectomy Left 05/13/2013    Procedure: Left SUBOCCIPITAL CRANIECTOMY;  Surgeon: Ophelia Charter, MD;  Location: [facility];  Service: Neurosurgery;  Laterality: Left;  . Cystoscopy      kidney stone  . Hernia repair Left   . Cyst excision      above rectum  . Lumbar wound debridement N/A 06/22/2013    Procedure: incision and drainage of posterior cervical wound;  Surgeon: Ophelia Charter, MD;  Location: [facility];  Service: Neurosurgery;  Laterality: N/A;    Family History  Problem Relation Age of Onset  . Hypertension     Social History:  reports that he has never smoked. He has never used smokeless tobacco. He reports that he does not drink alcohol or use illicit drugs. Family history: no brain tumor, brain aneurysms, or epilepsy Allergies:  Allergies  Allergen Reactions  . Coumadin [Warfarin Sodium] Other (See Comments)    Caused bleeding in brain    Medications:                                                                                                                           Scheduled: .  stroke: mapping our early stages of recovery book   Does not apply Once  . amiodarone  100 mg Oral Daily  . heparin  5,000 Units Subcutaneous 3 times per day    ROS:                                                                                                                                        History obtained from the patient and chart review.  General ROS: negative for - chills, fatigue, fever, night sweats, weight gain or weight loss Psychological ROS: negative for - behavioral disorder, hallucinations, memory difficulties, mood swings or suicidal ideation Ophthalmic ROS: negative for - blurry vision, double vision, eye pain or loss of vision ENT ROS: negative for - epistaxis, nasal discharge, oral lesions, sore throat, tinnitus or vertigo Allergy and Immunology ROS: negative for - hives or itchy/watery eyes Hematological and Lymphatic ROS: negative for - bleeding problems, bruising or swollen lymph nodes Endocrine ROS: negative for - galactorrhea, hair pattern changes, polydipsia/polyuria or temperature intolerance Respiratory ROS: negative for - cough, hemoptysis, shortness of breath or wheezing Cardiovascular ROS: negative for - chest pain, dyspnea on exertion, edema or irregular heartbeat Gastrointestinal ROS: negative for - abdominal pain, diarrhea, hematemesis, nausea/vomiting or stool incontinence Genito-Urinary ROS: negative for - dysuria, hematuria, incontinence or urinary frequency/urgency  Musculoskeletal ROS: negative for - joint swelling or muscular weakness Neurological ROS: as noted in HPI Dermatological ROS: negative for rash and skin lesion changes    Physical exam: pleasant male in no apparent distress. Blood pressure 139/57, pulse 67, temperature 97.6 F (36.4 C), temperature source Oral, resp. rate 20, height 5' 10"  (1.778 m), weight 120.203 kg (265 lb), SpO2 98 %. Head: normocephalic. Neck: supple, no bruits, no JVD. Cardiac: no murmurs. Lungs: clear. Abdomen: soft, no tender, no mass. Extremities: mild edema RLE, s/p left aka Skin: no rash  Neurologic Examination:                                                                                                      General: Mental Status: Alert, oriented, thought content  appropriate.  Speech fluent without evidence of aphasia.  Able to follow 3 step commands without difficulty. Cranial Nerves: II: Discs flat bilaterally; Visual fields grossly normal, pupils equal, round, reactive to light and accommodation III,IV, VI: ptosis not present, extra-ocular motions intact bilaterally V,VII: smile symmetric, facial light touch sensation normal bilaterally VIII: hearing normal bilaterally IX,X: uvula rises symmetrically XI: bilateral shoulder shrug XII: midline tongue extension without atrophy or fasciculations Motor: Right : Upper extremity   5/5    Left:     Upper extremity   5/5  Lower extremity   s/p aka    Lower extremity   5/5 Tone and bulk:normal tone throughout; no atrophy noted Sensory: Pinprick and light touch intact throughout, bilaterally Deep Tendon Reflexes:  1 upper ext, 1 right patella, s/p left aka Plantars: Right: downgoing   Left: s/p aka Cerebellar: normal finger-to-nose, heel-to-shin no tested Gait:  Unable to test due to left aka    Results for orders placed or performed during the hospital encounter of 05/30/14 (from the past 48 hour(s))  CBG monitoring, ED     Status: None   Collection Time: 05/30/14 11:40 AM  Result Value Ref Range   Glucose-Capillary 78 70 - 99 mg/dL  CBC     Status: Abnormal   Collection Time: 05/30/14 11:46 AM  Result Value Ref Range   WBC 6.7 4.0 - 10.5 K/uL   RBC 5.07 4.22 - 5.81 MIL/uL   Hemoglobin 13.9 13.0 - 17.0 g/dL   HCT 43.4 39.0 - 52.0 %   MCV 85.6 78.0 - 100.0 fL   MCH 27.4 26.0 - 34.0 pg   MCHC 32.0 30.0 - 36.0 g/dL   RDW 14.1 11.5 - 15.5 %   Platelets 137 (L) 150 - 400 K/uL  Comprehensive metabolic panel     Status: Abnormal   Collection Time: 05/30/14 11:46 AM  Result Value Ref Range   Sodium 139 135 - 145 mmol/L   Potassium 4.0 3.5 - 5.1 mmol/L   Chloride 103 96 - 112 mmol/L   CO2 26 19 - 32 mmol/L   Glucose, Bld 78 70 - 99 mg/dL   BUN 18 6 - 23 mg/dL   Creatinine, Ser 0.97 0.50 -  1.35 mg/dL   Calcium 9.0 8.4 - 10.5 mg/dL  Total Protein 7.4 6.0 - 8.3 g/dL   Albumin 3.5 3.5 - 5.2 g/dL   AST 27 0 - 37 U/L   ALT 30 0 - 53 U/L   Alkaline Phosphatase 60 39 - 117 U/L   Total Bilirubin 0.6 0.3 - 1.2 mg/dL   GFR calc non Af Amer 73 (L) >90 mL/min   GFR calc Af Amer 84 (L) >90 mL/min    Comment: (NOTE) The eGFR has been calculated using the CKD EPI equation. This calculation has not been validated in all clinical situations. eGFR's persistently <90 mL/min signify possible Chronic Kidney Disease.    Anion gap 10 5 - 15  I-stat troponin, ED (not at [facility])     Status: None   Collection Time: 05/30/14 11:59 AM  Result Value Ref Range   Troponin i, poc 0.00 0.00 - 0.08 ng/mL   Comment 3            Comment: Due to the release kinetics of cTnI, a negative result within the first hours of the onset of symptoms does not rule out myocardial infarction with certainty. If myocardial infarction is still suspected, repeat the test at appropriate intervals.   Urine Drug Screen     Status: None   Collection Time: 05/30/14  1:58 PM  Result Value Ref Range   Opiates NONE DETECTED NONE DETECTED   Cocaine NONE DETECTED NONE DETECTED   Benzodiazepines NONE DETECTED NONE DETECTED   Amphetamines NONE DETECTED NONE DETECTED   Tetrahydrocannabinol NONE DETECTED NONE DETECTED   Barbiturates NONE DETECTED NONE DETECTED    Comment:        DRUG SCREEN FOR MEDICAL PURPOSES ONLY.  IF CONFIRMATION IS NEEDED FOR ANY PURPOSE, NOTIFY LAB WITHIN 5 DAYS.        LOWEST DETECTABLE LIMITS FOR URINE DRUG SCREEN Drug Class       Cutoff (ng/mL) Amphetamine      1000 Barbiturate      200 Benzodiazepine   903 Tricyclics       833 Opiates          300 Cocaine          300 THC              50   Urinalysis, Routine w reflex microscopic     Status: Abnormal   Collection Time: 05/30/14  1:58 PM  Result Value Ref Range   Color, Urine YELLOW YELLOW   APPearance CLOUDY (A) CLEAR   Specific  Gravity, Urine 1.019 1.005 - 1.030   pH 7.0 5.0 - 8.0   Glucose, UA NEGATIVE NEGATIVE mg/dL   Hgb urine dipstick TRACE (A) NEGATIVE   Bilirubin Urine NEGATIVE NEGATIVE   Ketones, ur NEGATIVE NEGATIVE mg/dL   Protein, ur NEGATIVE NEGATIVE mg/dL   Urobilinogen, UA 0.2 0.0 - 1.0 mg/dL   Nitrite POSITIVE (A) NEGATIVE   Leukocytes, UA LARGE (A) NEGATIVE  Urine microscopic-add on     Status: Abnormal   Collection Time: 05/30/14  1:58 PM  Result Value Ref Range   Squamous Epithelial / LPF RARE RARE   WBC, UA 11-20 <3 WBC/hpf   RBC / HPF 0-2 <3 RBC/hpf   Bacteria, UA MANY (A) RARE   Dg Chest 2 View  05/30/2014   CLINICAL DATA:  Pt states he was at church this morning and his daughter said she was unable to understand what he was saying; pt states he had a stroke 1 yr ago and has h/o AFIB; non-smoker  EXAM: CHEST  2 VIEW  COMPARISON:  06/23/2013  FINDINGS: Cardiac silhouette is mildly enlarged. No mediastinal or hilar masses or evidence of adenopathy.  Lungs are clear.  No pleural effusion or pneumothorax.  Bony thorax is demineralized but grossly intact.  IMPRESSION: No acute cardiopulmonary disease.   Electronically Signed   By: Lajean Manes M.D.   On: 05/30/2014 12:57   Ct Head Wo Contrast  05/30/2014   CLINICAL DATA:  Patient with brief a phasic apparent and right leg weakness.  EXAM: CT HEAD WITHOUT CONTRAST  TECHNIQUE: Contiguous axial images were obtained from the base of the skull through the vertex without intravenous contrast.  COMPARISON:  CT head 05/23/2013  FINDINGS: Ventricles and sulci are prominent, compatible with atrophy. Extensive periventricular and subcortical white matter hypodensity compatible with chronic small vessel ischemic changes. Are re- demonstrated left occipital craniectomy. Persistent encephalomalacia within the left cerebellar hemisphere. No evidence for acute cortically based infarct, intracranial hemorrhage, mass lesion or mass-effect. Paranasal sinuses are well  aerated. Mastoid air cells are unremarkable.  IMPRESSION: Chronic small vessel ischemic changes.  No acute intracranial process.   Electronically Signed   By: Lovey Newcomer M.D.   On: 05/30/2014 12:36    Assessment: 81 y.o. 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 Neuro-exam non focal at this time. MRI brain revealed an acute nonhemorrhagic 8 mm linear infarct involving the left lentiform nucleus. Although embolic strokes can be subcortical, I can not exclude the possibility that this acute lacunar infarct being secondary to small vessel disease in which case use of antiplatelet therapy is the best option. Patient with significant risk of cerebral bleeding (prior coumadin related cerebellar hemorrhage 05/2013, remote SAH, lef LE amputee at risk of falling) and thus will suggest no pursuing anticoagulation. Instead, will suggest aspirin after passing swallowing evaluation. Stroke team will follow up tomorrow and make further recommendations.  Stroke Risk Factors - age, atrial fibrillation, prior stroke.  Plan: 1. HgbA1c, fasting lipid panel 2. MRI, MRA  of the brain without contrast (done) 3. Echocardiogram 4. Carotid dopplers 5. Prophylactic therapy-aspirin 6. Risk factor modification   Dorian Pod, MD [facility] [phone]  05/30/2014, 6:12 PM  7. Telemetry monitoring 8. Frequent neuro checks 9. PT/OT SLP

## 2014-05-30 NOTE — ED Notes (Signed)
Pt placed on monitor upon arrival to room. Pt monitored by blood pressure, pulse ox, and 12 lead. pts EKG given to and signed by Dr. Micheline Mazeocherty. Pts CBG 78.

## 2014-05-30 NOTE — ED Notes (Signed)
Per ems, pt was at church, witnessed to have brief aphasic event with right leg weakness, resolved very quickly, arrives at baseline with no weakness or aphasia noted

## 2014-05-31 ENCOUNTER — Telehealth: Payer: Self-pay | Admitting: Cardiology

## 2014-05-31 ENCOUNTER — Inpatient Hospital Stay (HOSPITAL_COMMUNITY): Payer: Medicare Other

## 2014-05-31 DIAGNOSIS — I4891 Unspecified atrial fibrillation: Secondary | ICD-10-CM

## 2014-05-31 DIAGNOSIS — I639 Cerebral infarction, unspecified: Secondary | ICD-10-CM

## 2014-05-31 DIAGNOSIS — E785 Hyperlipidemia, unspecified: Secondary | ICD-10-CM

## 2014-05-31 DIAGNOSIS — G459 Transient cerebral ischemic attack, unspecified: Secondary | ICD-10-CM

## 2014-05-31 LAB — LIPID PANEL
CHOL/HDL RATIO: 5.6 ratio
Cholesterol: 167 mg/dL (ref 0–200)
HDL: 30 mg/dL — ABNORMAL LOW (ref >39)
LDL CALC: 107 mg/dL — AB (ref 0–99)
Triglycerides: 152 mg/dL — ABNORMAL HIGH (ref <150)
VLDL: 30 mg/dL (ref 0–40)

## 2014-05-31 LAB — HEMOGLOBIN A1C
Hgb A1c MFr Bld: 5.8 % — ABNORMAL HIGH (ref 4.8–5.6)
Mean Plasma Glucose: 120 mg/dL

## 2014-05-31 MED ORDER — ASPIRIN 325 MG PO TABS
325.0000 mg | ORAL_TABLET | Freq: Every day | ORAL | Status: DC
  Administered 2014-05-31: 325 mg via ORAL
  Filled 2014-05-31: qty 1

## 2014-05-31 MED ORDER — ASPIRIN 325 MG PO TABS: 325.0000 mg | ORAL_TABLET | Freq: Every day | ORAL | Status: DC

## 2014-05-31 MED ORDER — ATORVASTATIN CALCIUM 20 MG PO TABS
20.0000 mg | ORAL_TABLET | Freq: Every day | ORAL | Status: DC
Start: 2014-05-31 — End: 2014-06-25

## 2014-05-31 NOTE — Procedures (Signed)
History: 81 yo M With stroke  Sedation: None  Technique: This is a 17 channel routine scalp EEG performed at the bedside with bipolar and monopolar montages arranged in accordance to the international 10/20 system of electrode placement. One channel was dedicated to EKG recording.    Background: The background consists predominantly of alpha range activity.There is a well defined posterior dominant rhythm of 8.5 Hz that attenuates with eye opening. There is drowisness as evidenced by anterior shifting of the PDR, but no clear sleep was recorded.   Photic stimulation: Physiologic driving is not performed  EEG Abnormalities: none  Clinical Interpretation: This normal EEG is recorded in the waking and drowsy state. There was no seizure or seizure predisposition recorded on this study.   Ritta SlotMcNeill Eowyn Tabone, MD [facility] [phone]  If 7pm- 7am, please page neurology on call as listed in [facility].

## 2014-05-31 NOTE — Progress Notes (Signed)
CARE MANAGEMENT NOTE 05/31/2014  Patient:  RogerRoger   Account Number:  [id number]  Date Initiated:  05/31/2014  Documentation initiated by:  Jiles CrockerHANDLER,Kadie Balestrieri  Subjective/Objective Assessment:   ADMITTED WITH CVA     Action/Plan:   CM FOLLOWING FOR DCP   Anticipated DC Date:  06/02/2014   Anticipated DC Plan:  AWAITING FOR PT/OT EVALS FOR DISPOSITION NEEDS     DC Planning Services  CM consult        Status of service:  In process, will continue to follow  Per UR Regulation:  Reviewed for med. necessity/level of care/duration of stay  Comments:  4/18/2016[name]- B Liliann File RN,BSN,MHA [phone]

## 2014-05-31 NOTE — Discharge Summary (Signed)
PATIENT DETAILS Name: [name] [name] Age: 81 y.o. Sex: male Date of Birth: May 22, 1926 MRN: [id number]. Admitting Physician: Rhetta Mura, MD WUJ:WJXBJ,YNWGNF ALEXANDER, MD  Admit Date: 05/30/2014 Discharge date: 05/31/2014  Recommendations for Outpatient Follow-up:  1. New medication: Aspirin 325 mg and Lipitor 20 mg  PRIMARY DISCHARGE DIAGNOSIS:  Active Problems:   Acute CVA     PAST MEDICAL HISTORY: Past Medical History  Diagnosis Date  . A-fib   . Peripheral arterial disease   . DVT (deep venous thrombosis)     history of prior to amputation  . Cerebral aneurysm     history of cerebral hemorrhage due to brain aneurysm rupture 25 yrs ago  . DJD (degenerative joint disease)   . Urethral stricture     history of dilated  . Complication of anesthesia     had Atrial Fib.- per patient  . Stroke     patient unaware  . Dysrhythmia     PAF  . History of kidney stones     passed all except 1.  . HOH (hard of hearing)     DISCHARGE MEDICATIONS: Current Discharge Medication List    START taking these medications   Details  aspirin 325 MG tablet Take 1 tablet (325 mg total) by mouth daily. Qty: 30 tablet, Refills: 0    atorvastatin (LIPITOR) 20 MG tablet Take 1 tablet (20 mg total) by mouth daily. Qty: 30 tablet, Refills: 0      CONTINUE these medications which have NOT CHANGED   Details  amiodarone (PACERONE) 200 MG tablet Take 0.5 tablets (100 mg total) by mouth daily. Qty: 30 tablet, Refills: 6    Diaper Rash Products (DESITIN) OINT Apply 1 application topically daily as needed (Breakouts on ankle).     Multiple Vitamin (MULTIVITAMIN WITH MINERALS) TABS tablet Take 1 tablet by mouth every evening. [employer]        ALLERGIES:   Allergies  Allergen Reactions  . Coumadin [Warfarin Sodium] Other (See Comments)    Caused bleeding in brain    BRIEF HPI:  See H&P, Labs, Consult and Test reports for all details in brief, patient is a 81 year old  male with past medical history of atrial fibrillation and not on anticoagulation, history of left cerebellar hemorrhage admitted for evaluation of slurred speech and right leg weakness. CT head was negative for acute abnormalities, however MRI brain revealed a acute nonhemorrhagic left leg to home nucleus infarct.  CONSULTATIONS:   neurology  PERTINENT RADIOLOGIC STUDIES: Dg Chest 2 View  05/30/2014   CLINICAL DATA:  Pt states he was at church this morning and his daughter said she was unable to understand what he was saying; pt states he had a stroke 1 yr ago and has h/o AFIB; non-smoker  EXAM: CHEST  2 VIEW  COMPARISON:  06/23/2013  FINDINGS: Cardiac silhouette is mildly enlarged. No mediastinal or hilar masses or evidence of adenopathy.  Lungs are clear.  No pleural effusion or pneumothorax.  Bony thorax is demineralized but grossly intact.  IMPRESSION: No acute cardiopulmonary disease.   Electronically Signed   By: Amie Portland M.D.   On: 05/30/2014 12:57   Ct Head Wo Contrast  05/30/2014   CLINICAL DATA:  Patient with brief a phasic apparent and right leg weakness.  EXAM: CT HEAD WITHOUT CONTRAST  TECHNIQUE: Contiguous axial images were obtained from the base of the skull through the vertex without intravenous contrast.  COMPARISON:  CT head 05/23/2013  FINDINGS: Ventricles and sulci  are prominent, compatible with atrophy. Extensive periventricular and subcortical white matter hypodensity compatible with chronic small vessel ischemic changes. Are re- demonstrated left occipital craniectomy. Persistent encephalomalacia within the left cerebellar hemisphere. No evidence for acute cortically based infarct, intracranial hemorrhage, mass lesion or mass-effect. Paranasal sinuses are well aerated. Mastoid air cells are unremarkable.  IMPRESSION: Chronic small vessel ischemic changes.  No acute intracranial process.   Electronically Signed   By: Annia Belt M.D.   On: 05/30/2014 12:36   Mri Brain Without  Contrast  05/30/2014   CLINICAL DATA:  TIA speech disturbance lasting 2-3 minutes today. Shaking in jerking episodes without incontinence. Patient was fatigued yesterday.  EXAM: MRI HEAD WITHOUT CONTRAST  MRA HEAD WITHOUT CONTRAST  TECHNIQUE: Multiplanar, multiecho pulse sequences of the brain and surrounding structures were obtained without intravenous contrast. Angiographic images of the head were obtained using MRA technique without contrast.  COMPARISON:  CT head from the same day.  FINDINGS: MRI HEAD FINDINGS  An acute nonhemorrhagic 8 mm linear infarct is present within the left lentiform nucleus. The remote hemorrhagic infarct and craniotomy of the left cerebellum are again noted. There additional scratch the there is a punctate area of remote hemorrhage in the right cerebellum. Other lacunar scratch the other remote lacunar infarcts are present in the basal ganglia bilaterally. Advanced atrophy and moderate white matter changes are seen bilaterally. Dilated perivascular spaces are noted within the basal ganglia bilaterally as well.  Flow is present in the major intracranial arteries. The globes and orbits are intact. The paranasal sinuses and mastoid air cells are clear.  MRA HEAD FINDINGS  The internal carotid arteries are within normal limits from the high cervical segments through the ICA termini bilaterally. The A1 and M1 segments are normal. The anterior communicating artery is patent. The MCA bifurcations are intact. There is moderate attenuation of distal MCA branch vessels bilaterally.  There is signal loss in the distal left vertebral artery scratch the there signal loss the distal right vertebral artery beyond the PICA. Moderate irregularity is present in the dominant left vertebral artery. The left PICA origin is visualized. There is moderate attenuation of the mid basilar artery or a 50- 70% stenosis. The posterior cerebral arteries are of fetal type bilaterally. The right P1 segment is present  but with a significant stenosis. There is moderate attenuation of distal PCA branch vessels.  IMPRESSION: 1. Acute nonhemorrhagic 8 mm linear infarct involving the left lentiform nucleus. 2. Other remote lacunar infarcts are present in the basal ganglia bilaterally. 3. Remote hemorrhagic infarct of the left cerebellum. The craniotomy is again noted. 4. Advanced atrophy and diffuse white matter disease reflects the sequela of chronic microvascular ischemia. 5. High-grade stenosis or occlusion of the non dominant right vertebral artery just beyond the PICA origin. 6. Moderate irregularity of the dominant left vertebral artery with less than a 50% stenosis. 7. 50-70% stenosis of the mid basilar artery. Fetal type posterior cerebral arteries are present bilaterally.   Electronically Signed   By: Marin Roberts M.D.   On: 05/30/2014 18:42   Mr Maxine Glenn Head/brain Wo Cm  05/30/2014   CLINICAL DATA:  TIA speech disturbance lasting 2-3 minutes today. Shaking in jerking episodes without incontinence. Patient was fatigued yesterday.  EXAM: MRI HEAD WITHOUT CONTRAST  MRA HEAD WITHOUT CONTRAST  TECHNIQUE: Multiplanar, multiecho pulse sequences of the brain and surrounding structures were obtained without intravenous contrast. Angiographic images of the head were obtained using MRA technique without contrast.  COMPARISON:  CT head from the same day.  FINDINGS: MRI HEAD FINDINGS  An acute nonhemorrhagic 8 mm linear infarct is present within the left lentiform nucleus. The remote hemorrhagic infarct and craniotomy of the left cerebellum are again noted. There additional scratch the there is a punctate area of remote hemorrhage in the right cerebellum. Other lacunar scratch the other remote lacunar infarcts are present in the basal ganglia bilaterally. Advanced atrophy and moderate white matter changes are seen bilaterally. Dilated perivascular spaces are noted within the basal ganglia bilaterally as well.  Flow is present in  the major intracranial arteries. The globes and orbits are intact. The paranasal sinuses and mastoid air cells are clear.  MRA HEAD FINDINGS  The internal carotid arteries are within normal limits from the high cervical segments through the ICA termini bilaterally. The A1 and M1 segments are normal. The anterior communicating artery is patent. The MCA bifurcations are intact. There is moderate attenuation of distal MCA branch vessels bilaterally.  There is signal loss in the distal left vertebral artery scratch the there signal loss the distal right vertebral artery beyond the PICA. Moderate irregularity is present in the dominant left vertebral artery. The left PICA origin is visualized. There is moderate attenuation of the mid basilar artery or a 50- 70% stenosis. The posterior cerebral arteries are of fetal type bilaterally. The right P1 segment is present but with a significant stenosis. There is moderate attenuation of distal PCA branch vessels.  IMPRESSION: 1. Acute nonhemorrhagic 8 mm linear infarct involving the left lentiform nucleus. 2. Other remote lacunar infarcts are present in the basal ganglia bilaterally. 3. Remote hemorrhagic infarct of the left cerebellum. The craniotomy is again noted. 4. Advanced atrophy and diffuse white matter disease reflects the sequela of chronic microvascular ischemia. 5. High-grade stenosis or occlusion of the non dominant right vertebral artery just beyond the PICA origin. 6. Moderate irregularity of the dominant left vertebral artery with less than a 50% stenosis. 7. 50-70% stenosis of the mid basilar artery. Fetal type posterior cerebral arteries are present bilaterally.   Electronically Signed   By: Marin Robertshristopher  Mattern M.D.   On: 05/30/2014 18:42     PERTINENT LAB RESULTS: CBC:  Recent Labs  05/30/14 1146  WBC 6.7  HGB 13.9  HCT 43.4  PLT 137*   CMET CMP     Component Value Date/Time   NA 139 05/30/2014 1146   K 4.0 05/30/2014 1146   CL 103  05/30/2014 1146   CO2 26 05/30/2014 1146   GLUCOSE 78 05/30/2014 1146   BUN 18 05/30/2014 1146   CREATININE 0.97 05/30/2014 1146   CALCIUM 9.0 05/30/2014 1146   PROT 7.4 05/30/2014 1146   ALBUMIN 3.5 05/30/2014 1146   AST 27 05/30/2014 1146   ALT 30 05/30/2014 1146   ALKPHOS 60 05/30/2014 1146   BILITOT 0.6 05/30/2014 1146   GFRNONAA 73* 05/30/2014 1146   GFRAA 84* 05/30/2014 1146    GFR Estimated Creatinine Clearance: 71.1 mL/min (by C-G formula based on Cr of 0.97). No results for input(s): LIPASE, AMYLASE in the last 72 hours. No results for input(s): CKTOTAL, CKMB, CKMBINDEX, TROPONINI in the last 72 hours. Invalid input(s): POCBNP No results for input(s): DDIMER in the last 72 hours.  Recent Labs  05/30/14 1530  HGBA1C 5.8*    Recent Labs  05/31/14 0629  CHOL 167  HDL 30*  LDLCALC 107*  TRIG 152*  CHOLHDL 5.6   No results for input(s): TSH, T4TOTAL, T3FREE, THYROIDAB in  the last 72 hours.  Invalid input(s): FREET3 No results for input(s): VITAMINB12, FOLATE, FERRITIN, TIBC, IRON, RETICCTPCT in the last 72 hours. Coags: No results for input(s): INR in the last 72 hours.  Invalid input(s): PT Microbiology: No results found for this or any previous visit (from the past 240 hour(s)).   BRIEF HOSPITAL COURSE:   Acute CVA: Patient was admitted with slurred speech and right leg weakness and transient unresponsiveness. Further workup including a MRI of the brain demonstrated a acute left lentiform nucleus infarct. Unfortunately patient has had a prior cerebellar bleeding from anticoagulation, and has a history of atrial fibrillation-reviewed outpatient cardiology note-patient is no longer a candidate for continued anticoagulation. Patient was admitted and underwent echocardiogram which showed EF around 60%, no embolic source was identified. Subsequently underwent carotid Doppler which was negative for stenosis. Patient was evaluated by neurology, recommendations were  to start on aspirin, and avoid anticoagulation given his prior history of hemorrhagic infarct. I have discussed this difficult situation with both the patient and the patient's daughter and both of them understand is difficult situation with anticoagulation.  Dyslipidemia:continue with statin  Atrial fibrillation: Continue amiodarone, unfortunately not a candidate for anticoagulation given history of cerebellar hemorrhage one year back.Chads2vasc score is around 5.  TODAY-DAY OF DISCHARGE:  Subjective:   Artha Markell today has no headache,no chest abdominal pain,no new weakness tingling or numbness, feels much better wants to go home today.  Objective:   Blood pressure 116/55, pulse 76, temperature 97.9 F (36.6 C), temperature source Oral, resp. rate 20, height  (1.778 m), weight 120.203 kg (265 lb), SpO2 96 %.  Intake/Output Summary (Last 24 hours) at 05/31/14 1436 Last data filed at 05/31/14 1318  Gross per 24 hour  Intake    720 ml  Output    800 ml  Net    -80 ml   Filed Weights   05/30/14 1142  Weight: 120.203 kg (265 lb)    Exam Awake Alert, Oriented *3, No new F.N deficits, Normal affect [place].AT,PERRAL Supple Neck,No JVD, No cervical lymphadenopathy appriciated.  Symmetrical Chest wall movement, Good air movement bilaterally, CTAB RRR,No Gallops,Rubs or new Murmurs, No Parasternal Heave +ve B.Sounds, Abd Soft, Non tender, No organomegaly appriciated, No rebound -guarding or rigidity. No Cyanosis, Clubbing or edema, No new Rash or bruise  DISCHARGE CONDITION: Stable  DISPOSITION: Home  DISCHARGE INSTRUCTIONS:    Activity:  As tolerated  Diet recommendation: Heart Healthy diet  Discharge Instructions    Call MD for:  extreme fatigue    Complete by:  As directed      Call MD for:  persistant dizziness or light-headedness    Complete by:  As directed      Diet - low sodium heart healthy    Complete by:  As directed      Increase activity slowly     Complete by:  As directed            Follow-up Information    Please follow up.   Why:  appt. with Dr.Vivian Cherre Huger 06/08/14 12:15PM,Dr. Elias Else out of office      Follow up with SETHI,PRAMOD, MD. Schedule an appointment as soon as possible for a visit in 2 months.   Specialties:  Neurology, Radiology   Contact information:   [address] [place] [address] [phone]         Total Time spent on discharge equals 45 minutes.  SignedJeoffrey Massed 05/31/2014 2:36 PM

## 2014-05-31 NOTE — Progress Notes (Addendum)
STROKE TEAM PROGRESS NOTE   HISTORY [name] [name] is an 81 y.o. male with a past medical history significant for atrial fibrillation of anticoagulants, coumadin related left cerebellar hemorrhage 05/2013 s/p decompressive craniectomy, remote SAH presumably due to rupture aneurysm that was never identified, PAD, DVT, brought in for further evaluation of right leg weakness, slurred speech, decreased responsiveness. Patient stated that he was leaving church today when he became " kind of drowsy and with a weak mind", slumped over into his wheelchair, started mumbling, incoherent, and the right leg became weak and shaky. (LKW 05/30/2014, time unclear). Said that his right arm was fine and did not have HA, vertigo, double vision, difficulty swallowing, or visual impairment. He said that this occurred " only for a short period of time" and by the time he arrived to the ED was back to his baseline. [name] [name] tells me that he manages around the house in his wheelchair because doesn't feel comfortable using his prosthesis or using a walker. Last fall was couple of years ago.   CT head showed no acute abnormality, but a subsequent MRI brain revealed an acute nonhemorrhagic 8 mm linear infarct involving the left lentiform nucleus.   MRA brain with high-grade stenosis or occlusion of the non dominant right vertebral artery just beyond the PICA origin. Moderate irregularity of the dominant left vertebral artery with less than a 50% stenosis. 50-70% stenosis of the mid basilar artery.   Patient was not administered TPA secondary to symptoms resolved. He was admitted for further evaluation and treatment.   SUBJECTIVE (INTERVAL HISTORY) His RN is at the bedside, no family present.  Overall he feels his condition is stable.    OBJECTIVE Temp:  [97.6 F (36.4 C)-98.2 F (36.8 C)] 98.2 F (36.8 C) (04/18 0703) Pulse Rate:  [67-76] 68 (04/18 0703) Cardiac Rhythm:  [-] Normal sinus rhythm (04/17 2030) Resp:  [15-20]  20 (04/18 0703) BP: (107-155)/(46-73) 152/63 mmHg (04/18 0703) SpO2:  [95 %-100 %] 98 % (04/18 0703) Weight:  [120.203 kg (265 lb)] 120.203 kg (265 lb) (04/17 1142)   Recent Labs Lab 05/30/14 1140  GLUCAP 78    Recent Labs Lab 05/30/14 1146  NA 139  K 4.0  CL 103  CO2 26  GLUCOSE 78  BUN 18  CREATININE 0.97  CALCIUM 9.0    Recent Labs Lab 05/30/14 1146  AST 27  ALT 30  ALKPHOS 60  BILITOT 0.6  PROT 7.4  ALBUMIN 3.5    Recent Labs Lab 05/30/14 1146  WBC 6.7  HGB 13.9  HCT 43.4  MCV 85.6  PLT 137*   No results for input(s): CKTOTAL, CKMB, CKMBINDEX, TROPONINI in the last 168 hours. No results for input(s): LABPROT, INR in the last 72 hours.  Recent Labs  05/30/14 1358  COLORURINE YELLOW  LABSPEC 1.019  PHURINE 7.0  GLUCOSEU NEGATIVE  HGBUR TRACE*  BILIRUBINUR NEGATIVE  KETONESUR NEGATIVE  PROTEINUR NEGATIVE  UROBILINOGEN 0.2  NITRITE POSITIVE*  LEUKOCYTESUR LARGE*       Component Value Date/Time   CHOL 167 05/31/2014 0629   TRIG 152* 05/31/2014 0629   HDL 30* 05/31/2014 0629   CHOLHDL 5.6 05/31/2014 0629   VLDL 30 05/31/2014 0629   LDLCALC 107* 05/31/2014 0629   Lab Results  Component Value Date   HGBA1C  09/29/2008    5.9 (NOTE) The ADA recommends the following therapeutic goal for glycemic control related to Hgb A1c measurement: Goal of therapy: <6.5 Hgb A1c  Reference: American Diabetes  Association: Clinical Practice Recommendations 2010, Diabetes Care, 2010, 33: (Suppl  1).      Component Value Date/Time   LABOPIA NONE DETECTED 05/30/2014 1358   COCAINSCRNUR NONE DETECTED 05/30/2014 1358   LABBENZ NONE DETECTED 05/30/2014 1358   AMPHETMU NONE DETECTED 05/30/2014 1358   THCU NONE DETECTED 05/30/2014 1358   LABBARB NONE DETECTED 05/30/2014 1358    No results for input(s): ETH in the last 168 hours.   Dg Chest 2 View 05/30/2014    No acute cardiopulmonary disease.     Ct Head Wo Contrast 05/30/2014    Chronic small vessel  ischemic changes.  No acute intracranial process.     Mri Brain Without Contrast 05/30/2014   1. Acute nonhemorrhagic 8 mm linear infarct involving the left lentiform nucleus. 2. Other remote lacunar infarcts are present in the basal ganglia bilaterally. 3. Remote hemorrhagic infarct of the left cerebellum. The craniotomy is again noted. 4. Advanced atrophy and diffuse white matter disease reflects the sequela of chronic microvascular ischemia.   [name] [name] Head/brain Wo Cm 05/30/2014   5. High-grade stenosis or occlusion of the non dominant right vertebral artery just beyond the PICA origin. 6. Moderate irregularity of the dominant left vertebral artery with less than a 50% stenosis. 7. 50-70% stenosis of the mid basilar artery. Fetal type posterior cerebral arteries are present bilaterally.     Carotid Doppler  There is 1-39% bilateral ICA stenosis. Vertebral artery flow is antegrade.     PHYSICAL EXAM: obese elderly Caucasian male currently not in distress. . Afebrile. Head is nontraumatic. Neck is supple without bruit.    Cardiac exam no murmur or gallop. Lungs are clear to auscultation. Distal pulses are well felt on the right and left leg has a below-knee amputation. Neurological Exam ;  Awake  Alert oriented x 3. Normal speech and language.eye movements full without nystagmus.fundi were not visualized. Vision acuity and fields appear normal. Hearing is normal. Palatal movements are normal. Face symmetric. Tongue midline. Normal strength, tone, reflexes and coordination in upper extremities and on the right and limited in left lower extremity due to a below-knee amputation. Normal sensation. Gait deferred. ASSESSMENT/PLAN [name] [name] is a 81 y.o. male with history of atrial fibrillation off anticoagulants d/t coumadin related left cerebellar hemorrhage 05/2013 s/p decompressive craniectomy, remote SAH presumably due to rupture aneurysm that was never identified, PAD and DVT presenting with right  leg weakness, slurred speech, decreased responsiveness. He did not receive IV t-PA due to resolved symptoms, hx ICH.   Stroke:  Dominant left lentiform nucleus infarct embolic secondary to known atrial fibrillation   MRI  Acute L lentiform nucleus infarct. Old B BG & L cerebellar lacunes  MRA  R VA high grade stenosis, L VA <50%, mid basilar 50-70% stenosis  Carotid Doppler  No significant stenosis   2D Echo  Done, results pending   EEG done. Results pending   HgbA1c pending  Heparin 5000 units sq tid for VTE prophylaxis   currently without diet  no antithrombotic prior to admission, now on aspirin 325 mg orally every day. Do not recommend resumption of anticoagulation  Patient counseled to be compliant with his antithrombotic medications  Ongoing aggressive stroke risk factor management  Therapy recommendations:  pending   Disposition:  Anticipate return home with family (lives w/ dtr in joint family household x 16 years, uses w/c)  Atrial Fibrillation  Off anticoagulation d/t coumadin related L cerebellar hemorrhage 05/2013 with INR 2.4. Given hemorrhage on therapeutic  level, do not recommend resumption of anticoagulation   Hyperlipidemia  Home meds:  No statin  LDL 107, goal < 70  Add statin  Continue statin at discharge  Other Stroke Risk Factors  Advanced age  Obesity, Body mass index is 38.02 kg/(m^2).   Remote DVT  Other Pertinent History  L AKA 09/2008 PAD.  Hospital day # 1  [facility] See Amion for Pager information 05/31/2014 11:33 AM  I have personally examined this patient, reviewed notes, independently viewed imaging studies, participated in medical decision making and plan of care. I have made any additions or clarifications directly to the above note. Agree with note above. He has presented with transient symptoms of slurred speech which have resolved but MRI scan does confirm a small left basal ganglia infarct. Even  though the etiology of the current stroke may be small vessel disease he has history of atrial fibrillation but he is not a good long-term anticoagulation candidate given history of intracerebral hemorrhage requiring craniotomy while on therapeutic anticoagulation with warfarin. Hence we'll recommend continue aspirin. He does remain at increased risk for recurrent stroke/TIA in the neurological worsening and needs ongoing stroke evaluation and aggressive risk factor control. He is ok for discharge home; he has no assessed therapy needs.  Delia Heady, MD Medical Director [facility] Pager: [phone] 05/31/2014 1:51 PM    To contact Stroke Continuity provider, please refer to [email or web address]. After hours, contact General Neurology

## 2014-05-31 NOTE — Telephone Encounter (Signed)
Returned call to patient's daughter Dr.Jordan advised does not need lab work.

## 2014-05-31 NOTE — Progress Notes (Signed)
EEG Completed; Results Pending  

## 2014-05-31 NOTE — Progress Notes (Signed)
Discharge instructions reviewed with aptient/family. All questions answered at this time. RXs given. Awaiting for transport.  Sim BoastHavy, RN

## 2014-05-31 NOTE — Progress Notes (Signed)
  Echocardiogram 2D Echocardiogram has been performed.  [name] [name] 05/31/2014, 10:05 AM

## 2014-05-31 NOTE — Telephone Encounter (Signed)
New message        Pt daughter calling to see if pt needs any labs that were not ordered at the hospital

## 2014-05-31 NOTE — Progress Notes (Signed)
VASCULAR LAB PRELIMINARY  PRELIMINARY  PRELIMINARY  PRELIMINARY  Carotid Dopplers completed.    Preliminary report:  1-39% ICA stenosis.  Vertebral artery flow is antegrade.   Gizel Riedlinger, RVT 05/31/2014, 9:13 AM

## 2014-06-08 ENCOUNTER — Encounter: Payer: Self-pay | Admitting: Cardiology

## 2014-06-08 ENCOUNTER — Ambulatory Visit (INDEPENDENT_AMBULATORY_CARE_PROVIDER_SITE_OTHER): Payer: Medicare Other | Admitting: Cardiology

## 2014-06-08 VITALS — BP 130/78 | HR 74

## 2014-06-08 DIAGNOSIS — I639 Cerebral infarction, unspecified: Secondary | ICD-10-CM | POA: Insufficient documentation

## 2014-06-08 DIAGNOSIS — I480 Paroxysmal atrial fibrillation: Secondary | ICD-10-CM

## 2014-06-08 DIAGNOSIS — G459 Transient cerebral ischemic attack, unspecified: Secondary | ICD-10-CM

## 2014-06-08 DIAGNOSIS — I739 Peripheral vascular disease, unspecified: Secondary | ICD-10-CM

## 2014-06-08 NOTE — Progress Notes (Signed)
[name] [name] Date of Birth: [birth year] Medical Record #[id number]  History of Present Illness: [name] [name] is seen today for follow up CVA.  He was admitted 4/1-4/11/15 after he presented with acute disequilibrium. 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 His coumadin was stopped.  He has atrial fib and has been on chronic amiodarone. Other issues include PAD, DVT, remote cerebral aneurysm, DJD, and past amputations of the left leg.  He was admitted again on 4/17-4/18/16 with an acute CVA. Patient was admitted with slurred speech and right leg weakness and transient unresponsiveness. Further workup including a MRI of the brain demonstrated a acute left lentiform nucleus infarct. Unfortunately patient has had a prior cerebellar bleeding from anticoagulation, and has a history of atrial fibrillation-reviewed outpatient cardiology note-patient is no longer a candidate for continued anticoagulation. Patient was admitted and underwent echocardiogram which showed EF around 60%, no embolic source was identified. Subsequently underwent carotid Doppler which was negative for stenosis. Patient was evaluated by neurology, recommendations were to start on aspirin, and avoid anticoagulation given his prior history of hemorrhagic infarct.  On follow up today he reports he is doing well. He doesn't note any residual effects of his CVA. No weakness, numbness. Feels good. No chest pain, palpitations, or SOB.          Current Outpatient Prescriptions on File Prior to Visit  Medication Sig Dispense Refill  . amiodarone (PACERONE) 200 MG tablet Take 0.5 tablets (100 mg total) by mouth daily. 30 tablet 6  . aspirin 325 MG tablet Take 1 tablet (325 mg total) by mouth daily. 30 tablet 0  . atorvastatin (LIPITOR) 20 MG tablet Take 1 tablet (20 mg total) by mouth daily. 30 tablet 0  . Diaper Rash Products (DESITIN) OINT Apply 1 application topically daily as needed (Breakouts on ankle).     . Multiple Vitamin  (MULTIVITAMIN WITH MINERALS) TABS tablet Take 1 tablet by mouth every evening. [employer]     No current facility-administered medications on file prior to visit.    Allergies  Allergen Reactions  . Coumadin [Warfarin Sodium] Other (See Comments)    Caused bleeding in brain    Past Medical History  Diagnosis Date  . A-fib   . Peripheral arterial disease   . DVT (deep venous thrombosis)     history of prior to amputation  . Cerebral aneurysm     history of cerebral hemorrhage due to brain aneurysm rupture 25 yrs ago  . DJD (degenerative joint disease)   . Urethral stricture     history of dilated  . Complication of anesthesia     had Atrial Fib.- per patient  . Stroke     patient unaware  . Dysrhythmia     PAF  . History of kidney stones     passed all except 1.  . HOH (hard of hearing)     Past Surgical History  Procedure Laterality Date  . Varicose vein surgery    . Appendectomy    . Prostatectomy    . Tonsillectomy and adenoidectomy    . Above knee leg amputation      left  . Suboccipital craniectomy cervical laminectomy Left 05/13/2013    Procedure: Left SUBOCCIPITAL CRANIECTOMY;  Surgeon: Cristi Loron, MD;  Location: [facility];  Service: Neurosurgery;  Laterality: Left;  . Cystoscopy      kidney stone  . Hernia repair Left   . Cyst excision      above rectum  .  Lumbar wound debridement N/A 06/22/2013    Procedure: incision and drainage of posterior cervical wound;  Surgeon: [name], MD;  Location: [facility];  Service: Neurosurgery;  Laterality: N/A;    History  Smoking status  . Never Smoker   Smokeless tobacco  . Never Used    History  Alcohol Use No    Family History  Problem Relation Age of Onset  . Hypertension      Review of Systems: The review of systems is per the HPI.  All other systems were reviewed and are negative.  Physical Exam: There were no vitals taken for this visit.  He is seen in a wheelchair.   Patient is  pleasant and in no acute distress. Skin is warm and dry. Color is normal.  HEENT is unremarkable. Normocephalic/atraumatic. PERRL. Sclera are nonicteric.  No JVD. Lungs are clear. Cardiac exam shows a regular rhythm. Abdomen is soft. Extremities are without edema. Gait is not tested. Left AKA. ROM appears intact. He is alert and oriented x 3.   LABORATORY DATA:    Lab Results  Component Value Date   WBC 6.7 05/30/2014   HGB 13.9 05/30/2014   HCT 43.4 05/30/2014   PLT 137* 05/30/2014   GLUCOSE 78 05/30/2014   CHOL 167 05/31/2014   TRIG 152* 05/31/2014   HDL 30* 05/31/2014   LDLCALC 107* 05/31/2014   ALT 30 05/30/2014   AST 27 05/30/2014   NA 139 05/30/2014   K 4.0 05/30/2014   CL 103 05/30/2014   CREATININE 0.97 05/30/2014   BUN 18 05/30/2014   CO2 26 05/30/2014   TSH 2.742 12/07/2013   INR 1.29 05/16/2013   HGBA1C 5.8* 05/30/2014     Assessment / Plan:  1. PAF -  maintaining sinus rhythm on amiodarone.  Not a candidate for anticoagulation due to history of cerebral bleed. LFTs in hospital normal. Will check TSH in 3 months.  2. 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 S/p surgical evacuation. Not a candidate for anticoagulation.  3. Recent CVA -not a candidate for anticoagulation. No residual. Continue ASA.   4. Hypercholesterolemia- now on lipitor. Will check fasting lab work in 3 months.  I will follow up in 6 months.   I will follow up in 6 months.

## 2014-06-08 NOTE — Patient Instructions (Signed)
We will schedule you for lab work in 3 months.  Continue your current medication.  I will see you in 6 months.

## 2014-06-25 ENCOUNTER — Other Ambulatory Visit: Payer: Self-pay | Admitting: *Deleted

## 2014-06-25 MED ORDER — ATORVASTATIN CALCIUM 20 MG PO TABS: 20.0000 mg | ORAL_TABLET | Freq: Every day | ORAL | Status: DC

## 2014-06-25 NOTE — Telephone Encounter (Signed)
Rx refill sent to patient pharmacy   

## 2014-08-19 ENCOUNTER — Ambulatory Visit: Payer: BLUE CROSS/BLUE SHIELD | Admitting: Neurology

## 2014-08-27 ENCOUNTER — Ambulatory Visit: Payer: Medicare Other | Admitting: Podiatry

## 2015-03-29 ENCOUNTER — Other Ambulatory Visit: Payer: Self-pay | Admitting: Cardiology

## 2015-03-29 MED ORDER — AMIODARONE HCL 200 MG PO TABS: 100.0000 mg | ORAL_TABLET | Freq: Every day | ORAL | Status: DC

## 2016-11-06 DIAGNOSIS — H353111 Nonexudative age-related macular degeneration, right eye, early dry stage: Secondary | ICD-10-CM | POA: Diagnosis not present

## 2016-11-06 DIAGNOSIS — H3530 Unspecified macular degeneration: Secondary | ICD-10-CM | POA: Diagnosis not present

## 2017-01-17 DIAGNOSIS — E7800 Pure hypercholesterolemia, unspecified: Secondary | ICD-10-CM | POA: Diagnosis not present

## 2017-01-17 DIAGNOSIS — I10 Essential (primary) hypertension: Secondary | ICD-10-CM | POA: Diagnosis not present

## 2017-01-17 DIAGNOSIS — Z89612 Acquired absence of left leg above knee: Secondary | ICD-10-CM | POA: Diagnosis not present

## 2017-01-17 DIAGNOSIS — Z1389 Encounter for screening for other disorder: Secondary | ICD-10-CM | POA: Diagnosis not present

## 2017-01-17 DIAGNOSIS — I480 Paroxysmal atrial fibrillation: Secondary | ICD-10-CM | POA: Diagnosis not present

## 2017-01-17 DIAGNOSIS — D696 Thrombocytopenia, unspecified: Secondary | ICD-10-CM | POA: Diagnosis not present

## 2017-01-17 DIAGNOSIS — Z23 Encounter for immunization: Secondary | ICD-10-CM | POA: Diagnosis not present

## 2017-01-17 DIAGNOSIS — I739 Peripheral vascular disease, unspecified: Secondary | ICD-10-CM | POA: Diagnosis not present

## 2017-01-17 DIAGNOSIS — Z0000 Encounter for general adult medical examination without abnormal findings: Secondary | ICD-10-CM | POA: Diagnosis not present

## 2017-11-18 DIAGNOSIS — H353111 Nonexudative age-related macular degeneration, right eye, early dry stage: Secondary | ICD-10-CM | POA: Diagnosis not present

## 2018-01-23 DIAGNOSIS — Z89612 Acquired absence of left leg above knee: Secondary | ICD-10-CM | POA: Diagnosis not present

## 2018-01-23 DIAGNOSIS — Z1389 Encounter for screening for other disorder: Secondary | ICD-10-CM | POA: Diagnosis not present

## 2018-01-23 DIAGNOSIS — I739 Peripheral vascular disease, unspecified: Secondary | ICD-10-CM | POA: Diagnosis not present

## 2018-01-23 DIAGNOSIS — E039 Hypothyroidism, unspecified: Secondary | ICD-10-CM | POA: Diagnosis not present

## 2018-01-23 DIAGNOSIS — I480 Paroxysmal atrial fibrillation: Secondary | ICD-10-CM | POA: Diagnosis not present

## 2018-01-23 DIAGNOSIS — Z23 Encounter for immunization: Secondary | ICD-10-CM | POA: Diagnosis not present

## 2018-01-23 DIAGNOSIS — Z0000 Encounter for general adult medical examination without abnormal findings: Secondary | ICD-10-CM | POA: Diagnosis not present

## 2018-01-23 DIAGNOSIS — E7800 Pure hypercholesterolemia, unspecified: Secondary | ICD-10-CM | POA: Diagnosis not present

## 2018-01-23 DIAGNOSIS — I10 Essential (primary) hypertension: Secondary | ICD-10-CM | POA: Diagnosis not present

## 2018-07-03 DIAGNOSIS — E7800 Pure hypercholesterolemia, unspecified: Secondary | ICD-10-CM | POA: Diagnosis not present

## 2018-11-20 DIAGNOSIS — H353111 Nonexudative age-related macular degeneration, right eye, early dry stage: Secondary | ICD-10-CM | POA: Diagnosis not present

## 2018-12-26 ENCOUNTER — Other Ambulatory Visit: Payer: Self-pay

## 2018-12-26 ENCOUNTER — Ambulatory Visit: Payer: Medicare Other | Admitting: Podiatry

## 2018-12-26 ENCOUNTER — Encounter: Payer: Self-pay | Admitting: Podiatry

## 2018-12-26 DIAGNOSIS — B351 Tinea unguium: Secondary | ICD-10-CM | POA: Insufficient documentation

## 2018-12-26 DIAGNOSIS — I739 Peripheral vascular disease, unspecified: Secondary | ICD-10-CM | POA: Diagnosis not present

## 2018-12-26 DIAGNOSIS — M79675 Pain in left toe(s): Secondary | ICD-10-CM | POA: Diagnosis not present

## 2018-12-26 DIAGNOSIS — Z89612 Acquired absence of left leg above knee: Secondary | ICD-10-CM | POA: Diagnosis not present

## 2018-12-26 DIAGNOSIS — D689 Coagulation defect, unspecified: Secondary | ICD-10-CM | POA: Insufficient documentation

## 2018-12-26 DIAGNOSIS — E1159 Type 2 diabetes mellitus with other circulatory complications: Secondary | ICD-10-CM | POA: Diagnosis not present

## 2018-12-26 DIAGNOSIS — M79674 Pain in right toe(s): Secondary | ICD-10-CM

## 2018-12-26 NOTE — Progress Notes (Signed)
This patient presents the office with chief complaint of long thick painful nails on his right foot.  Patient states nails are painful wearing his shoes.  His nails have not been worked on for over a year.  Patient presents the office in a wheelchair with his daughter.  Patient has a history of a left leg amputation in 2010.  He presents the office today for an evaluation and treatment of his long thick painful nails right foot.  General Appearance  Alert, conversant and in no acute stress.  Vascular  Dorsalis pedis and posterior tibial  pulses are absent right foot.  Capillary return is diminished right.  . Temperature is diminished right.  Neurologic  deferred  Nails Thick disfigured discolored nails with subungual debris  from hallux to fifth toes right foot.  . No evidence of bacterial infection or drainage bilaterally.  Orthopedic  No limitations of motion  feet .  No crepitus or effusions noted.  No bony pathology or digital deformities noted.  Amputation left leg.  Skin  normotropic skin with no porokeratosis noted bilaterally.  No signs of infections or ulcers noted.    Onychomycosis right foot.  Diabetes with angiopathy.  ROV.  Debride nails  X 5.  RTC 3 months.   Gardiner Barefoot DPM

## 2019-02-19 DIAGNOSIS — I10 Essential (primary) hypertension: Secondary | ICD-10-CM | POA: Diagnosis not present

## 2019-02-19 DIAGNOSIS — E7800 Pure hypercholesterolemia, unspecified: Secondary | ICD-10-CM | POA: Diagnosis not present

## 2019-02-19 DIAGNOSIS — D692 Other nonthrombocytopenic purpura: Secondary | ICD-10-CM | POA: Diagnosis not present

## 2019-02-19 DIAGNOSIS — I739 Peripheral vascular disease, unspecified: Secondary | ICD-10-CM | POA: Diagnosis not present

## 2019-02-19 DIAGNOSIS — E039 Hypothyroidism, unspecified: Secondary | ICD-10-CM | POA: Diagnosis not present

## 2019-02-19 DIAGNOSIS — Z0000 Encounter for general adult medical examination without abnormal findings: Secondary | ICD-10-CM | POA: Diagnosis not present

## 2019-02-19 DIAGNOSIS — Z1389 Encounter for screening for other disorder: Secondary | ICD-10-CM | POA: Diagnosis not present

## 2019-02-19 DIAGNOSIS — Z89612 Acquired absence of left leg above knee: Secondary | ICD-10-CM | POA: Diagnosis not present

## 2019-02-19 DIAGNOSIS — I480 Paroxysmal atrial fibrillation: Secondary | ICD-10-CM | POA: Diagnosis not present

## 2019-02-26 DIAGNOSIS — E039 Hypothyroidism, unspecified: Secondary | ICD-10-CM | POA: Diagnosis not present

## 2019-02-26 DIAGNOSIS — I10 Essential (primary) hypertension: Secondary | ICD-10-CM | POA: Diagnosis not present

## 2019-02-26 DIAGNOSIS — E7800 Pure hypercholesterolemia, unspecified: Secondary | ICD-10-CM | POA: Diagnosis not present

## 2019-03-27 ENCOUNTER — Ambulatory Visit: Payer: Medicare HMO | Admitting: Podiatry

## 2019-05-22 ENCOUNTER — Encounter: Payer: Self-pay | Admitting: Podiatry

## 2019-05-22 ENCOUNTER — Ambulatory Visit: Payer: Medicare HMO | Admitting: Podiatry

## 2019-05-22 ENCOUNTER — Other Ambulatory Visit: Payer: Self-pay

## 2019-05-22 VITALS — Temp 98.2°F

## 2019-05-22 DIAGNOSIS — M79675 Pain in left toe(s): Secondary | ICD-10-CM

## 2019-05-22 DIAGNOSIS — I739 Peripheral vascular disease, unspecified: Secondary | ICD-10-CM | POA: Diagnosis not present

## 2019-05-22 DIAGNOSIS — M79674 Pain in right toe(s): Secondary | ICD-10-CM | POA: Diagnosis not present

## 2019-05-22 DIAGNOSIS — B351 Tinea unguium: Secondary | ICD-10-CM | POA: Diagnosis not present

## 2019-05-22 DIAGNOSIS — D689 Coagulation defect, unspecified: Secondary | ICD-10-CM

## 2019-05-22 NOTE — Progress Notes (Signed)
This patient returns to my office for at risk foot care.  This patient requires this care by a professional since this patient will be at risk due to having PVD and coagulation defect.  Patient is taking coumadin. This patient has history of amputation left leg. Patient is in a wheelchair accompanied by his daughter.    This patient is unable to cut nails himself since the patient cannot reach his nails.These nails are painful walking and wearing shoes.  This patient presents for at risk foot care today.  General Appearance  Alert, conversant and in no acute stress.  Vascular  Dorsalis pedis and posterior tibial  pulses are not  palpable  right foot..  Capillary return is within normal limits  bilaterally. Temperature is within normal limits  bilaterally.  Neurologic  Deferred   Nails Thick disfigured discolored nails with subungual debris  from hallux to fifth toes right. No evidence of bacterial infection or drainage bilaterally.  Orthopedic  No limitations of motion  feet .  No crepitus or effusions noted.  No bony pathology or digital deformities noted. Amputation left leg.    Skin  normotropic skin with no porokeratosis noted bilaterally.  No signs of infections or ulcers noted.     Onychomycosis  Pain in right toes    Consent was obtained for treatment procedures.   Mechanical debridement of nails 1-5  right performed with a nail nipper.  Filed with dremel without incident.    Return office visit    3 months                  Told patient to return for periodic foot care and evaluation due to potential at risk complications.   Helane Gunther DPM

## 2019-07-24 ENCOUNTER — Emergency Department (HOSPITAL_COMMUNITY): Payer: Medicare HMO

## 2019-07-24 ENCOUNTER — Encounter (HOSPITAL_COMMUNITY): Payer: Self-pay

## 2019-07-24 ENCOUNTER — Inpatient Hospital Stay (HOSPITAL_COMMUNITY)
Admission: AD | Admit: 2019-07-24 | Discharge: 2019-07-28 | DRG: 309 | Disposition: A | Discharge status: Home Health | Payer: Medicare HMO | Attending: Internal Medicine | Admitting: Internal Medicine

## 2019-07-24 DIAGNOSIS — Z888 Allergy status to other drugs, medicaments and biological substances status: Secondary | ICD-10-CM

## 2019-07-24 DIAGNOSIS — Z86718 Personal history of other venous thrombosis and embolism: Secondary | ICD-10-CM | POA: Diagnosis not present

## 2019-07-24 DIAGNOSIS — R55 Syncope and collapse: Secondary | ICD-10-CM | POA: Diagnosis not present

## 2019-07-24 DIAGNOSIS — Z8673 Personal history of transient ischemic attack (TIA), and cerebral infarction without residual deficits: Secondary | ICD-10-CM

## 2019-07-24 DIAGNOSIS — I499 Cardiac arrhythmia, unspecified: Secondary | ICD-10-CM | POA: Diagnosis not present

## 2019-07-24 DIAGNOSIS — G9340 Encephalopathy, unspecified: Secondary | ICD-10-CM | POA: Diagnosis not present

## 2019-07-24 DIAGNOSIS — M1990 Unspecified osteoarthritis, unspecified site: Secondary | ICD-10-CM | POA: Diagnosis present

## 2019-07-24 DIAGNOSIS — I447 Left bundle-branch block, unspecified: Secondary | ICD-10-CM | POA: Diagnosis not present

## 2019-07-24 DIAGNOSIS — I480 Paroxysmal atrial fibrillation: Secondary | ICD-10-CM | POA: Diagnosis not present

## 2019-07-24 DIAGNOSIS — N35811 Other urethral stricture, male, meatal: Secondary | ICD-10-CM | POA: Diagnosis not present

## 2019-07-24 DIAGNOSIS — Z89612 Acquired absence of left leg above knee: Secondary | ICD-10-CM | POA: Diagnosis not present

## 2019-07-24 DIAGNOSIS — Z03818 Encounter for observation for suspected exposure to other biological agents ruled out: Secondary | ICD-10-CM | POA: Diagnosis not present

## 2019-07-24 DIAGNOSIS — H9190 Unspecified hearing loss, unspecified ear: Secondary | ICD-10-CM | POA: Diagnosis present

## 2019-07-24 DIAGNOSIS — N35919 Unspecified urethral stricture, male, unspecified site: Secondary | ICD-10-CM | POA: Diagnosis present

## 2019-07-24 DIAGNOSIS — R404 Transient alteration of awareness: Secondary | ICD-10-CM | POA: Diagnosis not present

## 2019-07-24 DIAGNOSIS — J9811 Atelectasis: Secondary | ICD-10-CM | POA: Diagnosis not present

## 2019-07-24 DIAGNOSIS — R339 Retention of urine, unspecified: Secondary | ICD-10-CM | POA: Diagnosis present

## 2019-07-24 DIAGNOSIS — Z87442 Personal history of urinary calculi: Secondary | ICD-10-CM

## 2019-07-24 DIAGNOSIS — R519 Headache, unspecified: Secondary | ICD-10-CM | POA: Diagnosis not present

## 2019-07-24 DIAGNOSIS — M2550 Pain in unspecified joint: Secondary | ICD-10-CM | POA: Diagnosis not present

## 2019-07-24 DIAGNOSIS — Z8249 Family history of ischemic heart disease and other diseases of the circulatory system: Secondary | ICD-10-CM

## 2019-07-24 DIAGNOSIS — R4020 Unspecified coma: Secondary | ICD-10-CM | POA: Diagnosis not present

## 2019-07-24 DIAGNOSIS — R410 Disorientation, unspecified: Secondary | ICD-10-CM | POA: Diagnosis not present

## 2019-07-24 DIAGNOSIS — Z8679 Personal history of other diseases of the circulatory system: Secondary | ICD-10-CM | POA: Diagnosis not present

## 2019-07-24 DIAGNOSIS — I959 Hypotension, unspecified: Secondary | ICD-10-CM | POA: Diagnosis not present

## 2019-07-24 DIAGNOSIS — M19032 Primary osteoarthritis, left wrist: Secondary | ICD-10-CM | POA: Diagnosis not present

## 2019-07-24 DIAGNOSIS — R739 Hyperglycemia, unspecified: Secondary | ICD-10-CM | POA: Diagnosis not present

## 2019-07-24 DIAGNOSIS — I4891 Unspecified atrial fibrillation: Secondary | ICD-10-CM | POA: Diagnosis not present

## 2019-07-24 DIAGNOSIS — I739 Peripheral vascular disease, unspecified: Secondary | ICD-10-CM | POA: Diagnosis present

## 2019-07-24 DIAGNOSIS — M25539 Pain in unspecified wrist: Secondary | ICD-10-CM

## 2019-07-24 DIAGNOSIS — M25432 Effusion, left wrist: Secondary | ICD-10-CM | POA: Diagnosis not present

## 2019-07-24 DIAGNOSIS — R338 Other retention of urine: Secondary | ICD-10-CM | POA: Diagnosis not present

## 2019-07-24 DIAGNOSIS — Z20822 Contact with and (suspected) exposure to covid-19: Secondary | ICD-10-CM | POA: Diagnosis not present

## 2019-07-24 DIAGNOSIS — Z7401 Bed confinement status: Secondary | ICD-10-CM | POA: Diagnosis not present

## 2019-07-24 LAB — COMPREHENSIVE METABOLIC PANEL
ALT: 23 U/L (ref 0–44)
AST: 50 U/L — ABNORMAL HIGH (ref 15–41)
Albumin: 3.5 g/dL (ref 3.5–5.0)
Alkaline Phosphatase: 49 U/L (ref 38–126)
Anion gap: 12 (ref 5–15)
BUN: 31 mg/dL — ABNORMAL HIGH (ref 8–23)
CO2: 23 mmol/L (ref 22–32)
Calcium: 8.9 mg/dL (ref 8.9–10.3)
Chloride: 103 mmol/L (ref 98–111)
Creatinine, Ser: 1.36 mg/dL — ABNORMAL HIGH (ref 0.61–1.24)
GFR calc Af Amer: 52 mL/min — ABNORMAL LOW (ref >60)
GFR calc non Af Amer: 45 mL/min — ABNORMAL LOW (ref >60)
Glucose, Bld: 170 mg/dL — ABNORMAL HIGH (ref 70–99)
Potassium: 4.9 mmol/L (ref 3.5–5.1)
Sodium: 138 mmol/L (ref 135–145)
Total Bilirubin: 2.1 mg/dL — ABNORMAL HIGH (ref 0.3–1.2)
Total Protein: 7.6 g/dL (ref 6.5–8.1)

## 2019-07-24 LAB — CBC
HCT: 47.2 % (ref 39.0–52.0)
Hemoglobin: 15 g/dL (ref 13.0–17.0)
MCH: 27.7 pg (ref 26.0–34.0)
MCHC: 31.8 g/dL (ref 30.0–36.0)
MCV: 87.1 fL (ref 80.0–100.0)
Platelets: 150 10*3/uL (ref 150–400)
RBC: 5.42 MIL/uL (ref 4.22–5.81)
RDW: 13.3 % (ref 11.5–15.5)
WBC: 13.5 10*3/uL — ABNORMAL HIGH (ref 4.0–10.5)
nRBC: 0 % (ref 0.0–0.2)

## 2019-07-24 LAB — HEMOGLOBIN A1C
Hgb A1c MFr Bld: 5.9 % — ABNORMAL HIGH (ref 4.8–5.6)
Mean Plasma Glucose: 122.63 mg/dL

## 2019-07-24 LAB — SARS CORONAVIRUS 2 BY RT PCR (HOSPITAL ORDER, PERFORMED IN ~~LOC~~ HOSPITAL LAB): SARS Coronavirus 2: NEGATIVE

## 2019-07-24 LAB — TSH: TSH: 3.835 u[IU]/mL (ref 0.350–4.500)

## 2019-07-24 LAB — MAGNESIUM: Magnesium: 1.7 mg/dL (ref 1.7–2.4)

## 2019-07-24 LAB — APTT: aPTT: 28 seconds (ref 24–36)

## 2019-07-24 LAB — CBG MONITORING, ED: Glucose-Capillary: 127 mg/dL — ABNORMAL HIGH (ref 70–99)

## 2019-07-24 LAB — BRAIN NATRIURETIC PEPTIDE: B Natriuretic Peptide: 144.9 pg/mL — ABNORMAL HIGH (ref 0.0–100.0)

## 2019-07-24 LAB — TROPONIN I (HIGH SENSITIVITY)
Troponin I (High Sensitivity): 12 ng/L (ref <18)
Troponin I (High Sensitivity): 20 ng/L — ABNORMAL HIGH (ref <18)

## 2019-07-24 LAB — PROTIME-INR
INR: 1.2 (ref 0.8–1.2)
Prothrombin Time: 14.5 seconds (ref 11.4–15.2)

## 2019-07-24 MED ORDER — SODIUM CHLORIDE 0.9 % IV BOLUS
1000.0000 mL | Freq: Once | INTRAVENOUS | Status: AC
  Administered 2019-07-24: 1000 mL via INTRAVENOUS

## 2019-07-24 MED ORDER — MAGNESIUM SULFATE 2 GM/50ML IV SOLN
2.0000 g | Freq: Once | INTRAVENOUS | Status: AC
  Administered 2019-07-24: 2 g via INTRAVENOUS
  Filled 2019-07-24: qty 50

## 2019-07-24 MED ORDER — INSULIN ASPART 100 UNIT/ML ~~LOC~~ SOLN
0.0000 [IU] | SUBCUTANEOUS | Status: DC
  Administered 2019-07-24 – 2019-07-26 (×3): 1 [IU] via SUBCUTANEOUS
  Administered 2019-07-27: 2 [IU] via SUBCUTANEOUS
  Administered 2019-07-27: 1 [IU] via SUBCUTANEOUS
  Administered 2019-07-28: 2 [IU] via SUBCUTANEOUS

## 2019-07-24 NOTE — ED Provider Notes (Signed)
[facility] Emergency Department Provider Note MRN:  [id number]  Arrival date & time: 07/24/19     Chief Complaint   Loss of Consciousness   History of Present Illness   [name] [name] is a 86 y.o. year-old male with a history of, A. fib, PAD presenting to the ED with chief complaint of LOC.  Patient has been having several days to weeks of decreased energy and seems to have dyspnea on exertion.  He had a fall that was mechanical last night, was seen by PCP.  Denies today, he had an unprovoked syncopal episode, he does not recall the event.  He was unconscious for less than 1 minute and quickly returned to baseline.  No shaking behavior, no incontinence.  He denies numbness or weakness, denies headache or vision change, no change to speech, no chest pain or shortness of breath, currently feels normal.  Given diltiazem by EMS as he was found to be in A. fib with RVR.  Review of Systems  A complete 10 system review of systems was obtained and all systems are negative except as noted in the HPI and PMH.   Patient's Health History    Past Medical History:  Diagnosis Date  . A-fib (HCC)   . Cerebral aneurysm    history of cerebral hemorrhage due to brain aneurysm rupture 25 yrs ago  . Complication of anesthesia    had Atrial Fib.- per patient  . DJD (degenerative joint disease)   . DVT (deep venous thrombosis) (HCC)    history of prior to amputation  . Dysrhythmia    PAF  . History of kidney stones    passed all except 1.  . HOH (hard of hearing)   . Peripheral arterial disease (HCC)   . Stroke [facility])    patient unaware  . Urethral stricture    history of dilated    Past Surgical History:  Procedure Laterality Date  . ABOVE KNEE LEG AMPUTATION     left  . APPENDECTOMY    . CYST EXCISION     above rectum  . CYSTOSCOPY     kidney stone  . HERNIA REPAIR Left   . LUMBAR WOUND DEBRIDEMENT N/A 06/22/2013   Procedure: incision and drainage of posterior  cervical wound;  Surgeon: Cristi Loron, MD;  Location: [facility];  Service: Neurosurgery;  Laterality: N/A;  . PROSTATECTOMY    . SUBOCCIPITAL CRANIECTOMY CERVICAL LAMINECTOMY Left 05/13/2013   Procedure: Left SUBOCCIPITAL CRANIECTOMY;  Surgeon: Cristi Loron, MD;  Location: [facility];  Service: Neurosurgery;  Laterality: Left;  . TONSILLECTOMY AND ADENOIDECTOMY    . VARICOSE VEIN SURGERY      Family History  Problem Relation Age of Onset  . Heart attack Father   . Breast cancer Daughter   . Breast cancer Mother   . Hypertension Other     Social History   Socioeconomic History  . Marital status: Widowed    Spouse name: Not on file  . Number of children: 2  . Years of education: Not on file  . Highest education level: Not on file  Occupational History  . Occupation: retired    Associate Professor: RETIRED  Tobacco Use  . Smoking status: Never Smoker  . Smokeless tobacco: Never Used  Substance and Sexual Activity  . Alcohol use: No  . Drug use: No  . Sexual activity: Not Currently  Other Topics Concern  . Not on file  Social History Narrative   Former  occasional smoker   Drinks occasionally   Originally from [place]   Used to work as Therapist, sports, Chief of Staff and in Designer, fashion/clothing   Social Determinants of Corporate investment banker Strain:   . Difficulty of Paying Living Expenses:   Food Insecurity:   . Worried About Programme researcher, broadcasting/film/video in the Last Year:   . Barista in the Last Year:   Transportation Needs:   . Freight forwarder (Medical):   Marland Kitchen Lack of Transportation (Non-Medical):   Physical Activity:   . Days of Exercise per Week:   . Minutes of Exercise per Session:   Stress:   . Feeling of Stress :   Social Connections:   . Frequency of Communication with Friends and Family:   . Frequency of Social Gatherings with Friends and Family:   . Attends Religious Services:   . Active Member of Clubs or Organizations:   . Attends Banker  Meetings:   Marland Kitchen Marital Status:   Intimate Partner Violence:   . Fear of Current or Ex-Partner:   . Emotionally Abused:   Marland Kitchen Physically Abused:   . Sexually Abused:      Physical Exam   Vitals:   07/24/19 1824 07/24/19 1825  BP:    Pulse: (!) 144 (!) 59  Resp: 15 19  Temp:    SpO2: 97% 97%    CONSTITUTIONAL: Chronically ill-appearing, NAD NEURO:  Alert and oriented x 3, no focal deficits EYES:  eyes equal and reactive ENT/NECK:  no LAD, no JVD CARDIO: Tachycardic rate, well-perfused, normal S1 and S2 PULM:  CTAB no wheezing or rhonchi GI/GU:  normal bowel sounds, non-distended, non-tender MSK/SPINE: Left leg amputation, no edema SKIN:  no rash, atraumatic PSYCH:  Appropriate speech and behavior  *Additional and/or pertinent findings included in MDM below  Diagnostic and Interventional Summary    EKG Interpretation  Date/Time:  Friday July 24 2019 16:10:31 EDT Ventricular Rate:  151 PR Interval:    QRS Duration: 68 QT Interval:  323 QTC Calculation: 485 R Axis:   5 Text Interpretation: Atrial fibrillation with rapid V-rate Ventricular premature complex Low voltage, precordial leads Repolarization abnormality, prob rate related Confirmed by Kennis Carina [phone]) on 07/24/2019 4:25:33 PM      Labs Reviewed  CBC - Abnormal; Notable for the following components:      Result Value   WBC 13.5 (*)    All other components within normal limits  COMPREHENSIVE METABOLIC PANEL - Abnormal; Notable for the following components:   Glucose, Bld 170 (*)    BUN 31 (*)    Creatinine, Ser 1.36 (*)    AST 50 (*)    Total Bilirubin 2.1 (*)    GFR calc non Af Amer 45 (*)    GFR calc Af Amer 52 (*)    All other components within normal limits  SARS CORONAVIRUS 2 BY RT PCR (HOSPITAL ORDER, PERFORMED IN  [facility])  URINALYSIS, ROUTINE W REFLEX MICROSCOPIC  TROPONIN I (HIGH SENSITIVITY)  TROPONIN I (HIGH SENSITIVITY)    DG Chest Port 1 View  Final Result       Medications  sodium chloride 0.9 % bolus 1,000 mL (1,000 mLs Intravenous New Bag/Given 07/24/19 1705)     Procedures  /  Critical Care .Critical Care Performed by: Sabas Sous, MD Authorized by: Sabas Sous, MD   Critical care provider statement:    Critical care time (minutes):  36   Critical  care was necessary to treat or prevent imminent or life-threatening deterioration of the following conditions: A. fib with RVR.   Critical care was time spent personally by me on the following activities:  Discussions with consultants, evaluation of patient's response to treatment, examination of patient, ordering and performing treatments and interventions, ordering and review of laboratory studies, ordering and review of radiographic studies, pulse oximetry, re-evaluation of patient's condition, obtaining history from patient or surrogate and review of old charts    ED Course and Medical Decision Making  I have reviewed the triage vital signs, the nursing notes, and pertinent available records from the EMR.  Listed above are laboratory and imaging tests that I personally ordered, reviewed, and interpreted and then considered in my medical decision making (see below for details).      A. fib with RVR, syncopal episode today, unclear when he transitioned into A. fib, has a remote history of A. fib but no recorded EKGs demonstrating A. fib, multiple on record showing sinus rhythm.  He is not anticoagulated, daughter explains this is because he has had multiple strokes.  Does not seem like a good candidate for cardioversion given the lack of confidence in the onset relief.,  Patient needs better rate control.  Heart rate currently between 130 and 160 despite being given diltiazem by EMS.  His blood pressure is soft in the 25E systolic.  Will provide IV fluids and likely consult cardiology for recommendations.  Suspect admission.  Patient's blood pressure somewhat improved after 1 L IV fluids but  still soft, 52D systolic.  Will obtain cardiology recommendations, suspect will need to admit to hospital service.  Barth Kirks. Sedonia Small, [place] [email or web address]  Final Clinical Impressions(s) / ED Diagnoses     ICD-10-CM   1. Atrial fibrillation with RVR ([place])  I48.91     ED Discharge Orders    None       Discharge Instructions Discussed with and Provided to Patient:   Discharge Instructions   None       Maudie Flakes, MD 07/24/19 1825

## 2019-07-24 NOTE — H&P (Signed)
[facility] History and Physical  [name] [name] BHA:[id number] DOB: April 29, 1926 DOA: 07/24/2019  Referring EDP: Pilar Plate PCP: Elias Else, MD   Chief Complaint: Loss of Consciousness   HPI: [name] [name] is a 86 y.o. 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  History provided by patient and his daughter. Presents after syncopal episode. Reports that yesterday evening he was messing with some dog gates and his wheelchair spun around and he fell out onto his left side hurting his left wrist. He was otherwise feeling fine last night. Today they went to an Orthopedic appt. Daughter, [name] [name], reports that she got him from the vehicle to the wheelchair and he subsequently passed out for a couple of minutes and then began to flutter his eyes. She called EMS. Patient does not remember much of the event and returned to baseline rather quickly thereafter. EMS gave Diltiazem and fluids due to rapid rate and low pressures. Currently patient denies any complaints. Daughter reports maybe noting some SOB over last few weeks but patient denise. Denies headache, dizziness, fever, chills, cough, SOB, chest pain, abdominal pain, nausea, vomiting, diarrhea, constipation, dysuria, hematuria, hematochezia, melena, difficulty moving arms/legs, speech difficulty, trouble eating, confusion or any other complaints.   In the ED: HR 130's-160's and patient in A fib with RVR but otherwise stable vitals on room air. Labs remarkable for WBC 13.5, glucose 170, Cr 1.36. Troponin 12 with repeat pending.   CXR: Minimal left basilar atelectasis. No other focal abnormality is noted.  Patient not given any  medications in ED. Cardiology consulted and pending recs but requested hospitalist admission.   Review of Systems:  All other systems negative unless noted above in HPI.   Past Medical History:  Diagnosis Date  . A-fib (HCC)   . Cerebral aneurysm    history of cerebral hemorrhage due to brain aneurysm rupture 25 yrs ago  . Complication of anesthesia    had Atrial Fib.- per patient  . DJD (degenerative joint disease)   . DVT (deep venous thrombosis) (HCC)    history of prior to amputation  . Dysrhythmia    PAF  . History of kidney stones    passed all except 1.  . HOH (hard of hearing)   . Peripheral arterial disease (HCC)   . Stroke [facility])    patient unaware  . Urethral stricture    history of dilated   Past Surgical History:  Procedure Laterality Date  . ABOVE KNEE LEG AMPUTATION     left  . APPENDECTOMY    . CYST EXCISION     above rectum  . CYSTOSCOPY     kidney stone  . HERNIA REPAIR Left   . LUMBAR WOUND DEBRIDEMENT N/A 06/22/2013   Procedure: incision and drainage of posterior cervical wound;  Surgeon: Cristi Loron, MD;  Location: [facility];  Service: Neurosurgery;  Laterality: N/A;  . PROSTATECTOMY    . SUBOCCIPITAL CRANIECTOMY CERVICAL LAMINECTOMY Left 05/13/2013   Procedure: Left SUBOCCIPITAL CRANIECTOMY;  Surgeon: Cristi Loron, MD;  Location: [facility];  Service: Neurosurgery;  Laterality: Left;  . TONSILLECTOMY AND ADENOIDECTOMY    . VARICOSE VEIN SURGERY     Social History:  reports that  he has never smoked. He has never used smokeless tobacco. He reports that he does not drink alcohol and does not use drugs.  Allergies  Allergen Reactions  . Coumadin [Warfarin Sodium] Other (See Comments)    Caused bleeding in brain    Family History  Problem Relation Age of Onset  . Heart attack Father   . Breast cancer Daughter   . Breast cancer Mother   . Hypertension Other     Prior to Admission medications   Medication Sig Start Date End Date  Taking? Authorizing Provider  Diaper Rash Products (DESITIN) OINT Apply 1 application topically daily as needed (Breakouts on ankle).    Yes [provider]  Multiple Vitamin (MULTIVITAMIN WITH MINERALS) TABS tablet Take 1 tablet by mouth every evening. [employer] Senior's   Yes [provider]  VOLTAREN 1 % GEL Apply 1 application topically in the morning and at bedtime. Apply sparingly. 07/24/19  Yes [provider]   Physical Exam: Vitals:   07/24/19 1929 07/24/19 1930 07/24/19 1940 07/24/19 1950  BP:  109/86 116/78 97/82  Pulse: 95 (!) 109 (!) 152 88  Resp: (!) 21 (!) 24 16 18   Temp:      TempSrc:      SpO2: 98% 97% 96% 97%    Wt Readings from Last 3 Encounters:  05/30/14 120.2 kg  12/07/13 84.4 kg  07/15/13 84.4 kg    . General:  Appears calm and comfortable. Alert and oriented to person and situation. Knows he is in the hospital but unsure which one. Not oriented to time.  . Eyes: EOMI, normal lids, irises & conjunctiva . ENT: grossly normal hearing, lips with blisters at edges, poor dentition . Neck: normal ROM . Cardiovascular: Irregularly irregular tachycardic rhythm, no m/r/g. No LE edema on RLE. Marland Kitchen Respiratory: CTA bilaterally, no w/r/r. Normal respiratory effort. . Abdomen: soft, ntnd . Skin: no rash or induration seen on limited exam . Musculoskeletal: grossly normal tone BUE, left AKA. Left wrist in brace. Marland Kitchen Psychiatric: grossly normal mood and affect, speech mumbled but understandable and mostly appropriate . Neurologic: grossly non-focal.          Labs on Admission:  Basic Metabolic Panel: Recent Labs  Lab 07/24/19 1637 07/24/19 1917  NA 138  --   K 4.9  --   CL 103  --   CO2 23  --   GLUCOSE 170*  --   BUN 31*  --   CREATININE 1.36*  --   CALCIUM 8.9  --   MG  --  1.7   Liver Function Tests: Recent Labs  Lab 07/24/19 1637  AST 50*  ALT 23  ALKPHOS 49  BILITOT 2.1*  PROT 7.6  ALBUMIN 3.5   No results for input(s):  LIPASE, AMYLASE in the last 168 hours. No results for input(s): AMMONIA in the last 168 hours. CBC: Recent Labs  Lab 07/24/19 1637  WBC 13.5*  HGB 15.0  HCT 47.2  MCV 87.1  PLT 150   Cardiac Enzymes: No results for input(s): CKTOTAL, CKMB, CKMBINDEX, TROPONINI in the last 168 hours.  BNP (last 3 results) No results for input(s): BNP in the last 8760 hours.  ProBNP (last 3 results) No results for input(s): PROBNP in the last 8760 hours.  CBG: Recent Labs  Lab 07/24/19 2008  GLUCAP 127*    Radiological Exams on Admission: DG Chest Port 1 View  Result Date: 07/24/2019 CLINICAL DATA:  Atrial fibrillation EXAM: PORTABLE CHEST 1  VIEW COMPARISON:  05/30/2014 FINDINGS: Cardiac shadow remains enlarged. Aortic calcifications are again seen. The lungs are well aerated bilaterally. Minimal atelectatic changes are noted in the left lateral lung base. No bony abnormality is seen. IMPRESSION: Minimal left basilar atelectasis. No other focal abnormality is noted. Electronically Signed   By: Alcide Clever M.D.   On: 07/24/2019 17:23    EKG: Independently reviewed. HR 151. A fib with RVR. PVCs present. QTc 485. No STEMI. Most recent comparison from 2016 and patient in sinus rhythm at that time.   Assessment/Plan Principal Problem:   Atrial fibrillation with RVR (HCC) Active Problems:   Peripheral arterial disease (HCC)   PVD (peripheral vascular disease) (HCC)   History of cerebellar hemorrhage   History of DVT (deep vein thrombosis)  86 y.o. 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  Afib with RVR - presented after syncope in Afib with RVR, s/p Diltiazem by EMS -  asymptomatic currently - converted to sinus later in ED after seen by Cardiology - Tele - Hx PAF - Will not anticoagulate given hx of cerebellar hemorrhage - No ASA - Cardiology following - Echo ordered - Daily Mag; ordered on admission and 1.7; will replete  - TSH ordered   Hx of Cerebellar Hemorrhage with surgical evacuation Hx DVT - will not anticoagulate due to hx hemorrhage   Hyperglycemia without diagnosis DM - glucose 170 on admission - q4h glucose checks with sliding scale while inpatient  Code Status: Full DVT Prophylaxis: None; hx of cerebellar hemorrhage  Family Communication: Daughter, [name] [name], at bedside Disposition Plan: Admit to inpatient. Patient requiring specialty consultation, IV medications and further workup. Patient is at high risk for further decompensation due to age and co-morbidities. Anticipate discharge possibly to SNF in 5-7 days.   Time spent: 70 minutes  Joselyn Arrow, MD [facility] Pager [phone]

## 2019-07-24 NOTE — ED Notes (Signed)
[name] porter- [phone]

## 2019-07-24 NOTE — ED Triage Notes (Signed)
Pt bib gcems w/ c/o syncopal episode and afib rvr. Pt wheelchair bound at baseline and was at MD appt for L wrist when pt had syncopal episode in wheelchair slumped over. On EMS arrival, pt found to be afib rvr. Pt received 20 mg IV cardizem w/ EMS, w/ no change.

## 2019-07-24 NOTE — Consult Note (Addendum)
Cardiology Consultation:   Patient ID: [name] [name] MRN: [id number]; DOB: [birth year]  Admit date: 07/24/2019 Date of Consult: 07/24/2019  Primary Care Provider: Elias Else, MD [facility] Cardiologist: Peter Swaziland, MD  [facility] Electrophysiologist:  None    Patient Profile:   [name] [name] is a 86 y.o. male with a hx of CVA, cerebellar bleed with surgical evacuation, coumadin stopped, on chronic amiodarone in 2016 and was on amiodarone in SR, PAD, DVT, past amputation of Left leg, not seen since 2016 who is being seen today for the evaluation of atrial fib at the request of Dr. Pilar Plate.  History of Present Illness:   [name] [name] with hx of CVA, cerebellar bleed with surgical evacuation, coumadin stopped, on chronic amiodarone in 2016 and was on amiodarone in SR, PAD, DVT, past amputation of Left leg, not seen since 2016.   Today he presented to ER with syncope and found to be in a fib with RVR.  He had been to MD office -Recently injured Lt wrist, no fx but ortho placed a splint. He was back at home and passed out while sitting in wheel chair.  Eyes rolled back and unresponsive.     EMS found him to be in a fib with RVR and rec;d 20 mg IV Dilt by EMS.  Since the several years since cards has seen he has done well followed by PCP.  He has been off amiodarone for some time.  No recent hx of chest pain no SOB.   EKG:  The EKG was personally reviewed and demonstrates:  A fib with RVR at 151 aberrant beats vs PVCs and ST depression latera; ST depression   Telemetry:  Telemetry was personally reviewed and demonstrates:  A fib   Hs troponin 12 Na 138, K+ 4.9, BUN 31, Cr 1.38 Total bili 2.1 WBC 13.5, Hgb 15, plts 150  CXR Portable Minimal left basilar atelectasis. No other focal abnormality is noted    Currently not aware of rapid HR, not aware of any rapid HR no chest pain and no SOB.  Somewhat difficult to understand chronic slurred speech and repeats himself freq.  His daughter is  with him, he lives with her.   Past Medical History:  Diagnosis Date  . A-fib (HCC)   . Cerebral aneurysm    history of cerebral hemorrhage due to brain aneurysm rupture 25 yrs ago  . Complication of anesthesia    had Atrial Fib.- per patient  . DJD (degenerative joint disease)   . DVT (deep venous thrombosis) (HCC)    history of prior to amputation  . Dysrhythmia    PAF  . History of kidney stones    passed all except 1.  . HOH (hard of hearing)   . Peripheral arterial disease (HCC)   . Stroke [facility])    patient unaware  . Urethral stricture    history of dilated    Past Surgical History:  Procedure Laterality Date  . ABOVE KNEE LEG AMPUTATION     left  . APPENDECTOMY    . CYST EXCISION     above rectum  . CYSTOSCOPY     kidney stone  . HERNIA REPAIR Left   . LUMBAR WOUND DEBRIDEMENT N/A 06/22/2013   Procedure: incision and drainage of posterior cervical wound;  Surgeon: Cristi Loron, MD;  Location: [facility];  Service: Neurosurgery;  Laterality: N/A;  . PROSTATECTOMY    . SUBOCCIPITAL CRANIECTOMY CERVICAL LAMINECTOMY Left 05/13/2013   Procedure: Left SUBOCCIPITAL  CRANIECTOMY;  Surgeon: Cristi Loron, MD;  Location: [facility];  Service: Neurosurgery;  Laterality: Left;  . TONSILLECTOMY AND ADENOIDECTOMY    . VARICOSE VEIN SURGERY       Home Medications:  Prior to Admission medications   Medication Sig Start Date End Date Taking? Authorizing Provider  Diaper Rash Products (DESITIN) OINT Apply 1 application topically daily as needed (Breakouts on ankle).    Yes [provider]  Multiple Vitamin (MULTIVITAMIN WITH MINERALS) TABS tablet Take 1 tablet by mouth every evening. [employer] Senior's   Yes [provider]  VOLTAREN 1 % GEL Apply 1 application topically in the morning and at bedtime. Apply sparingly. 07/24/19  Yes [provider]    Inpatient Medications: Scheduled Meds:  Continuous Infusions:  PRN Meds:   Allergies:     Allergies  Allergen Reactions  . Coumadin [Warfarin Sodium] Other (See Comments)    Caused bleeding in brain    Social History:   Social History   Socioeconomic History  . Marital status: Widowed    Spouse name: Not on file  . Number of children: 2  . Years of education: Not on file  . Highest education level: Not on file  Occupational History  . Occupation: retired    Associate Professor: RETIRED  Tobacco Use  . Smoking status: Never Smoker  . Smokeless tobacco: Never Used  Substance and Sexual Activity  . Alcohol use: No  . Drug use: No  . Sexual activity: Not Currently  Other Topics Concern  . Not on file  Social History Narrative   Former occasional smoker   Drinks occasionally   Originally from [place]   Used to work as Therapist, sports, Chief of Staff and in Designer, fashion/clothing   Social Determinants of Corporate investment banker Strain:   . Difficulty of Paying Living Expenses:   Food Insecurity:   . Worried About Programme researcher, broadcasting/film/video in the Last Year:   . Barista in the Last Year:   Transportation Needs:   . Freight forwarder (Medical):   Marland Kitchen Lack of Transportation (Non-Medical):   Physical Activity:   . Days of Exercise per Week:   . Minutes of Exercise per Session:   Stress:   . Feeling of Stress :   Social Connections:   . Frequency of Communication with Friends and Family:   . Frequency of Social Gatherings with Friends and Family:   . Attends Religious Services:   . Active Member of Clubs or Organizations:   . Attends Banker Meetings:   Marland Kitchen Marital Status:   Intimate Partner Violence:   . Fear of Current or Ex-Partner:   . Emotionally Abused:   Marland Kitchen Physically Abused:   . Sexually Abused:     Family History:    Family History  Problem Relation Age of Onset  . Heart attack Father   . Breast cancer Daughter   . Breast cancer Mother   . Hypertension Other      ROS:  Please see the history of present illness.  General:no colds or fevers, no  weight changes Skin:no rashes or ulcers HEENT:no blurred vision, no congestion hx of HOH CV:see HPI PUL:see HPI GI:no diarrhea constipation or melena, no indigestion GU:no hematuria, no dysuria MS:no joint pain, no claudication Neuro:no syncope, no lightheadedness Endo:no diabetes, no thyroid disease  All other ROS reviewed and negative.     Physical Exam/Data:   Vitals:   07/24/19 1822 07/24/19 1823 07/24/19  1824 07/24/19 1825  BP:      Pulse: (!) 39 (!) 135 (!) 144 (!) 59  Resp: 17 16 15 19   Temp:      TempSrc:      SpO2: 98% 98% 97% 97%   No intake or output data in the 24 hours ending 07/24/19 1911 Last 3 Weights 05/30/2014 12/07/2013 07/15/2013  Weight (lbs) 265 lb 186 lb 186 lb  Weight (kg) 120.203 kg 84.369 kg 84.369 kg     There is no height or weight on file to calculate BMI.  General:  Well nourished, well developed, in no acute distress HEENT: normal Lymph: no adenopathy Neck: no JVD Endocrine:  No thryomegaly Vascular: No carotid bruits; Rt pedal pulse with shoe in place and LAKA Cardiac:  irreg irreg S1, S2; RRR; no murmur gallup rub or click Lungs:  clear to auscultation bilaterally, no wheezing, rhonchi or rales  Abd: soft, nontender, no hepatomegaly  Ext: no edema of Rt leg Musculoskeletal:  Lt AKA Skin: warm and dry  Neuro:  Pt does not answer questions directly but tells long reason, no focal abnormalities noted, has chronic slurred speech Psych:  Normal affect    Relevant CV Studies: Echo 2016 Study Conclusions   - Left ventricle: The cavity size was normal. Wall thickness was  increased in a pattern of mild LVH. The estimated ejection  fraction was 60%. Regional wall motion abnormalities cannot be  excluded.  - Right ventricle: Not able to assess RV function due to poor  visualization. Not able to assess RV size due to poor  visualization.  - Impressions: No cardiac source of embolism was identified, but  cannot be ruled out on  the basis of this examination.   Impressions:   - No cardiac source of embolism was identified, but cannot be ruled  out on the basis of this examination.    Laboratory Data:  High Sensitivity Troponin:   Recent Labs  Lab 07/24/19 1637  TROPONINIHS 12     Chemistry Recent Labs  Lab 07/24/19 1637  NA 138  K 4.9  CL 103  CO2 23  GLUCOSE 170*  BUN 31*  CREATININE 1.36*  CALCIUM 8.9  GFRNONAA 45*  GFRAA 52*  ANIONGAP 12    Recent Labs  Lab 07/24/19 1637  PROT 7.6  ALBUMIN 3.5  AST 50*  ALT 23  ALKPHOS 49  BILITOT 2.1*   Hematology Recent Labs  Lab 07/24/19 1637  WBC 13.5*  RBC 5.42  HGB 15.0  HCT 47.2  MCV 87.1  MCH 27.7  MCHC 31.8  RDW 13.3  PLT 150   BNPNo results for input(s): BNP, PROBNP in the last 168 hours.  DDimer No results for input(s): DDIMER in the last 168 hours.   Radiology/Studies:  DG Chest Port 1 View  Result Date: 07/24/2019 CLINICAL DATA:  Atrial fibrillation EXAM: PORTABLE CHEST 1 VIEW COMPARISON:  05/30/2014 FINDINGS: Cardiac shadow remains enlarged. Aortic calcifications are again seen. The lungs are well aerated bilaterally. Minimal atelectatic changes are noted in the left lateral lung base. No bony abnormality is seen. IMPRESSION: Minimal left basilar atelectasis. No other focal abnormality is noted. Electronically Signed   By: Inez Catalina M.D.   On: 07/24/2019 17:23         NO CHEST PAIN  Assessment and Plan:   1. A fib RVR -  With hx of PAF. No anticoagulation since cerebral bleed.  No angina, no SOB, pt unaware of rapid HR.  While in room pt converted to SR with PVCs.  No rate slowing meds at home.  No ASA.  May need BB to see if this will help prevent a fib. Or at least rapid. check echo. 2. Hx of cerebral bleed with surgical evacuation, no further anticoagulation noted and hx of second CVA 3. PAD and Lt AKA      For questions or updates, please contact [facility] Please consult [email or web address] for contact  info under    Signed, Nada Boozer, NP  07/24/2019 7:11 PM  Patient seen and examined with Nada Boozer NP.  Agree as above, with the following exceptions and changes as noted below. [name] [name] initially presented with a syncopal episode, afib RVR and hypotension. His rates have now improved and he appears to be in sinus rhythm. Gen: NAD, CV: RRR, no murmurs, Lungs: clear, Abd: soft, Extrem: Left AKA Neuro/Psych: pleasant but confused. All available labs, radiology testing, previous records reviewed. He appears to have converted out of atrial fibrillation to sinus rhythm. With hypotension earlier, would observe overnight. Could consider reinitiating amiodarone if afib recurs, otherwise would consider digoxin going forwards given soft BP. No indication for [facility] given prior cerebral bleed. Recommend observation overnight and echocardiogram in the morning.   Parke Poisson, MD 07/24/19 9:10 PM

## 2019-07-25 ENCOUNTER — Inpatient Hospital Stay (HOSPITAL_COMMUNITY): Payer: Medicare HMO

## 2019-07-25 DIAGNOSIS — Z8679 Personal history of other diseases of the circulatory system: Secondary | ICD-10-CM

## 2019-07-25 DIAGNOSIS — R55 Syncope and collapse: Secondary | ICD-10-CM

## 2019-07-25 DIAGNOSIS — I739 Peripheral vascular disease, unspecified: Secondary | ICD-10-CM

## 2019-07-25 LAB — URINALYSIS, ROUTINE W REFLEX MICROSCOPIC
Bilirubin Urine: NEGATIVE
Glucose, UA: NEGATIVE mg/dL
Ketones, ur: NEGATIVE mg/dL
Nitrite: NEGATIVE
Protein, ur: 100 mg/dL — AB
RBC / HPF: >50 RBC/hpf — ABNORMAL HIGH (ref 0–5)
Specific Gravity, Urine: 1.024 (ref 1.005–1.030)
pH: 5 (ref 5.0–8.0)

## 2019-07-25 LAB — GLUCOSE, CAPILLARY
Glucose-Capillary: 104 mg/dL — ABNORMAL HIGH (ref 70–99)
Glucose-Capillary: 106 mg/dL — ABNORMAL HIGH (ref 70–99)
Glucose-Capillary: 108 mg/dL — ABNORMAL HIGH (ref 70–99)
Glucose-Capillary: 112 mg/dL — ABNORMAL HIGH (ref 70–99)
Glucose-Capillary: 140 mg/dL — ABNORMAL HIGH (ref 70–99)

## 2019-07-25 LAB — CBC
HCT: 39.2 % (ref 39.0–52.0)
Hemoglobin: 12.6 g/dL — ABNORMAL LOW (ref 13.0–17.0)
MCH: 27.6 pg (ref 26.0–34.0)
MCHC: 32.1 g/dL (ref 30.0–36.0)
MCV: 86 fL (ref 80.0–100.0)
Platelets: 124 10*3/uL — ABNORMAL LOW (ref 150–400)
RBC: 4.56 MIL/uL (ref 4.22–5.81)
RDW: 13.2 % (ref 11.5–15.5)
WBC: 10.5 10*3/uL (ref 4.0–10.5)
nRBC: 0 % (ref 0.0–0.2)

## 2019-07-25 LAB — COMPREHENSIVE METABOLIC PANEL
ALT: 15 U/L (ref 0–44)
AST: 20 U/L (ref 15–41)
Albumin: 3 g/dL — ABNORMAL LOW (ref 3.5–5.0)
Alkaline Phosphatase: 41 U/L (ref 38–126)
Anion gap: 13 (ref 5–15)
BUN: 31 mg/dL — ABNORMAL HIGH (ref 8–23)
CO2: 20 mmol/L — ABNORMAL LOW (ref 22–32)
Calcium: 8.2 mg/dL — ABNORMAL LOW (ref 8.9–10.3)
Chloride: 105 mmol/L (ref 98–111)
Creatinine, Ser: 1.04 mg/dL (ref 0.61–1.24)
GFR calc Af Amer: >60 mL/min (ref >60)
GFR calc non Af Amer: >60 mL/min (ref >60)
Glucose, Bld: 111 mg/dL — ABNORMAL HIGH (ref 70–99)
Potassium: 3.9 mmol/L (ref 3.5–5.1)
Sodium: 138 mmol/L (ref 135–145)
Total Bilirubin: 1.1 mg/dL (ref 0.3–1.2)
Total Protein: 6.4 g/dL — ABNORMAL LOW (ref 6.5–8.1)

## 2019-07-25 LAB — MAGNESIUM: Magnesium: 2.2 mg/dL (ref 1.7–2.4)

## 2019-07-25 MED ORDER — LACTATED RINGERS IV SOLN: INTRAVENOUS | Status: AC

## 2019-07-25 MED ORDER — PANTOPRAZOLE SODIUM 40 MG PO TBEC
40.0000 mg | DELAYED_RELEASE_TABLET | Freq: Every day | ORAL | Status: DC
  Administered 2019-07-25 – 2019-07-28 (×4): 40 mg via ORAL
  Filled 2019-07-25 (×4): qty 1

## 2019-07-25 MED ORDER — CHLORHEXIDINE GLUCONATE CLOTH 2 % EX PADS
6.0000 | MEDICATED_PAD | Freq: Every day | CUTANEOUS | Status: DC
  Administered 2019-07-25 – 2019-07-28 (×4): 6 via TOPICAL

## 2019-07-25 MED ORDER — LIDOCAINE HCL URETHRAL/MUCOSAL 2 % EX GEL
1.0000 "application " | Freq: Once | CUTANEOUS | Status: DC
  Filled 2019-07-25: qty 11

## 2019-07-25 MED ORDER — SODIUM CHLORIDE 0.9 % IV SOLN
1.0000 g | INTRAVENOUS | Status: DC
  Administered 2019-07-25 – 2019-07-28 (×4): 1 g via INTRAVENOUS
  Filled 2019-07-25 (×4): qty 10

## 2019-07-25 MED ORDER — METOPROLOL TARTRATE 25 MG PO TABS
25.0000 mg | ORAL_TABLET | Freq: Two times a day (BID) | ORAL | Status: DC
  Administered 2019-07-25 – 2019-07-28 (×7): 25 mg via ORAL
  Filled 2019-07-25 (×7): qty 1

## 2019-07-25 NOTE — Progress Notes (Signed)
Attempted Coude Cathetar with [name] [name] assisting. Coude insertion unsuccessful. Assigned nurse made aware to follow up with urology.

## 2019-07-25 NOTE — Progress Notes (Signed)
EEG complete - results pending 

## 2019-07-25 NOTE — Progress Notes (Signed)
   07/25/19 1905  Assess: MEWS Score  Temp 98.9 F (37.2 C)  BP (!) 110/20  ECG Heart Rate 67  Resp (!) 26  Level of Consciousness Alert  SpO2 98 %  O2 Device Room Air  Assess: MEWS Score  MEWS Temp 0  MEWS Systolic 0  MEWS Pulse 0  MEWS RR 2  MEWS LOC 0  MEWS Score 2  MEWS Score Color Yellow  Assess: if the MEWS score is Yellow or Red  Were vital signs taken at a resting state? Yes  Focused Assessment Documented focused assessment  Early Detection of Sepsis Score *See Row Information* Low  MEWS guidelines implemented *See Row Information* Yes  Treat  MEWS Interventions Escalated (See documentation below)  Take Vital Signs  Increase Vital Sign Frequency  Yellow: Q 2hr X 2 then Q 4hr X 2, if remains yellow, continue Q 4hrs  Escalate  MEWS: Escalate Yellow: discuss with charge nurse/RN and consider discussing with provider and RRT  Notify: Charge Nurse/RN  Name of Charge Nurse/RN Notified Noreene Larsson RN  Date Charge Nurse/RN Notified 07/25/19  Time Charge Nurse/RN Notified 2320

## 2019-07-25 NOTE — Consult Note (Signed)
Urology Consult  Consulting MD: Lala Lund, MD  CC: Urinary retention, difficult catheter placement placement  HPI: This is a 86year old male admitted for nonurologic purposes.  He has not voided recently and residual urine volume is close to 1000 mL.  Urologic consultation is requested after, by history, placement of coud tip catheter was unsuccessful.  The patient does have a history of BPH, and had a TURP over 20 years ago.  About 11 years ago he had incision of bladder neck contracture.  He does have moderate cognitive issues, and is unable to let me know if he has had problems urinating prior to hospitalization.  PMH: Past Medical History:  Diagnosis Date  . A-fib ([place])   . Cerebral aneurysm    history of cerebral hemorrhage due to brain aneurysm rupture 25 yrs ago  . Complication of anesthesia    had Atrial Fib.- per patient  . DJD (degenerative joint disease)   . DVT (deep venous thrombosis) (HCC)    history of prior to amputation  . Dysrhythmia    PAF  . History of kidney stones    passed all except 1.  . HOH (hard of hearing)   . Peripheral arterial disease ([place])   . Stroke [facility])    patient unaware  . Urethral stricture    history of dilated    PSH: Past Surgical History:  Procedure Laterality Date  . ABOVE KNEE LEG AMPUTATION     left  . APPENDECTOMY    . CYST EXCISION     above rectum  . CYSTOSCOPY     kidney stone  . HERNIA REPAIR Left   . LUMBAR WOUND DEBRIDEMENT N/A 06/22/2013   Procedure: incision and drainage of posterior cervical wound;  Surgeon: Ophelia Charter, MD;  Location: [place] NEURO ORS;  Service: Neurosurgery;  Laterality: N/A;  . PROSTATECTOMY    . SUBOCCIPITAL CRANIECTOMY CERVICAL LAMINECTOMY Left 05/13/2013   Procedure: Left SUBOCCIPITAL CRANIECTOMY;  Surgeon: Ophelia Charter, MD;  Location: [place] NEURO ORS;  Service: Neurosurgery;  Laterality: Left;  . TONSILLECTOMY AND ADENOIDECTOMY    . VARICOSE VEIN SURGERY       Allergies: Allergies  Allergen Reactions  . Coumadin [Warfarin Sodium] Other (See Comments)    Caused bleeding in brain    Medications: Medications Prior to Admission  Medication Sig Dispense Refill Last Dose  . Diaper Rash Products (DESITIN) OINT Apply 1 application topically daily as needed (Breakouts on ankle).    Past Week at Unknown time  . Multiple Vitamin (MULTIVITAMIN WITH MINERALS) TABS tablet Take 1 tablet by mouth every evening. [employer] Senior's   07/23/2019 at Unknown time  . VOLTAREN 1 % GEL Apply 1 application topically in the morning and at bedtime. Apply sparingly.   hasn't picked up     Social History: Social History   Socioeconomic History  . Marital status: Widowed    Spouse name: Not on file  . Number of children: 2  . Years of education: Not on file  . Highest education level: Not on file  Occupational History  . Occupation: retired    Fish farm manager: RETIRED  Tobacco Use  . Smoking status: Never Smoker  . Smokeless tobacco: Never Used  Substance and Sexual Activity  . Alcohol use: No  . Drug use: No  . Sexual activity: Not Currently  Other Topics Concern  . Not on file  Social History Narrative   Former occasional smoker   Drinks occasionally   Originally from [place]  Used to work as Therapist, sports, Chief of Staff and in Designer, fashion/clothing   Social Determinants of Corporate investment banker Strain:   . Difficulty of Paying Living Expenses:   Food Insecurity:   . Worried About Programme researcher, broadcasting/film/video in the Last Year:   . Barista in the Last Year:   Transportation Needs:   . Freight forwarder (Medical):   Marland Kitchen Lack of Transportation (Non-Medical):   Physical Activity:   . Days of Exercise per Week:   . Minutes of Exercise per Session:   Stress:   . Feeling of Stress :   Social Connections:   . Frequency of Communication with Friends and Family:   . Frequency of Social Gatherings with Friends and Family:   . Attends Religious Services:   .  Active Member of Clubs or Organizations:   . Attends Banker Meetings:   Marland Kitchen Marital Status:   Intimate Partner Violence:   . Fear of Current or Ex-Partner:   . Emotionally Abused:   Marland Kitchen Physically Abused:   . Sexually Abused:     Family History: Family History  Problem Relation Age of Onset  . Heart attack Father   . Breast cancer Daughter   . Breast cancer Mother   . Hypertension Other     Review of Systems: I was unable to get a full history due to cognitive impairment   Physical Exam: @VITALS2 @ General: No acute distress.  Awake.  Mostly appropriate Head:  Normocephalic.  Atraumatic. ENT:  EOMI.  Mucous membranes moist Neck:  Supple.  No lymphadenopathy. CV:  Regular rate. Pulmonary: Equal effort bilaterally.   Skin:  Normal turgor.  No visible rash. Neurologic: Alert. Appropriate mood.  Penis:  Uncircumcised.  No lesions. Urethra: Orthotopic meatus. Scrotum: No lesions.  No ecchymosis.  No erythema. Testicles: Descended bilaterally.  No masses bilaterally. Epididymis: Palpable bilaterally.  Non Tender to palpation.  Studies:  Recent Labs    07/24/19 1637 07/25/19 0447  HGB 15.0 12.6*  WBC 13.5* 10.5  PLT 150 124*    Recent Labs    07/24/19 1637 07/25/19 0447  NA 138 138  K 4.9 3.9  CL 103 105  CO2 23 20*  BUN 31* 31*  CREATININE 1.36* 1.04  CALCIUM 8.9 8.2*  GFRNONAA 45* >60  GFRAA 52* >60     Recent Labs    07/24/19 1958  INR 1.2  APTT 28     Invalid input(s): ABG  After sterile prep and drape, 30 cc of 2% viscous lidocaine was introduced into the urethra.  18 French coud tip catheter was then passed.  Clear urine obtained.  Hooked to bedside bag  Assessment: Mild urethral stricture, catheter negotiated past this  Plan: I will leave it up to medical staff to remove catheter at their leisure  Reconsult urology when needed    Pager:[phone]

## 2019-07-25 NOTE — Procedures (Signed)
Patient Name: [name] [name]  MRN: [id number]  EEG Attending: Ritta Slot  Referring Physician/Provider: Burney Gauze Date: 07/25/2019 Duration: 2  Patient history: 86 year old male with loss of consciousness, also previous history of posterior stroke with craniectomy  Level of alertness:   AEDs during EEG study:   Technical aspects: This EEG study was done with scalp electrodes positioned according to the 10-20 International system of electrode placement. Electrical activity was acquired at a sampling rate of 500Hz  and reviewed with a high frequency filter of 70Hz  and a low frequency filter of 1Hz . EEG data were recorded continuously and digitally stored.   BACKGROUND ACTIVITY: There is a posterior dominant rhythm of 8 Hz which is seen bilaterally, though slightly better appreciated on the left than the right (I suspect this is due to his previous craniectomy).  There is also mild diffuse generalized delta range activity throughout the recording.  EPILEPTIFORM ACTIVITY: Interictal epileptiform activity: None  Ictal Activity: None  OTHER EVENTS: None  SLEEP RECORDINGS:  Only awake and drowsy states were recorded. Drowsiness was characterized by attenuation of the posterior background rhythm.  ACTIVATION PROCEDURES:  Hyperventilation and photic stimulation were not performed.  IMPRESSION: This study is consistent with a mild generalized nonspecific cerebral dysfunction (encephalopathy).  there was no seizure or evidence of seizure predisposition recorded on this study. Please note that lack of epileptiform activity on EEG does not preclude the possibility of epilepsy.    , MD [facility] [phone]  If 7pm- 7am, please page neurology on call as listed in AMION.

## 2019-07-25 NOTE — Progress Notes (Signed)
Attempted to do I& O x 2 with Brittney RN but unsuccessful . MD on call made aware again & ordered to insert coudet.cath.

## 2019-07-25 NOTE — Progress Notes (Signed)
PROGRESS NOTE                                                                                                                                                                                                             Patient Demographics:    [name] [name], is a 86 y.o. male, DOB - 1926-04-16, HEN:[id number]  Admit date - 07/24/2019   Admitting Physician Joselyn Arrow, MD  Outpatient Primary MD for the patient is Elias Else, MD  LOS - 1  Chief Complaint  Patient presents with  . Loss of Consciousness       Brief Narrative - [name] [name] is a 86 y.o. 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 fib RVR.   Subjective:    [name] [name] today has, No headache, No chest pain, No abdominal pain - No Nausea, No new weakness tingling or numbness, No Cough - SOB.  Somewhat of an unreliable historian due to encephalopathy.   Assessment  & Plan :     1.  Acute encephalopathy with loss of consciousness likely due to A. fib RVR with hypoperfusion.  Since he has history of head bleed in the past will get head CT to rule out any bleed, will also get EEG and echocardiogram.  He is currently mildly confused but has no subjective complaints.  No focal deficits which are apparent.  Continue rate control and monitor studies. PT OT eval and advance activity.  2.  Chronic atrial fibrillation was in RVR.  [place] vas 2 score of greater than 3.  Not on anticoagulation due to previous history of cerebral hemorrhage requiring surgical evacuation, is on low-dose beta-blocker, currently in rate control, stable TSH echocardiogram pending.  3.  History of cerebral hemorrhage requiring surgical evacuation and  DVT.  Currently not on anticoagulation supportive care.  4.  PAD s/p left AKA.  Supportive care not on aspirin or statin.  5.  History of urethral stricture.  Urinary retention in the hospital, urology consulted and Foley placed this hospital.  Will likely go home on Foley catheter with outpatient urology follow-up.   BP low right now cannot add Flomax.  6.  Stress related hyperglycemia.  A1c stable.  No DM type II.  Supportive care with sliding scale while he is in the hospital and monitor.  CBG (last 3)  Recent Labs    07/25/19 0007 07/25/19 0500 07/25/19 0745  GLUCAP 112* 108* 104*   Lab Results  Component Value Date   HGBA1C 5.9 (H) 07/24/2019     Condition -  Guarded  Family Communication  :  Daughter [name] [phone] 07/25/2019 at 11:20 AM  Code Status :  Full  Consults  : Urology  Procedures  :    TTE  CT   PUD Prophylaxis :   Disposition Plan  :    Status is: Inpatient  Remains inpatient appropriate because:Unsafe d/c plan   Dispo: The patient is from: Home              Anticipated d/c is to: Home              Anticipated d/c date is: 3 days              Patient currently is not medically stable to d/c.,  Syncope with A. fib RVR.  High fall risk, needs management of A. fib, rate control, PT eval.  DVT Prophylaxis  :   SCDs   Lab Results  Component Value Date   PLT 124 (L) 07/25/2019    Diet :  Diet Order            Diet Heart Room service appropriate? Yes; Fluid consistency: Thin  Diet effective now                  Inpatient Medications Scheduled Meds: . insulin aspart  0-9 Units Subcutaneous Q4H  . lidocaine  1 application Urethral Once   Continuous Infusions: . cefTRIAXone (ROCEPHIN)  IV 1 g (07/25/19 0903)   PRN Meds:.  Antibiotics  :   Anti-infectives (From admission, onward)   Start     Dose/Rate Route Frequency Ordered Stop   07/25/19 0800  cefTRIAXone (ROCEPHIN) 1 g in sodium chloride 0.9 % 100 mL IVPB     Discontinue      1 g 200 mL/hr over 30 Minutes Intravenous Every 24 hours 07/25/19 0710 07/30/19 0759          Objective:   Vitals:   07/24/19 2107 07/25/19 0010 07/25/19 0423 07/25/19 0746  BP: (!) 118/53 (!) 118/53 (!) 129/59 (!) 106/56  Pulse: 86 80  82  Resp: 20 (!) 22 18 20   Temp: 98.3 F (36.8 C) 98.7 F (37.1 C) 98.5 F (36.9 C) 97.6 F (36.4 C)  TempSrc: Axillary Oral Oral Oral  SpO2: 98% 96% 93% 97%  Weight: 74.3 kg  74.9 kg   Height: 5\' 11"  (1.803 m)       SpO2: 97 %  Wt Readings from Last 3 Encounters:  07/25/19 74.9 kg  05/30/14 120.2 kg  12/07/13 84.4 kg    No intake or output data in the 24 hours ending 07/25/19 1122   Physical Exam  Awake, mildly confused, No new F.N deficits,  Everest.AT,PERRAL Supple Neck,No JVD, No cervical lymphadenopathy appriciated.  Symmetrical Chest wall movement, Good air movement bilaterally, CTAB iRRR,No Gallops,Rubs or new Murmurs, No Parasternal Heave +ve B.Sounds, Abd Soft, No tenderness, No organomegaly appriciated, No rebound - guarding or rigidity. No Cyanosis, L.AKA    Data Review:    Recent Labs  Lab 07/24/19 1637 07/25/19 0447  WBC 13.5* 10.5  HGB 15.0 12.6*  HCT 47.2 39.2  PLT 150 124*  MCV 87.1 86.0  MCH 27.7  27.6  MCHC 31.8 32.1  RDW 13.3 13.2    Recent Labs  Lab 07/24/19 1637 07/24/19 1917 07/24/19 1958 07/24/19 2228 07/25/19 0447  NA 138  --   --   --  138  K 4.9  --   --   --  3.9  CL 103  --   --   --  105  CO2 23  --   --   --  20*  GLUCOSE 170*  --   --   --  111*  BUN 31*  --   --   --  31*  CREATININE 1.36*  --   --   --  1.04  CALCIUM 8.9  --   --   --  8.2*  AST 50*  --   --   --  20  ALT 23  --   --   --  15  ALKPHOS 49  --   --   --  41  BILITOT 2.1*  --   --   --  1.1  ALBUMIN 3.5  --   --   --  3.0*  MG  --  1.7  --   --  2.2  INR  --   --  1.2  --   --   TSH  --   --   --  3.835  --   HGBA1C  --   --  5.9*  --   --   BNP  --   --   --  144.9*  --     Recent Labs  Lab  07/24/19 1856 07/24/19 2228  BNP  --  144.9*  SARSCOV2NAA NEGATIVE  --     ------------------------------------------------------------------------------------------------------------------ No results for input(s): CHOL, HDL, LDLCALC, TRIG, CHOLHDL, LDLDIRECT in the last 72 hours.  Lab Results  Component Value Date   HGBA1C 5.9 (H) 07/24/2019   ------------------------------------------------------------------------------------------------------------------ Recent Labs    07/24/19 2228  TSH 3.835   ------------------------------------------------------------------------------------------------------------------ No results for input(s): VITAMINB12, FOLATE, FERRITIN, TIBC, IRON, RETICCTPCT in the last 72 hours.  Coagulation profile Recent Labs  Lab 07/24/19 1958  INR 1.2    No results for input(s): DDIMER in the last 72 hours.  Cardiac Enzymes No results for input(s): CKMB, TROPONINI, MYOGLOBIN in the last 168 hours.  Invalid input(s): CK ------------------------------------------------------------------------------------------------------------------    Component Value Date/Time   BNP 144.9 (H) 07/24/2019 2228    Micro Results Recent Results (from the past 240 hour(s))  SARS Coronavirus 2 by RT PCR (hospital order, performed in [facility] hospital lab) Nasopharyngeal Nasopharyngeal Swab     Status: None   Collection Time: 07/24/19  6:56 PM   Specimen: Nasopharyngeal Swab  Result Value Ref Range Status   SARS Coronavirus 2 NEGATIVE NEGATIVE Final    Comment: (NOTE) SARS-CoV-2 target nucleic acids are NOT DETECTED.  The SARS-CoV-2 RNA is generally detectable in upper and lower respiratory specimens during the acute phase of infection. The lowest concentration of SARS-CoV-2 viral copies this assay can detect is 250 copies / mL. A negative result does not preclude SARS-CoV-2 infection and should not be used as the sole basis for treatment or other patient  management decisions.  A negative result may occur with improper specimen collection / handling, submission of specimen other than nasopharyngeal swab, presence of viral mutation(s) within the areas targeted by this assay, and inadequate number of viral copies (<250 copies / mL). A negative result must be combined with clinical observations, patient history, and  epidemiological information.  Fact Sheet for Patients:   BoilerBrush.com.[name]  Fact Sheet for Healthcare Providers: https://pope.com/  This test is not yet approved or  cleared by the [place] FDA and has been authorized for detection and/or diagnosis of SARS-CoV-2 by FDA under an Emergency Use Authorization (EUA).  This EUA will remain in effect (meaning this test can be used) for the duration of the COVID-19 declaration under Section 564(b)(1) of the Act, 21 U.S.C. section 360bbb-3(b)(1), unless the authorization is terminated or revoked sooner.  Performed at [facility], [address], [place], [place] [address]     Radiology Reports DG Chest Port 1 View  Result Date: 07/24/2019 CLINICAL DATA:  Atrial fibrillation EXAM: PORTABLE CHEST 1 VIEW COMPARISON:  05/30/2014 FINDINGS: Cardiac shadow remains enlarged. Aortic calcifications are again seen. The lungs are well aerated bilaterally. Minimal atelectatic changes are noted in the left lateral lung base. No bony abnormality is seen. IMPRESSION: Minimal left basilar atelectasis. No other focal abnormality is noted. Electronically Signed   By: Alcide Clever M.D.   On: 07/24/2019 17:23    Time Spent in minutes  30   Susa Raring M.D on 07/25/2019 at 11:22 AM  To page go to [email or web address] - password [facility]

## 2019-07-25 NOTE — Progress Notes (Signed)
Noted pt.has not voided since he came up from ED .bladder scan done & obtained 600 cc .MD on call made aware ordered to do I &O .

## 2019-07-25 NOTE — Progress Notes (Signed)
Progress Note  Patient Name: [name] [name] Date of Encounter: 07/25/2019  [facility] Cardiologist: Peter Swaziland, MD   Subjective   Resting comfortably in bed today. Has remained in sinus rhythm. Discussed that the trigger for this is unclear, but given his history would not recommend anticoagulation due to risk. All questions answered.  Inpatient Medications    Scheduled Meds: . insulin aspart  0-9 Units Subcutaneous Q4H  . lidocaine  1 application Urethral Once  . metoprolol tartrate  25 mg Oral BID  . pantoprazole  40 mg Oral Daily   Continuous Infusions: . cefTRIAXone (ROCEPHIN)  IV 1 g (07/25/19 0903)  . lactated ringers     PRN Meds:    Vital Signs    Vitals:   07/24/19 2107 07/25/19 0010 07/25/19 0423 07/25/19 0746  BP: (!) 118/53 (!) 118/53 (!) 129/59 (!) 106/56  Pulse: 86 80  82  Resp: 20 (!) 22 18 20   Temp: 98.3 F (36.8 C) 98.7 F (37.1 C) 98.5 F (36.9 C) 97.6 F (36.4 C)  TempSrc: Axillary Oral Oral Oral  SpO2: 98% 96% 93% 97%  Weight: 74.3 kg  74.9 kg   Height: 5\' 11"  (1.803 m)      No intake or output data in the 24 hours ending 07/25/19 1145 Last 3 Weights 07/25/2019 07/24/2019 05/30/2014  Weight (lbs) 165 lb 2 oz 163 lb 12.8 oz 265 lb  Weight (kg) 74.9 kg 74.3 kg 120.203 kg      Telemetry    Has remained in sinus rhythm - Personally Reviewed  ECG    Initial ECG atrial fib with RVR at 151 bpm - Personally Reviewed  Physical Exam   GEN: No acute distress.   Neck: No JVD Cardiac: RRR, no murmurs, rubs, or gallops.  Respiratory: Clear to auscultation bilaterally. GI: Soft, nontender, non-distended  MS: No edema; s/p L AKA Neuro:  Nonfocal , chronic slurred speech Psych: Normal affect   Labs    High Sensitivity Troponin:   Recent Labs  Lab 07/24/19 1637 07/24/19 1917  TROPONINIHS 12 20*      Chemistry Recent Labs  Lab 07/24/19 1637 07/25/19 0447  NA 138 138  K 4.9 3.9  CL 103 105  CO2 23 20*  GLUCOSE 170* 111*  BUN  31* 31*  CREATININE 1.36* 1.04  CALCIUM 8.9 8.2*  PROT 7.6 6.4*  ALBUMIN 3.5 3.0*  AST 50* 20  ALT 23 15  ALKPHOS 49 41  BILITOT 2.1* 1.1  GFRNONAA 45* >60  GFRAA 52* >60  ANIONGAP 12 13     Hematology Recent Labs  Lab 07/24/19 1637 07/25/19 0447  WBC 13.5* 10.5  RBC 5.42 4.56  HGB 15.0 12.6*  HCT 47.2 39.2  MCV 87.1 86.0  MCH 27.7 27.6  MCHC 31.8 32.1  RDW 13.3 13.2  PLT 150 124*    BNP Recent Labs  Lab 07/24/19 2228  BNP 144.9*     DDimer No results for input(s): DDIMER in the last 168 hours.   Radiology    DG Chest Port 1 View  Result Date: 07/24/2019 CLINICAL DATA:  Atrial fibrillation EXAM: PORTABLE CHEST 1 VIEW COMPARISON:  05/30/2014 FINDINGS: Cardiac shadow remains enlarged. Aortic calcifications are again seen. The lungs are well aerated bilaterally. Minimal atelectatic changes are noted in the left lateral lung base. No bony abnormality is seen. IMPRESSION: Minimal left basilar atelectasis. No other focal abnormality is noted. Electronically Signed   By: 09/23/2019 M.D.   On: 07/24/2019  17:23    Cardiac Studies   Echo ordered  Patient Profile     86 y.o. male with a history of CVA, ICH, PAD s/p amputation who is seen for recurrent atrial fibrillation.  Assessment & Plan    Paroxysmal atrial fibrillation, with RVR on presentation: -not a candidate for anticoagulation given prior intracranial hemorrhage requiring surgical evacuation -spontaneously converted to sinus rhythm -tolerating metoprolol tartrate 25 mg BID -echo pending -remotely seen by Dr. Martinique, not since 2017. Will arrange for follow up at our [place] office  History of intracranial hemorrhage: no anticoagulation, as above  PAD s/p left AKA: on no statin, but given age and comorbidities, this is acceptable  [facility] will sign off.   Medication Recommendations:  As currently ordered Other recommendations (labs, testing, etc):  none Follow up as an outpatient:  We will  arrange for follow up. Recommend Dr. Martinique or Dr. Delphina Cahill teams at [facility]  For questions or updates, please contact [facility] Please consult [email or web address] for contact info under    Signed, Buford Dresser, MD  07/25/2019, 11:45 AM

## 2019-07-26 ENCOUNTER — Other Ambulatory Visit (HOSPITAL_COMMUNITY): Payer: Medicare Other

## 2019-07-26 LAB — CBC WITH DIFFERENTIAL/PLATELET
Abs Immature Granulocytes: 0.07 10*3/uL (ref 0.00–0.07)
Basophils Absolute: 0 10*3/uL (ref 0.0–0.1)
Basophils Relative: 0 %
Eosinophils Absolute: 0.2 10*3/uL (ref 0.0–0.5)
Eosinophils Relative: 2 %
HCT: 36.4 % — ABNORMAL LOW (ref 39.0–52.0)
Hemoglobin: 11.7 g/dL — ABNORMAL LOW (ref 13.0–17.0)
Immature Granulocytes: 1 %
Lymphocytes Relative: 10 %
Lymphs Abs: 1 10*3/uL (ref 0.7–4.0)
MCH: 28.1 pg (ref 26.0–34.0)
MCHC: 32.1 g/dL (ref 30.0–36.0)
MCV: 87.3 fL (ref 80.0–100.0)
Monocytes Absolute: 1.5 10*3/uL — ABNORMAL HIGH (ref 0.1–1.0)
Monocytes Relative: 15 %
Neutro Abs: 7.4 10*3/uL (ref 1.7–7.7)
Neutrophils Relative %: 72 %
Platelets: 121 10*3/uL — ABNORMAL LOW (ref 150–400)
RBC: 4.17 MIL/uL — ABNORMAL LOW (ref 4.22–5.81)
RDW: 13.3 % (ref 11.5–15.5)
WBC: 10.1 10*3/uL (ref 4.0–10.5)
nRBC: 0 % (ref 0.0–0.2)

## 2019-07-26 LAB — GLUCOSE, CAPILLARY
Glucose-Capillary: 105 mg/dL — ABNORMAL HIGH (ref 70–99)
Glucose-Capillary: 112 mg/dL — ABNORMAL HIGH (ref 70–99)
Glucose-Capillary: 118 mg/dL — ABNORMAL HIGH (ref 70–99)
Glucose-Capillary: 122 mg/dL — ABNORMAL HIGH (ref 70–99)
Glucose-Capillary: 88 mg/dL (ref 70–99)
Glucose-Capillary: 93 mg/dL (ref 70–99)

## 2019-07-26 LAB — COMPREHENSIVE METABOLIC PANEL
ALT: 16 U/L (ref 0–44)
AST: 20 U/L (ref 15–41)
Albumin: 2.6 g/dL — ABNORMAL LOW (ref 3.5–5.0)
Alkaline Phosphatase: 37 U/L — ABNORMAL LOW (ref 38–126)
Anion gap: 9 (ref 5–15)
BUN: 28 mg/dL — ABNORMAL HIGH (ref 8–23)
CO2: 22 mmol/L (ref 22–32)
Calcium: 8.1 mg/dL — ABNORMAL LOW (ref 8.9–10.3)
Chloride: 108 mmol/L (ref 98–111)
Creatinine, Ser: 0.96 mg/dL (ref 0.61–1.24)
GFR calc Af Amer: >60 mL/min (ref >60)
GFR calc non Af Amer: >60 mL/min (ref >60)
Glucose, Bld: 85 mg/dL (ref 70–99)
Potassium: 3.8 mmol/L (ref 3.5–5.1)
Sodium: 139 mmol/L (ref 135–145)
Total Bilirubin: 0.7 mg/dL (ref 0.3–1.2)
Total Protein: 5.8 g/dL — ABNORMAL LOW (ref 6.5–8.1)

## 2019-07-26 LAB — MAGNESIUM: Magnesium: 2 mg/dL (ref 1.7–2.4)

## 2019-07-26 LAB — BRAIN NATRIURETIC PEPTIDE: B Natriuretic Peptide: 125.6 pg/mL — ABNORMAL HIGH (ref 0.0–100.0)

## 2019-07-26 LAB — PROCALCITONIN: Procalcitonin: <0.1 ng/mL

## 2019-07-26 MED ORDER — ASPIRIN 81 MG PO CHEW
81.0000 mg | CHEWABLE_TABLET | Freq: Every day | ORAL | Status: DC
  Administered 2019-07-26 – 2019-07-28 (×3): 81 mg via ORAL
  Filled 2019-07-26 (×3): qty 1

## 2019-07-26 NOTE — Evaluation (Signed)
Physical Therapy Evaluation Patient Details Name: [name] [name] MRN: [id number] DOB: [birth year] Today's Date: 07/26/2019   History of Present Illness  86 y.o. 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 fib RVR.  Clinical Impression  Orders received for PT evaluation. Patient demonstrates deficits in functional mobility as indicated below. Will benefit from continued skilled PT to address deficits and maximize function. Will see as indicated and progress as tolerated.  At this time, patient unable to perform near PLOF due to injury of left wrist. Prior to admission patient transferred to/from wheelchair independently. Patient now unable to push off with LUE due to pain and patient requires increased assist for basic functional tasks. Feel patient will likely need st SNF upon acute discharge. Daughter present and aware.    Follow Up Recommendations SNF;Supervision/Assistance - 24 hour    Equipment Recommendations  Other (comment) (drop arm equipment [facility]))    Recommendations for Other Services       Precautions / Restrictions Precautions Precautions: Fall Restrictions Weight Bearing Restrictions: No      Mobility  Bed Mobility Overal bed mobility: Needs Assistance Bed Mobility: Rolling;Supine to Sit;Sit to Supine Rolling: Supervision   Supine to sit: Min assist Sit to supine: Min assist   General bed mobility comments: min assist for trunk positioning during transitional movements. Assist to scoot to [facility]   Transfers Overall transfer level: Needs assistance Equipment used: 2 person hand held assist Transfers: Squat Pivot Transfers;Lateral/Scoot Transfers     Squat pivot  transfers: Max assist    Lateral/Scoot Transfers: Max assist;+2 physical assistance General transfer comment: Patient required increased physical assist to perform transfer with poor ability to elevate and push off from bed and commode due to left wrist.    Ambulation/Gait             General Gait Details: non ambulatory  Stairs            Wheelchair Mobility    Modified Rankin (Stroke Patients Only)       Balance Overall balance assessment: Needs assistance;History of Falls   Sitting balance-Leahy Scale: Fair Sitting balance - Comments: patient requires min guard/supervision for safety in sitting at EOB, limited postural control  Postural control: Posterior lean     Standing balance comment: unable to perform                             Pertinent Vitals/Pain Pain Assessment: Faces Faces Pain Scale: Hurts even more Pain Location: left wrist Pain Descriptors / Indicators: Sore Pain Intervention(s): Monitored during session    Home Living Family/patient expects to be discharged to:: Private residence Living Arrangements: Children Available Help at Discharge: Available PRN/intermittently Type of Home: House Home Access: Ramped entrance     Home Layout: Two level;Able to live on main level with bedroom/bathroom Home Equipment: Bedside commode;Wheelchair - manual;Tub bench Additional Comments: daughter works     Prior Function Level of Independence: Secondary school teacher / Transfers Assistance Needed: use of wheel chair, faughter states independent with transfers     Comments: daughter assists with meals and some functional set up     Hand Dominance   Dominant Hand: Right  Extremity/Trunk Assessment   Upper Extremity Assessment Upper Extremity Assessment: LUE deficits/detail LUE Deficits / Details: splinted wrist LUE Coordination: decreased fine motor;decreased gross motor    Lower Extremity Assessment Lower Extremity  Assessment: Generalized weakness (history of left AKA)       Communication      Cognition Arousal/Alertness: Awake/alert Behavior During Therapy: WFL for tasks assessed/performed Overall Cognitive Status: History of cognitive impairments - at baseline                                 General Comments: patient difficult to understand, perseverative at times      General Comments      Exercises     Assessment/Plan    PT Assessment Patient needs continued PT services  PT Problem List Decreased strength;Decreased activity tolerance;Decreased balance;Decreased mobility;Decreased cognition;Decreased safety awareness;Pain       PT Treatment Interventions DME instruction;Functional mobility training;Therapeutic activities;Therapeutic exercise;Balance training;Neuromuscular re-education;Cognitive remediation;Patient/family education;Wheelchair mobility training    PT Goals (Current goals can be found in the Care Plan section)  Acute Rehab PT Goals Patient Stated Goal: to go home PT Goal Formulation: With patient/family Time For Goal Achievement: 08/09/19 Potential to Achieve Goals: Fair    Frequency Min 2X/week   Barriers to discharge        Co-evaluation               AM-PAC PT "6 Clicks" Mobility  Outcome Measure Help needed turning from your back to your side while in a flat bed without using bedrails?: A Lot Help needed moving from lying on your back to sitting on the side of a flat bed without using bedrails?: A Lot Help needed moving to and from a bed to a chair (including a wheelchair)?: A Lot Help needed standing up from a chair using your arms (e.g., wheelchair or bedside chair)?: A Lot Help needed to walk in hospital room?: Total Help needed climbing 3-5 steps with a railing? : Total 6 Click Score: 10    End of Session Equipment Utilized During Treatment: Gait belt Activity Tolerance: Patient limited by fatigue;Patient limited by  pain Patient left: in bed;with call bell/phone within reach;with bed alarm set;with family/visitor present;with SCD's reapplied Nurse Communication: Mobility status (recommend use of Drop arm commode and recler) PT Visit Diagnosis: Repeated falls (R29.6);Muscle weakness (generalized) (M62.81);Pain Pain - Right/Left: Left Pain - part of body: Hand    Time: 1131-1151 PT Time Calculation (min) (ACUTE ONLY): 20 min   Charges:   PT Evaluation $PT Eval Moderate Complexity: 1 Mod          Charlotte Crumb, PT DPT  Board Certified Neurologic Specialist Acute Rehabilitation Services Pager [phone] Office [phone]   Fabio Asa 07/26/2019, 1:24 PM

## 2019-07-26 NOTE — Progress Notes (Signed)
Occupational Therapy Evaluation Patient Details Name: [name] [name] MRN: [id number] DOB: 1926/03/16 Today's Date: 07/26/2019    History of Present Illness 86 y.o. 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 fib RVR.   Clinical Impression   Prior to hospitalization, pt was living with his daughter in a 1 level house with ramp access. Pt was dependent on daughter for IADLs, and required min assist for dressing, supervision for toileting, mod assist for sponge bathing, and no assistance for grooming. Pt's daughter works from home and is available 24/7 to supervise/assist pt. Pt admitted for above and limited by decreased balance, decreased activity tolerance, decreased safety awareness, and increased pain. Today, pt received semi-reclined in bed with L wrist brace donned and daughter present at bedside. At times pt mumbles and perseverates on specific topics, requiring min verbal cues for redirection. Since pt has cognitive impairments at baseline, daughter confirmed answers to questions asked by OT. Pt requires supervision for rolling and min assist for transitioning to sitting EOB. Once sitting EOB, pt requires supervision-min assist for trunk support secondary to posterior/left lean. Pt setup to wash face sitting EOB. Functional transfer not attempted today secondary to safety concerns, however pt would most likely require max assist x2 using transfer board. Pt c/o 10/10 L wrist pain, severe swelling noted from fall. Pt would benefit from continued skilled acute OT services to address ADLs, functional transfers, balance, and safety awareness. Recommending SNF for additional rehab to increase strength and  activity tolerance. Will continue to follow pt acutely as able.      Follow Up Recommendations  SNF;Supervision/Assistance - 24 hour    Equipment Recommendations  None recommended by OT    Recommendations for Other Services       Precautions / Restrictions Precautions Precautions: Fall Restrictions Weight Bearing Restrictions: No      Mobility Bed Mobility Overal bed mobility: Needs Assistance Bed Mobility: Rolling;Supine to Sit;Sit to Supine Rolling: Supervision   Supine to sit: Min assist Sit to supine: Min assist   General bed mobility comments: min assist for side-lying to sitting EOB; supervison-min guard for balancing siting EOB; 1 LOB noted; requires verbal cues for proper hand placement on bed railings for support; max assist for moving hips towards [facility]  Transfers                 General transfer comment: did not perform functional transfer secondary to safety concerns    Balance Overall balance assessment: Needs assistance;History of Falls Sitting-balance support: Bilateral upper extremity supported;Feet supported Sitting balance-Leahy Scale: Fair Sitting balance - Comments: patient requires min guard/supervision for safety in sitting at EOB, limited postural control  Postural control: Posterior lean;Left lateral lean     Standing balance comment: unable to perform                           ADL either performed or assessed with clinical judgement   ADL Overall ADL's : Needs assistance/impaired Eating/Feeding: Set up;Bed level   Grooming: Wash/dry face;Set up;Sitting   Upper Body Bathing: Moderate assistance;Sitting   Lower Body Bathing: Maximal assistance;Sitting/lateral leans;Bed level   Upper Body Dressing : Sitting;Moderate assistance   Lower Body Dressing: Sitting/lateral leans;Maximal  assistance;Bed level   Toilet Transfer: Maximal assistance;+2 for safety/equipment;+2 for physical assistance;Transfer board;BSC   Toileting-  Clothing Manipulation and Hygiene: Maximal assistance;Sitting/lateral lean;Bed level       Functional mobility during ADLs: Moderate assistance;Minimal assistance;Min guard (mod assist-min guard for bed mobility and sitting EOB) General ADL Comments: pt requires trunk support sitting EOB; sliding board transfer not performed today secondary to safety concerns     Vision Baseline Vision/History: Wears glasses Wears Glasses: At all times Patient Visual Report: No change from baseline;Other (comment) (Pt reports that he is basically going blind)       Perception     Praxis      Pertinent Vitals/Pain Pain Assessment: 0-10 Pain Score: 10-Worst pain ever Faces Pain Scale: Hurts even more Pain Location: left wrist Pain Descriptors / Indicators: Sore;Tender Pain Intervention(s): Limited activity within patient's tolerance;Monitored during session;Repositioned;Other (comment) (pt reports that L wrist brace feels supportive and comfy)     Hand Dominance Right   Extremity/Trunk Assessment Upper Extremity Assessment Upper Extremity Assessment: Overall WFL for tasks assessed;LUE deficits/detail LUE Deficits / Details: splinted wrist LUE: Unable to fully assess due to immobilization LUE Sensation: WNL LUE Coordination: decreased fine motor;decreased gross motor (edema/swelling noted)   Lower Extremity Assessment Lower Extremity Assessment: Defer to PT evaluation   Cervical / Trunk Assessment Cervical / Trunk Assessment: Normal   Communication Communication Communication: No difficulties;Other (comment) (mumbles)   Cognition Arousal/Alertness: Awake/alert Behavior During Therapy: WFL for tasks assessed/performed Overall Cognitive Status: History of cognitive impairments - at baseline                                 General Comments: patient difficult to understand, perseverative at times   General Comments  pt's daughter sitting at bedside; able to confirm  background information and PLOF    Exercises     Shoulder Instructions      Home Living Family/patient expects to be discharged to:: Private residence Living Arrangements: Children Available Help at Discharge: Available PRN/intermittently Type of Home: House Home Access: Ramped entrance     Home Layout: Two level;Able to live on main level with bedroom/bathroom     Bathroom Shower/Tub: Occupational psychologist: Standard Bathroom Accessibility: No   Home Equipment: Environmental consultant - 2 wheels;Cane - single point;Bedside commode;Shower seat;Wheelchair - manual   Additional Comments: daughter works from home and lives with pt      Prior Functioning/Environment Level of Independence: Needs assistance  Gait / Transfers Assistance Needed: use of wheelchair; no longer uses LLE prosthetic; daughter reports that pt required supervision for transfers ADL's / Homemaking Assistance Needed: daughter reports that pt requires a little assist with dressing, supervision for toileting, and mod assist for sponge bathing   Comments: daughter assists with meals and some functional set up        OT Problem List: Decreased strength;Decreased range of motion;Decreased activity tolerance;Impaired balance (sitting and/or standing);Decreased cognition;Decreased safety awareness;Impaired UE functional use;Pain      OT Treatment/Interventions: Self-care/ADL training;Therapeutic exercise;Energy conservation;DME and/or AE instruction;Therapeutic activities;Patient/family education    OT Goals(Current goals can be found in the care plan section) Acute Rehab OT Goals Patient Stated Goal: to go home  OT Frequency: Min 2X/week   Barriers to D/C:            Co-evaluation              AM-PAC OT "6 Clicks" Daily Activity  Outcome Measure Help from another person eating meals?: None Help from another person taking care of personal grooming?: A Little Help from another person toileting, which  includes using toliet, bedpan, or urinal?: A Lot Help from another person bathing (including washing, rinsing, drying)?: A Lot Help from another person to put on and taking off regular upper body clothing?: A Little Help from another person to put on and taking off regular lower body clothing?: A Lot 6 Click Score: 16   End of Session Equipment Utilized During Treatment: Gait belt Nurse Communication: Mobility status  Activity Tolerance: Patient tolerated treatment well Patient left: in bed;with call bell/phone within reach;with family/visitor present  OT Visit Diagnosis: Unsteadiness on feet (R26.81);Muscle weakness (generalized) (M62.81);History of falling (Z91.81);Other symptoms and signs involving cognitive function;Pain Pain - Right/Left: Left Pain - part of body: Hand                Time: 1334-1416 OT Time Calculation (min): 42 min Charges:  OT General Charges $OT Visit: 1 Visit OT Evaluation $OT Eval Moderate Complexity: 1 Mod OT Treatments $Self Care/Home Management : 8-22 mins $Therapeutic Activity: 8-22 mins  Norris Cross, OTR/[facility] Acute Rehab Services [phone]  Mechele Claude 07/26/2019, 5:11 PM

## 2019-07-26 NOTE — Progress Notes (Addendum)
PROGRESS NOTE                                                                                                                                                                                                             Patient Demographics:    [name] [name], is a 86 y.o. male, DOB - 01-Sep-1926, HFW:[id number]  Admit date - 07/24/2019   Admitting Physician Joselyn Arrow, MD  Outpatient Primary MD for the patient is Elias Else, MD  LOS - 2  Chief Complaint  Patient presents with  . Loss of Consciousness       Brief Narrative - [name] [name] is a 87 y.o. 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 fib RVR.   Subjective:   Patient in bed, appears comfortable, denies any headache, no fever, no chest pain or pressure, no shortness of breath , no abdominal pain. No focal weakness.   Assessment  & Plan :     1.  Acute encephalopathy with loss of consciousness likely due to A. fib RVR with hypoperfusion.  Stable CT head and EEG showing no acute changes except mild encephalopathy on EEG, pending echocardiogram.  He is currently mildly confused but overall much improved than the day of admission, has no subjective complaint at this time.  No focal deficits which are apparent.  Continue rate control and monitor studies. PT OT eval and advance activity.  2.  Chronic atrial fibrillation was in RVR.  [place] vas 2 score of greater than 3.  Not on anticoagulation due to previous history of cerebral hemorrhage requiring surgical evacuation, is on low-dose beta-blocker, have placed an order for baby aspirin with consent of the daughter, risks and benefits explained clearly, currently in rate  control, stable TSH echocardiogram pending.  3.  History of cerebral hemorrhage requiring surgical evacuation and DVT.  Currently not on anticoagulation supportive care.  4.  PAD s/p left AKA.  Supportive care not on aspirin or statin.  5.  History of urethral stricture.  Urinary retention in the hospital, urology consulted and Foley placed this hospital.  Will likely go home on Foley catheter with outpatient urology follow-up.   BP low right now cannot add Flomax.  6. L. Wrist injury  -  has been seen & cleared by orthopedics a day before the admission, continue splint and supportive care.  7.  Stress related hyperglycemia.  A1c stable.  No DM type II.  Supportive care with sliding scale while he is in the hospital and monitor.  CBG (last 3)  Recent Labs    07/26/19 0046 07/26/19 0449 07/26/19 0731  GLUCAP 93 122* 88   Lab Results  Component Value Date   HGBA1C 5.9 (H) 07/24/2019     Condition -  Guarded  Family Communication  :  Daughter [name] [name] [phone] 07/25/2019 at 11:20 AM, 07/26/19  Code Status :  Full  Consults  : Urology  Procedures  :    TTE  CT head.  No acute bleed.   PUD Prophylaxis :   Disposition Plan  :    Status is: Inpatient  Remains inpatient appropriate because:Unsafe d/c plan   Dispo: The patient is from: Home              Anticipated d/c is to: Home              Anticipated d/c date is: 3 days              Patient currently is not medically stable to d/c.,  Syncope with A. fib RVR.  High fall risk, needs management of A. fib, rate control, PT eval.  DVT Prophylaxis  :   SCDs   Lab Results  Component Value Date   PLT 121 (L) 07/26/2019    Diet :  Diet Order            Diet Heart Room service appropriate? Yes; Fluid consistency: Thin  Diet effective now                  Inpatient Medications Scheduled Meds: . Chlorhexidine Gluconate Cloth  6 each Topical Daily  . insulin aspart  0-9 Units Subcutaneous Q4H  . lidocaine  1  application Urethral Once  . metoprolol tartrate  25 mg Oral BID  . pantoprazole  40 mg Oral Daily   Continuous Infusions: . cefTRIAXone (ROCEPHIN)  IV 1 g (07/26/19 0940)   PRN Meds:.  Antibiotics  :   Anti-infectives (From admission, onward)   Start     Dose/Rate Route Frequency Ordered Stop   07/25/19 0800  cefTRIAXone (ROCEPHIN) 1 g in sodium chloride 0.9 % 100 mL IVPB     Discontinue     1 g 200 mL/hr over 30 Minutes Intravenous Every 24 hours 07/25/19 0710 07/30/19 0759          Objective:   Vitals:   07/25/19 2125 07/26/19 0048 07/26/19 0451 07/26/19 0733  BP: (!) 110/51 (!) 106/54 136/60 108/65  Pulse: 72 65 72 70  Resp: 18  18 18   Temp: 97.9 F (36.6 C) 99.4 F (37.4 C) 99.6 F (37.6 C) 98.3 F (36.8 C)  TempSrc: Oral Oral Oral Oral  SpO2: 97% 96% 95% 98%  Weight:   76.3 kg   Height:        SpO2: 98 %  Wt Readings from Last 3 Encounters:  07/26/19 76.3 kg  05/30/14 120.2 kg  12/07/13 84.4 kg     Intake/Output Summary (Last 24 hours) at 07/26/2019 1117 Last data filed at 07/26/2019 0459 Gross per 24 hour  Intake 204.89 ml  Output 550 ml  Net -345.11 ml     Physical Exam  Awake oriented x1, no deficits, [place].AT,PERRAL Supple Neck,No JVD, No cervical lymphadenopathy  appriciated.  Symmetrical Chest wall movement, Good air movement bilaterally, CTAB RRR,No Gallops, Rubs or new Murmurs, No Parasternal Heave +ve B.Sounds, Abd Soft, No tenderness, No organomegaly appriciated, No rebound - guarding or rigidity. No Cyanosis, L.AKA, L wrist splint    Data Review:    Recent Labs  Lab 07/24/19 1637 07/25/19 0447 07/26/19 0707  WBC 13.5* 10.5 10.1  HGB 15.0 12.6* 11.7*  HCT 47.2 39.2 36.4*  PLT 150 124* 121*  MCV 87.1 86.0 87.3  MCH 27.7 27.6 28.1  MCHC 31.8 32.1 32.1  RDW 13.3 13.2 13.3  LYMPHSABS  --   --  1.0  MONOABS  --   --  1.5*  EOSABS  --   --  0.2  BASOSABS  --   --  0.0    Recent Labs  Lab 07/24/19 1637 07/24/19 1917  07/24/19 1958 07/24/19 2228 07/25/19 0447 07/26/19 0707 07/26/19 0853  NA 138  --   --   --  138 139  --   K 4.9  --   --   --  3.9 3.8  --   CL 103  --   --   --  105 108  --   CO2 23  --   --   --  20* 22  --   GLUCOSE 170*  --   --   --  111* 85  --   BUN 31*  --   --   --  31* 28*  --   CREATININE 1.36*  --   --   --  1.04 0.96  --   CALCIUM 8.9  --   --   --  8.2* 8.1*  --   AST 50*  --   --   --  20 20  --   ALT 23  --   --   --  15 16  --   ALKPHOS 49  --   --   --  41 37*  --   BILITOT 2.1*  --   --   --  1.1 0.7  --   ALBUMIN 3.5  --   --   --  3.0* 2.6*  --   MG  --  1.7  --   --  2.2 2.0  --   PROCALCITON  --   --   --   --   --   --  <0.10  INR  --   --  1.2  --   --   --   --   TSH  --   --   --  3.835  --   --   --   HGBA1C  --   --  5.9*  --   --   --   --   BNP  --   --   --  144.9*  --  125.6*  --     Recent Labs  Lab 07/24/19 1856 07/24/19 2228 07/26/19 0707 07/26/19 0853  BNP  --  144.9* 125.6*  --   PROCALCITON  --   --   --  <0.10  SARSCOV2NAA NEGATIVE  --   --   --     ------------------------------------------------------------------------------------------------------------------ No results for input(s): CHOL, HDL, LDLCALC, TRIG, CHOLHDL, LDLDIRECT in the last 72 hours.  Lab Results  Component Value Date   HGBA1C 5.9 (H) 07/24/2019   ------------------------------------------------------------------------------------------------------------------ Recent Labs    07/24/19 2228  TSH 3.835   ------------------------------------------------------------------------------------------------------------------ No results for input(s): VITAMINB12,  FOLATE, FERRITIN, TIBC, IRON, RETICCTPCT in the last 72 hours.  Coagulation profile Recent Labs  Lab 07/24/19 1958  INR 1.2    No results for input(s): DDIMER in the last 72 hours.  Cardiac Enzymes No results for input(s): CKMB, TROPONINI, MYOGLOBIN in the last 168 hours.  Invalid input(s):  CK ------------------------------------------------------------------------------------------------------------------    Component Value Date/Time   BNP 125.6 (H) 07/26/2019 5643    Micro Results Recent Results (from the past 240 hour(s))  SARS Coronavirus 2 by RT PCR (hospital order, performed in [facility] hospital lab) Nasopharyngeal Nasopharyngeal Swab     Status: None   Collection Time: 07/24/19  6:56 PM   Specimen: Nasopharyngeal Swab  Result Value Ref Range Status   SARS Coronavirus 2 NEGATIVE NEGATIVE Final    Comment: (NOTE) SARS-CoV-2 target nucleic acids are NOT DETECTED.  The SARS-CoV-2 RNA is generally detectable in upper and lower respiratory specimens during the acute phase of infection. The lowest concentration of SARS-CoV-2 viral copies this assay can detect is 250 copies / mL. A negative result does not preclude SARS-CoV-2 infection and should not be used as the sole basis for treatment or other patient management decisions.  A negative result may occur with improper specimen collection / handling, submission of specimen other than nasopharyngeal swab, presence of viral mutation(s) within the areas targeted by this assay, and inadequate number of viral copies (<250 copies / mL). A negative result must be combined with clinical observations, patient history, and epidemiological information.  Fact Sheet for Patients:   BoilerBrush.com.cy  Fact Sheet for Healthcare Providers: https://pope.com/  This test is not yet approved or  cleared by the [place] FDA and has been authorized for detection and/or diagnosis of SARS-CoV-2 by FDA under an Emergency Use Authorization (EUA).  This EUA will remain in effect (meaning this test can be used) for the duration of the COVID-19 declaration under Section 564(b)(1) of the Act, 21 U.S.C. section 360bbb-3(b)(1), unless the authorization is terminated or revoked  sooner.  Performed at [facility], [address], [place], [place] [address]     Radiology Reports CT HEAD WO CONTRAST  Result Date: 07/25/2019 CLINICAL DATA:  Encephalopathy with loss of consciousness. EXAM: CT HEAD WITHOUT CONTRAST TECHNIQUE: Contiguous axial images were obtained from the base of the skull through the vertex without intravenous contrast. COMPARISON:  May 30, 2014 FINDINGS: Brain: Mild to moderate diffuse atrophy is stable. There is no intracranial mass, hemorrhage, extra-axial fluid collection, or midline shift. There has been previous inferior left occipital craniotomy with encephalomalacia throughout much of the left cerebellar hemisphere posteriorly, stable. There is periventricular small vessel disease throughout the centra semiovale bilaterally, stable. No acute infarct evident. Vascular: No hyperdense vessel. There is calcification in each carotid siphon region. Skull: Previous inferior left occipital craniotomy with bony defect in this area, stable. Bony calvarium otherwise intact. Sinuses/Orbits: There is mucosal thickening in several ethmoid air cells. There is a small air-fluid level in the left sphenoid sinus. Other paranasal sinuses are clear. Orbits appear symmetric bilaterally. Other: Mastoid air cells are clear. IMPRESSION: 1. Stable atrophy with periventricular small vessel disease. Extensive encephalomalacia with prior infarcts in the left cerebellum posteriorly, unchanged. No acute infarct. No mass or hemorrhage. 2. Previous inferior left occipital craniotomy, stable. No new bone lesion. 3.  Foci of arterial vascular calcification. 4.  Areas of paranasal sinus disease. Electronically Signed   By: Bretta Bang III M.D.   On: 07/25/2019 13:46   DG Chest  Port 1 View  Result Date: 07/24/2019 CLINICAL DATA:  Atrial fibrillation EXAM: PORTABLE CHEST 1 VIEW COMPARISON:  05/30/2014 FINDINGS: Cardiac shadow remains enlarged. Aortic calcifications are again  seen. The lungs are well aerated bilaterally. Minimal atelectatic changes are noted in the left lateral lung base. No bony abnormality is seen. IMPRESSION: Minimal left basilar atelectasis. No other focal abnormality is noted. Electronically Signed   By: Alcide Clever M.D.   On: 07/24/2019 17:23   EEG adult  Result Date: 07/25/2019 Rejeana Brock, MD     07/25/2019  9:38 PM Patient Name: Isak Sotomayor MRN: [id number] EEG Attending: Ritta Slot Referring Physician/Provider: Burney Gauze Date: 07/25/2019 Duration: 2 Patient history: 86 year old male with loss of consciousness, also previous history of posterior stroke with craniectomy Level of alertness: AEDs during EEG study: Technical aspects: This EEG study was done with scalp electrodes positioned according to the 10-20 International system of electrode placement. Electrical activity was acquired at a sampling rate of 500Hz  and reviewed with a high frequency filter of 70Hz  and a low frequency filter of 1Hz . EEG data were recorded continuously and digitally stored. BACKGROUND ACTIVITY: There is a posterior dominant rhythm of 8 Hz which is seen bilaterally, though slightly better appreciated on the left than the right (I suspect this is due to his previous craniectomy).  There is also mild diffuse generalized delta range activity throughout the recording. EPILEPTIFORM ACTIVITY: Interictal epileptiform activity: None Ictal Activity: None OTHER EVENTS: None SLEEP RECORDINGS: Only awake and drowsy states were recorded. Drowsiness was characterized by attenuation of the posterior background rhythm. ACTIVATION PROCEDURES: Hyperventilation and photic stimulation were not performed. IMPRESSION: This study is consistent with a mild generalized nonspecific cerebral dysfunction (encephalopathy).  there was no seizure or evidence of seizure predisposition recorded on this study. Please note that lack of epileptiform activity on EEG does not preclude the possibility  of epilepsy.  , MD [facility] [phone] If 7pm- 7am, please page neurology on call as listed in AMION.    Time Spent in minutes  30   M.D on 07/26/2019 at 11:17 AM  To page go to [email or web address] - password [facility]

## 2019-07-27 ENCOUNTER — Telehealth: Payer: Self-pay | Admitting: Cardiology

## 2019-07-27 ENCOUNTER — Encounter (HOSPITAL_COMMUNITY): Payer: Self-pay | Admitting: Family Medicine

## 2019-07-27 ENCOUNTER — Other Ambulatory Visit: Payer: Self-pay

## 2019-07-27 ENCOUNTER — Inpatient Hospital Stay (HOSPITAL_COMMUNITY): Payer: Medicare HMO

## 2019-07-27 DIAGNOSIS — I4891 Unspecified atrial fibrillation: Secondary | ICD-10-CM

## 2019-07-27 LAB — COMPREHENSIVE METABOLIC PANEL
ALT: 19 U/L (ref 0–44)
AST: 22 U/L (ref 15–41)
Albumin: 2.5 g/dL — ABNORMAL LOW (ref 3.5–5.0)
Alkaline Phosphatase: 37 U/L — ABNORMAL LOW (ref 38–126)
Anion gap: 9 (ref 5–15)
BUN: 22 mg/dL (ref 8–23)
CO2: 26 mmol/L (ref 22–32)
Calcium: 8.2 mg/dL — ABNORMAL LOW (ref 8.9–10.3)
Chloride: 106 mmol/L (ref 98–111)
Creatinine, Ser: 1.09 mg/dL (ref 0.61–1.24)
GFR calc Af Amer: >60 mL/min (ref >60)
GFR calc non Af Amer: 59 mL/min — ABNORMAL LOW (ref >60)
Glucose, Bld: 101 mg/dL — ABNORMAL HIGH (ref 70–99)
Potassium: 3.8 mmol/L (ref 3.5–5.1)
Sodium: 141 mmol/L (ref 135–145)
Total Bilirubin: 0.7 mg/dL (ref 0.3–1.2)
Total Protein: 6 g/dL — ABNORMAL LOW (ref 6.5–8.1)

## 2019-07-27 LAB — ECHOCARDIOGRAM COMPLETE
Height: 71 in
Weight: 2712.54 oz

## 2019-07-27 LAB — BRAIN NATRIURETIC PEPTIDE: B Natriuretic Peptide: 141.9 pg/mL — ABNORMAL HIGH (ref 0.0–100.0)

## 2019-07-27 LAB — GLUCOSE, CAPILLARY
Glucose-Capillary: 95 mg/dL (ref 70–99)
Glucose-Capillary: 168 mg/dL — ABNORMAL HIGH (ref 70–99)
Glucose-Capillary: 122 mg/dL — ABNORMAL HIGH (ref 70–99)
Glucose-Capillary: 144 mg/dL — ABNORMAL HIGH (ref 70–99)

## 2019-07-27 LAB — CBC WITH DIFFERENTIAL/PLATELET
Abs Immature Granulocytes: 0.05 10*3/uL (ref 0.00–0.07)
Basophils Absolute: 0.1 10*3/uL (ref 0.0–0.1)
Basophils Relative: 1 %
Eosinophils Absolute: 0.3 10*3/uL (ref 0.0–0.5)
Eosinophils Relative: 4 %
HCT: 36.9 % — ABNORMAL LOW (ref 39.0–52.0)
Hemoglobin: 11.8 g/dL — ABNORMAL LOW (ref 13.0–17.0)
Immature Granulocytes: 1 %
Lymphocytes Relative: 12 %
Lymphs Abs: 0.8 10*3/uL (ref 0.7–4.0)
MCH: 28.1 pg (ref 26.0–34.0)
MCHC: 32 g/dL (ref 30.0–36.0)
MCV: 87.9 fL (ref 80.0–100.0)
Monocytes Absolute: 1.1 10*3/uL — ABNORMAL HIGH (ref 0.1–1.0)
Monocytes Relative: 16 %
Neutro Abs: 4.3 10*3/uL (ref 1.7–7.7)
Neutrophils Relative %: 66 %
Platelets: 134 10*3/uL — ABNORMAL LOW (ref 150–400)
RBC: 4.2 MIL/uL — ABNORMAL LOW (ref 4.22–5.81)
RDW: 13.2 % (ref 11.5–15.5)
WBC: 6.6 10*3/uL (ref 4.0–10.5)
nRBC: 0 % (ref 0.0–0.2)

## 2019-07-27 LAB — MAGNESIUM: Magnesium: 1.9 mg/dL (ref 1.7–2.4)

## 2019-07-27 LAB — PROCALCITONIN: Procalcitonin: <0.1 ng/mL

## 2019-07-27 NOTE — TOC Initial Note (Signed)
Transition of Care [facility]) - Initial/Assessment Note    Patient Details  Name: [name] [name] MRN: [id number] Date of Birth: February 19, 1926  [facility] CM/SW Contact:    Kingsley Plan, RN Phone Number: 07/27/2019, 10:32 AM  Clinical Narrative:                  Patient from home with daughter [name] [name] [phone]. Discussed PT recommendation for SNF with [name] [name]. [name] [name] prefers to take her father home at discharge. She feels he will not be as confused at home.   She has no preference for home health agency , HHRN,PT,SW arranged with [place] with [facility].   Discussed DME needs. PAtient will need bedside commode with drop arm. Ordered with Zack with [employer] to deliver to room today, for daughter to take home.   Possible discharge tomorrow. Daughter requesting PTAR transport home, confirmed address.  Expected Discharge Plan: Home w Home Health Services Barriers to Discharge: Continued Medical Work up   Patient Goals and CMS Choice Patient states their goals for this hospitalization and ongoing recovery are:: to return tohome CMS Medicare.gov Compare Post Acute Care list provided to:: Patient Choice offered to / list presented to : Patient, Adult Children [name] [name] [phone])  Expected Discharge Plan and Services Expected Discharge Plan: Home w Home Health Services   Discharge Planning Services: CM Consult Post Acute Care Choice: Home Health, Durable Medical Equipment Living arrangements for the past 2 months: Single Family Home                 DME Arranged: 3-N-1 (drop arm bedside commode) DME Agency: [employer]                  Prior Living Arrangements/Services Living arrangements for the past 2 months: Single Family Home Lives with:: Adult Children              Current home services: DME    Activities of Daily Living      Permission Sought/Granted                  Emotional Assessment              Admission diagnosis:  Atrial  fibrillation with RVR (HCC) [I48.91] Patient Active Problem List   Diagnosis Date Noted  . Atrial fibrillation with RVR (HCC) 07/24/2019  . History of cerebellar hemorrhage 07/24/2019  . History of DVT (deep vein thrombosis) 07/24/2019  . Pain due to onychomycosis of toenails of both feet 12/26/2018  . PVD (peripheral vascular disease) (HCC) 12/26/2018  . Coagulation defect (HCC) 12/26/2018  . CVA (cerebral vascular accident) (HCC) 06/08/2014  . TIA (transient ischemic attack) 05/30/2014  . Wound infection 06/22/2013  . Cerebellar hemorrhage, acute (HCC) 05/14/2013  . Peripheral arterial disease (HCC)   . DVT (deep venous thrombosis) (HCC)   . Atrial fibrillation (HCC) 06/19/2010  . Long term (current) use of anticoagulants 06/19/2010   PCP:  Elias Else, MD Pharmacy:   [facility] [address] [place] - [address] [place] [address] Phone: [phone] Fax: [phone]  [facility] [address], [place] - [address] [place] [address] Phone: [phone] Fax: [phone]     Social Determinants of Health (SDOH) Interventions    Readmission Risk Interventions No flowsheet data found.

## 2019-07-27 NOTE — Progress Notes (Signed)
  Echocardiogram 2D Echocardiogram has been performed.  [name] [name] 07/27/2019, 9:01 AM

## 2019-07-27 NOTE — Progress Notes (Signed)
PROGRESS NOTE                                                                                                                                                                                                             Patient Demographics:    [name] BlamerJohn Ryser, is a 86 y.o. male, DOB - 07/19/1926, GNF:[id number]  Admit date - 07/24/2019   Admitting Physician Joselyn Arrowhelsea N Fair, MD  Outpatient Primary MD for the patient is Elias Elseeade, Robert, MD  LOS - 3  Chief Complaint  Patient presents with  . Loss of Consciousness       Brief Narrative - [name] [name] is a 86 y.o. 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 fib RVR.   Subjective:   Patient in bed appears to be in no distress, mildly confused but denies any headache, no chest or abdominal pain, no shortness of breath.   Assessment  & Plan :     1.  Acute encephalopathy with loss of consciousness likely due to A. fib RVR with hypoperfusion.  Stable CT head and EEG showing no acute changes except mild encephalopathy on EEG, pending echocardiogram.  He is currently mildly confused but overall much improved than the day of admission, has no subjective complaint at this time.  No focal deficits which are apparent.  Continue rate control and monitor studies. PT OT eval and advance activity.  2.  Chronic atrial fibrillation was in RVR.  [place] vas 2 score of greater than 3.  Not on anticoagulation due to previous history of cerebral hemorrhage requiring surgical evacuation, is on low-dose beta-blocker, have placed an order for baby aspirin with consent of the daughter, risks and benefits explained clearly, currently in rate control, stable TSH,  echocardiogram pending.  3.  History of cerebral hemorrhage requiring surgical evacuation and DVT.  Currently not on anticoagulation supportive care.  4.  PAD s/p left AKA.  Supportive care not on aspirin or statin.  5.  History of urethral stricture.  Urinary retention in the hospital, urology consulted and Foley placed this hospital.  Will likely go home on Foley catheter with outpatient urology follow-up.   BP low right now cannot add Flomax.  6. L. Wrist injury  -  has been seen & cleared by orthopedics a day before the admission, continue splint and supportive care.  7.  Stress related hyperglycemia.  A1c stable.  No DM type II.  Supportive care with sliding scale while he is in the hospital and monitor.  CBG (last 3)  Recent Labs    07/26/19 1644 07/26/19 2136 07/27/19 0758  GLUCAP 105* 112* 95   Lab Results  Component Value Date   HGBA1C 5.9 (H) 07/24/2019     Condition -  Guarded  Family Communication  :  Daughter [name] [phone] 07/25/2019 at 11:20 AM, 07/26/19, 07/27/2019  Code Status :  Full  Consults  : Urology  Procedures  :    TTE  CT head.  No acute bleed.   PUD Prophylaxis :   Disposition Plan  :    Status is: Inpatient  Remains inpatient appropriate because:Unsafe d/c plan   Dispo: The patient is from: Home              Anticipated d/c is to: Home              Anticipated d/c date is: 1 days              Patient currently is not medically stable to d/c.,  Syncope with A. fib RVR.  High fall risk, needs management of A. fib, rate control, PT eval.  DVT Prophylaxis  :   SCDs   Lab Results  Component Value Date   PLT 134 (L) 07/27/2019    Diet :  Diet Order            Diet Heart Room service appropriate? Yes; Fluid consistency: Thin  Diet effective now                  Inpatient Medications Scheduled Meds: . aspirin  81 mg Oral Daily  . Chlorhexidine Gluconate Cloth  6 each Topical Daily  . insulin aspart  0-9 Units  Subcutaneous Q4H  . lidocaine  1 application Urethral Once  . metoprolol tartrate  25 mg Oral BID  . pantoprazole  40 mg Oral Daily   Continuous Infusions: . cefTRIAXone (ROCEPHIN)  IV 1 g (07/27/19 0817)   PRN Meds:.  Antibiotics  :   Anti-infectives (From admission, onward)   Start     Dose/Rate Route Frequency Ordered Stop   07/25/19 0800  cefTRIAXone (ROCEPHIN) 1 g in sodium chloride 0.9 % 100 mL IVPB     Discontinue     1 g 200 mL/hr over 30 Minutes Intravenous Every 24 hours 07/25/19 0710 07/30/19 0759          Objective:   Vitals:   07/26/19 2110 07/26/19 2351 07/27/19 0436 07/27/19 0800  BP: 126/64 (!) 118/54 (!) 108/48 133/63  Pulse:  64 69 72  Resp: (!) 21 (!) 27 (!) 23 18  Temp: 99 F (37.2 C) 99.1 F (37.3 C) 98.9 F (37.2 C) 98 F (36.7 C)  TempSrc: Oral Oral Oral Oral  SpO2: 98% 96% 93% 96%  Weight:   76.9 kg   Height:        SpO2: 96 %  Wt Readings from Last 3 Encounters:  07/27/19 76.9 kg  05/30/14 120.2 kg  12/07/13 84.4 kg     Intake/Output Summary (Last 24 hours) at 07/27/2019 0941 Last data filed at 07/26/2019 2300 Gross per 24 hour  Intake 702 ml  Output 1200 ml  Net -498 ml     Physical Exam  Awake, pleasantly confused with no new focal deficits, moving all 4 extremities well including left AKA stump [place].AT,PERRAL Supple Neck,No JVD, No cervical lymphadenopathy appriciated.  Symmetrical Chest wall movement, Good air movement bilaterally, CTAB RRR,No Gallops, Rubs or new Murmurs, No Parasternal Heave +ve B.Sounds, Abd Soft, No tenderness, No organomegaly appriciated, No rebound - guarding or rigidity. L.AKA, L wrist splint    Data Review:    Recent Labs  Lab 07/24/19 1637 07/25/19 0447 07/26/19 0707 07/27/19 0533  WBC 13.5* 10.5 10.1 6.6  HGB 15.0 12.6* 11.7* 11.8*  HCT 47.2 39.2 36.4* 36.9*  PLT 150 124* 121* 134*  MCV 87.1 86.0 87.3 87.9  MCH 27.7 27.6 28.1 28.1  MCHC 31.8 32.1 32.1 32.0  RDW 13.3 13.2 13.3 13.2   LYMPHSABS  --   --  1.0 0.8  MONOABS  --   --  1.5* 1.1*  EOSABS  --   --  0.2 0.3  BASOSABS  --   --  0.0 0.1    Recent Labs  Lab 07/24/19 1637 07/24/19 1917 07/24/19 1958 07/24/19 2228 07/25/19 0447 07/26/19 0707 07/26/19 0853 07/27/19 0533  NA 138  --   --   --  138 139  --  141  K 4.9  --   --   --  3.9 3.8  --  3.8  CL 103  --   --   --  105 108  --  106  CO2 23  --   --   --  20* 22  --  26  GLUCOSE 170*  --   --   --  111* 85  --  101*  BUN 31*  --   --   --  31* 28*  --  22  CREATININE 1.36*  --   --   --  1.04 0.96  --  1.09  CALCIUM 8.9  --   --   --  8.2* 8.1*  --  8.2*  AST 50*  --   --   --  20 20  --  22  ALT 23  --   --   --  15 16  --  19  ALKPHOS 49  --   --   --  41 37*  --  37*  BILITOT 2.1*  --   --   --  1.1 0.7  --  0.7  ALBUMIN 3.5  --   --   --  3.0* 2.6*  --  2.5*  MG  --  1.7  --   --  2.2 2.0  --  1.9  PROCALCITON  --   --   --   --   --   --  <0.10 <0.10  INR  --   --  1.2  --   --   --   --   --   TSH  --   --   --  3.835  --   --   --   --   HGBA1C  --   --  5.9*  --   --   --   --   --   BNP  --   --   --  144.9*  --  125.6*  --  141.9*    Recent Labs  Lab 07/24/19 1856 07/24/19 2228 07/26/19 0707 07/26/19 0853 07/27/19 0533  BNP  --  144.9* 125.6*  --  141.9*  PROCALCITON  --   --   --  <  0.10 <0.10  SARSCOV2NAA NEGATIVE  --   --   --   --     ------------------------------------------------------------------------------------------------------------------ No results for input(s): CHOL, HDL, LDLCALC, TRIG, CHOLHDL, LDLDIRECT in the last 72 hours.  Lab Results  Component Value Date   HGBA1C 5.9 (H) 07/24/2019   ------------------------------------------------------------------------------------------------------------------ Recent Labs    07/24/19 2228  TSH 3.835   ------------------------------------------------------------------------------------------------------------------ No results for input(s): VITAMINB12, FOLATE,  FERRITIN, TIBC, IRON, RETICCTPCT in the last 72 hours.  Coagulation profile Recent Labs  Lab 07/24/19 1958  INR 1.2    No results for input(s): DDIMER in the last 72 hours.  Cardiac Enzymes No results for input(s): CKMB, TROPONINI, MYOGLOBIN in the last 168 hours.  Invalid input(s): CK ------------------------------------------------------------------------------------------------------------------    Component Value Date/Time   BNP 141.9 (H) 07/27/2019 0533    Micro Results Recent Results (from the past 240 hour(s))  SARS Coronavirus 2 by RT PCR (hospital order, performed in [facility] hospital lab) Nasopharyngeal Nasopharyngeal Swab     Status: None   Collection Time: 07/24/19  6:56 PM   Specimen: Nasopharyngeal Swab  Result Value Ref Range Status   SARS Coronavirus 2 NEGATIVE NEGATIVE Final    Comment: (NOTE) SARS-CoV-2 target nucleic acids are NOT DETECTED.  The SARS-CoV-2 RNA is generally detectable in upper and lower respiratory specimens during the acute phase of infection. The lowest concentration of SARS-CoV-2 viral copies this assay can detect is 250 copies / mL. A negative result does not preclude SARS-CoV-2 infection and should not be used as the sole basis for treatment or other patient management decisions.  A negative result may occur with improper specimen collection / handling, submission of specimen other than nasopharyngeal swab, presence of viral mutation(s) within the areas targeted by this assay, and inadequate number of viral copies (<250 copies / mL). A negative result must be combined with clinical observations, patient history, and epidemiological information.  Fact Sheet for Patients:   StrictlyIdeas.no  Fact Sheet for Healthcare Providers: BankingDealers.co.za  This test is not yet approved or  cleared by the [place] FDA and has been authorized for detection and/or diagnosis of  SARS-CoV-2 by FDA under an Emergency Use Authorization (EUA).  This EUA will remain in effect (meaning this test can be used) for the duration of the COVID-19 declaration under Section 564(b)(1) of the Act, 21 U.S.C. section 360bbb-3(b)(1), unless the authorization is terminated or revoked sooner.  Performed at [facility], [place] [address] [place], [place] [address]     Radiology Reports CT HEAD WO CONTRAST  Result Date: 07/25/2019 CLINICAL DATA:  Encephalopathy with loss of consciousness. EXAM: CT HEAD WITHOUT CONTRAST TECHNIQUE: Contiguous axial images were obtained from the base of the skull through the vertex without intravenous contrast. COMPARISON:  May 30, 2014 FINDINGS: Brain: Mild to moderate diffuse atrophy is stable. There is no intracranial mass, hemorrhage, extra-axial fluid collection, or midline shift. There has been previous inferior left occipital craniotomy with encephalomalacia throughout much of the left cerebellar hemisphere posteriorly, stable. There is periventricular small vessel disease throughout the centra semiovale bilaterally, stable. No acute infarct evident. Vascular: No hyperdense vessel. There is calcification in each carotid siphon region. Skull: Previous inferior left occipital craniotomy with bony defect in this area, stable. Bony calvarium otherwise intact. Sinuses/Orbits: There is mucosal thickening in several ethmoid air cells. There is a small air-fluid level in the left sphenoid sinus. Other paranasal sinuses are clear. Orbits appear symmetric bilaterally. Other: Mastoid air cells are clear. IMPRESSION:  1. Stable atrophy with periventricular small vessel disease. Extensive encephalomalacia with prior infarcts in the left cerebellum posteriorly, unchanged. No acute infarct. No mass or hemorrhage. 2. Previous inferior left occipital craniotomy, stable. No new bone lesion. 3.  Foci of arterial vascular calcification. 4.  Areas of paranasal sinus disease.  Electronically Signed   By: Bretta Bang III M.D.   On: 07/25/2019 13:46   DG Chest Port 1 View  Result Date: 07/24/2019 CLINICAL DATA:  Atrial fibrillation EXAM: PORTABLE CHEST 1 VIEW COMPARISON:  05/30/2014 FINDINGS: Cardiac shadow remains enlarged. Aortic calcifications are again seen. The lungs are well aerated bilaterally. Minimal atelectatic changes are noted in the left lateral lung base. No bony abnormality is seen. IMPRESSION: Minimal left basilar atelectasis. No other focal abnormality is noted. Electronically Signed   By: Alcide Clever M.D.   On: 07/24/2019 17:23   EEG adult  Result Date: 07/25/2019 Rejeana Brock, MD     07/25/2019  9:38 PM Patient Name: Phillippe Orlick MRN: [id number] EEG Attending: Ritta Slot Referring Physician/Provider: Burney Gauze Date: 07/25/2019 Duration: 2 Patient history: 86 year old male with loss of consciousness, also previous history of posterior stroke with craniectomy Level of alertness: AEDs during EEG study: Technical aspects: This EEG study was done with scalp electrodes positioned according to the 10-20 International system of electrode placement. Electrical activity was acquired at a sampling rate of 500Hz  and reviewed with a high frequency filter of 70Hz  and a low frequency filter of 1Hz . EEG data were recorded continuously and digitally stored. BACKGROUND ACTIVITY: There is a posterior dominant rhythm of 8 Hz which is seen bilaterally, though slightly better appreciated on the left than the right (I suspect this is due to his previous craniectomy).  There is also mild diffuse generalized delta range activity throughout the recording. EPILEPTIFORM ACTIVITY: Interictal epileptiform activity: None Ictal Activity: None OTHER EVENTS: None SLEEP RECORDINGS: Only awake and drowsy states were recorded. Drowsiness was characterized by attenuation of the posterior background rhythm. ACTIVATION PROCEDURES: Hyperventilation and photic stimulation were not  performed. IMPRESSION: This study is consistent with a mild generalized nonspecific cerebral dysfunction (encephalopathy).  there was no seizure or evidence of seizure predisposition recorded on this study. Please note that lack of epileptiform activity on EEG does not preclude the possibility of epilepsy.  , MD [facility] [phone] If 7pm- 7am, please page neurology on call as listed in AMION.    Time Spent in minutes  30   M.D on 07/27/2019 at 9:41 AM  To page go to [email or web address] - password [facility]

## 2019-07-27 NOTE — Telephone Encounter (Signed)
I didn't call patient because patient has been admitted to the hospital and will follow up in couple days to see if I can get this patient scheduled for follow up visit. Trying to give patient opportunity to be discharged

## 2019-07-28 LAB — GLUCOSE, CAPILLARY: Glucose-Capillary: 161 mg/dL — ABNORMAL HIGH (ref 70–99)

## 2019-07-28 MED ORDER — ASPIRIN 81 MG PO CHEW: 81.0000 mg | CHEWABLE_TABLET | Freq: Every day | ORAL | 0 refills | Status: DC

## 2019-07-28 MED ORDER — PANTOPRAZOLE SODIUM 40 MG PO TBEC: 40.0000 mg | DELAYED_RELEASE_TABLET | Freq: Every day | ORAL | 0 refills | Status: AC

## 2019-07-28 MED ORDER — METOPROLOL TARTRATE 25 MG PO TABS: 25.0000 mg | ORAL_TABLET | Freq: Two times a day (BID) | ORAL | 0 refills | Status: DC

## 2019-07-28 MED ORDER — CEPHALEXIN 500 MG PO CAPS: 500.0000 mg | ORAL_CAPSULE | Freq: Three times a day (TID) | ORAL | 0 refills | Status: AC

## 2019-07-28 NOTE — Discharge Instructions (Signed)
Follow with Primary MD Elias Else, MD in 7 days   Get CBC, CMP, 2 view Chest X ray -  checked next visit within 1 week by Primary MD    Activity: As tolerated with Full fall precautions use walker/cane & assistance as needed  Disposition Home    Diet: Heart Healthy    Special Instructions: If you have smoked or chewed Tobacco  in the last 2 yrs please stop smoking, stop any regular Alcohol  and or any Recreational drug use.  On your next visit with your primary care physician please Get Medicines reviewed and adjusted.  Please request your Prim.MD to go over all Hospital Tests and Procedure/Radiological results at the follow up, please get all Hospital records sent to your Prim MD by signing hospital release before you go home.  If you experience worsening of your admission symptoms, develop shortness of breath, life threatening emergency, suicidal or homicidal thoughts you must seek medical attention immediately by calling 911 or calling your MD immediately  if symptoms less severe.  You Must read complete instructions/literature along with all the possible adverse reactions/side effects for all the Medicines you take and that have been prescribed to you. Take any new Medicines after you have completely understood and accpet all the possible adverse reactions/side effects.

## 2019-07-28 NOTE — Progress Notes (Signed)
Patient d/ced via [facility].  Discharge Instructions and foley care reviewed with patient and daughter, daughter verbalized understanding.  Pericare and foley care done prior to d/c.

## 2019-07-28 NOTE — TOC Transition Note (Signed)
Transition of Care [facility]) - CM/SW Discharge Note   Patient Details  Name: [name] [name] MRN: [id number] Date of Birth: Jun 04, 1926  Transition of Care [facility]) CM/SW Contact:  Terrial Rhodes, LCSWA Phone Number: 07/28/2019, 9:47 AM   Clinical Narrative:      Patient will DC to: [address] [place] [address]  Anticipated DC date: 07/28/2019  Family notified: [name]   Transport by: Sharin Mons   ?  Per MD patient ready for DC to home with home health services . RN, patient, patient's family, notified of DC. DC packet on chart. Ambulance transport requested for patient.  CSW signing off.  Final next level of care: Home w Home Health Services Barriers to Discharge: No Barriers Identified   Patient Goals and CMS Choice Patient states their goals for this hospitalization and ongoing recovery are:: to go home with home health services CMS Medicare.gov Compare Post Acute Care list provided to:: Other (Comment Required) (daughter [name]) Choice offered to / list presented to : Adult Children [name])  Discharge Placement                Patient to be transferred to facility by: [facility] Name of family member notified: [name] Patient and family notified of of transfer: 07/28/19  Discharge Plan and Services   Discharge Planning Services: CM Consult Post Acute Care Choice: Home Health, Durable Medical Equipment          DME Arranged: 3-N-1 (drop arm bedside commode) DME Agency: [employer]                  Social Determinants of Health (SDOH) Interventions     Readmission Risk Interventions No flowsheet data found.

## 2019-07-28 NOTE — Discharge Summary (Signed)
[name] BlamerJohn [name] ZOX:[id number] DOB: 09/17/1926 DOA: 07/24/2019  PCP: [name] Elseeade, Robert, MD  Admit date: 07/24/2019  Discharge date: 07/28/2019  Admitted From: Home   Disposition:  Home   Recommendations for Outpatient Follow-up:   Follow up with PCP in 1-2 weeks  PCP Please obtain BMP/CBC, 2 view CXR in 1week,  (see Discharge instructions)   PCP Please follow up on the following pending results: Review echo report, needs outpatient urology and cardiology follow-up.   Home Health: PT,RN   Equipment/Devices: None  Consultations: None  Discharge Condition: Stable    CODE STATUS: Full    Diet Recommendation: Heart Healthy   Diet Order            Diet - low sodium heart healthy           Diet Heart Room service appropriate? Yes; Fluid consistency: Thin  Diet effective now                  Chief Complaint  Patient presents with  . 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 fib RVR.                                                                  Hospital Course   1.  Acute encephalopathy with loss of consciousness likely due to A. fib RVR with hypoperfusion.  Stable CT head and EEG showing no acute changes except mild encephalopathy on EEG, pending echocardiogram.  He is currently mildly confused but overall much improved than the day of admission, has no subjective  complaint at this time.  No focal deficits which are apparent.  Continue rate control and monitor studies. PT OT eval and advance activity.  2.  Chronic atrial fibrillation was in RVR.  [place] vas 2 score of greater than 3.  Not on anticoagulation due to previous history of cerebral hemorrhage requiring surgical evacuation, is on low-dose beta-blocker, have placed an order for baby aspirin with consent of the daughter, risks and benefits explained clearly, currently in rate control, stable TSH,  echo shows a EF of 65%.  3.  History of cerebral hemorrhage requiring surgical evacuation and DVT.  Currently not on anticoagulation supportive care.  4.  PAD s/p left AKA.  Supportive care not on aspirin or statin.  5.  History of urethral stricture.  Urinary retention in the hospital, urology consulted and Foley  placed this hospital.  Will likely go home on Foley catheter with outpatient urology follow-up. Blood pressure too low for Flomax.  6. L. Wrist injury  - has been seen & cleared by orthopedics a day before the admission, continue splint and supportive care.  7.  Stress related hyperglycemia.  A1c stable.  No DM type II.    8.  Incidental echo finding of symmetrical density that appears to originate from the interatrial septum and extends into the right atrial cavity. Unclear if this represents lipomatous hypertrophy or possible myxoma.  No infection hence doubt this is vegetation, will have him follow with his primary cardiologist.  Not a candidate for any invasive procedure whatsoever.  Discharge diagnosis     Principal Problem:   Atrial fibrillation with RVR (HCC) Active Problems:   Peripheral arterial disease (HCC)   PVD (peripheral vascular disease) (HCC)   History of cerebellar hemorrhage   History of DVT (deep vein thrombosis)    Discharge instructions    Discharge Instructions    Diet - low sodium heart healthy   Complete by: As directed    Discharge instructions    Complete by: As directed    Follow with Primary MD [name] Else, MD in 7 days   Get CBC, CMP, 2 view Chest X ray -  checked next visit within 1 week by Primary MD    Activity: As tolerated with Full fall precautions use walker/cane & assistance as needed  Disposition Home    Diet: Heart Healthy    Special Instructions: If you have smoked or chewed Tobacco  in the last 2 yrs please stop smoking, stop any regular Alcohol  and or any Recreational drug use.  On your next visit with your primary care physician please Get Medicines reviewed and adjusted.  Please request your Prim.MD to go over all Hospital Tests and Procedure/Radiological results at the follow up, please get all Hospital records sent to your Prim MD by signing hospital release before you go home.  If you experience worsening of your admission symptoms, develop shortness of breath, life threatening emergency, suicidal or homicidal thoughts you must seek medical attention immediately by calling 911 or calling your MD immediately  if symptoms less severe.  You Must read complete instructions/literature along with all the possible adverse reactions/side effects for all the Medicines you take and that have been prescribed to you. Take any new Medicines after you have completely understood and accpet all the possible adverse reactions/side effects.   Increase activity slowly   Complete by: As directed       Discharge Medications   Allergies as of 07/28/2019      Reactions   Coumadin [warfarin Sodium] Other (See Comments)   Caused bleeding in brain      Medication List    TAKE these medications   aspirin 81 MG chewable tablet Chew 1 tablet (81 mg total) by mouth daily. Start taking on: July 29, 2019   cephALEXin 500 MG capsule Commonly known as: KEFLEX Take 1 capsule (500 mg total) by mouth 3 (three) times daily for 3 days.   Desitin Oint Apply 1 application topically daily as needed (Breakouts on ankle).    metoprolol tartrate 25 MG tablet Commonly known as: LOPRESSOR Take 1 tablet (25 mg total) by mouth 2 (two) times daily.   multivitamin with minerals Tabs tablet Take 1 tablet by mouth every evening. [employer]   pantoprazole 40 MG tablet Commonly known as: PROTONIX Take 1 tablet (  40 mg total) by mouth daily. Start taking on: July 29, 2019   Voltaren 1 % Gel Generic drug: diclofenac Sodium Apply 1 application topically in the morning and at bedtime. Apply sparingly.            Durable Medical Equipment  (From admission, onward)         Start     Ordered   07/27/19 1022  For home use only DME 3 n 1  Once       Comments: Bedside commode with drop arm   07/27/19 1022           Follow-up Information    [facility] Follow up.   Specialty: Home Health Services Why: provide home health  Contact information: [place] [address] [phone]        Maury Dus, MD. Schedule an appointment as soon as possible for a visit in 1 week(s).   Specialty: Family Medicine Contact information: [place] [address] [phone]        [place], Peter M, MD. Schedule an appointment as soon as possible for a visit in 1 week(s).   Specialty: Cardiology Why: Get echo report reviewed. Contact information: [address] [place] [address] [phone]        Franchot Gallo, MD. Schedule an appointment as soon as possible for a visit in 1 week(s).   Specialty: Urology Why: Urinary retention with Foley catheter Contact information: [place] [address] [phone]               Major procedures and Radiology Reports - PLEASE review detailed and final reports thoroughly  -       CT HEAD WO CONTRAST  Result Date: 07/25/2019 CLINICAL DATA:  Encephalopathy with loss of consciousness. EXAM: CT HEAD WITHOUT CONTRAST TECHNIQUE: Contiguous axial images  were obtained from the base of the skull through the vertex without intravenous contrast. COMPARISON:  May 30, 2014 FINDINGS: Brain: Mild to moderate diffuse atrophy is stable. There is no intracranial mass, hemorrhage, extra-axial fluid collection, or midline shift. There has been previous inferior left occipital craniotomy with encephalomalacia throughout much of the left cerebellar hemisphere posteriorly, stable. There is periventricular small vessel disease throughout the centra semiovale bilaterally, stable. No acute infarct evident. Vascular: No hyperdense vessel. There is calcification in each carotid siphon region. Skull: Previous inferior left occipital craniotomy with bony defect in this area, stable. Bony calvarium otherwise intact. Sinuses/Orbits: There is mucosal thickening in several ethmoid air cells. There is a small air-fluid level in the left sphenoid sinus. Other paranasal sinuses are clear. Orbits appear symmetric bilaterally. Other: Mastoid air cells are clear. IMPRESSION: 1. Stable atrophy with periventricular small vessel disease. Extensive encephalomalacia with prior infarcts in the left cerebellum posteriorly, unchanged. No acute infarct. No mass or hemorrhage. 2. Previous inferior left occipital craniotomy, stable. No new bone lesion. 3.  Foci of arterial vascular calcification. 4.  Areas of paranasal sinus disease. Electronically Signed   By: Lowella Grip III M.D.   On: 07/25/2019 13:46   DG Chest Port 1 View  Result Date: 07/24/2019 CLINICAL DATA:  Atrial fibrillation EXAM: PORTABLE CHEST 1 VIEW COMPARISON:  05/30/2014 FINDINGS: Cardiac shadow remains enlarged. Aortic calcifications are again seen. The lungs are well aerated bilaterally. Minimal atelectatic changes are noted in the left lateral lung base. No bony abnormality is seen. IMPRESSION: Minimal left basilar atelectasis. No other focal abnormality  is noted. Electronically Signed   By: Alcide Clever M.D.   On: 07/24/2019  17:23   EEG adult  Result Date: 07/25/2019 Rejeana Brock, MD     07/25/2019  9:38 PM Patient Name: Zuhayr Deeney MRN: [id number] EEG Attending: Ritta Slot Referring Physician/Provider: Burney Gauze Date: 07/25/2019 Duration: 2 Patient history: 86 year old male with loss of consciousness, also previous history of posterior stroke with craniectomy Level of alertness: AEDs during EEG study: Technical aspects: This EEG study was done with scalp electrodes positioned according to the 10-20 International system of electrode placement. Electrical activity was acquired at a sampling rate of 500Hz  and reviewed with a high frequency filter of 70Hz  and a low frequency filter of 1Hz . EEG data were recorded continuously and digitally stored. BACKGROUND ACTIVITY: There is a posterior dominant rhythm of 8 Hz which is seen bilaterally, though slightly better appreciated on the left than the right (I suspect this is due to his previous craniectomy).  There is also mild diffuse generalized delta range activity throughout the recording. EPILEPTIFORM ACTIVITY: Interictal epileptiform activity: None Ictal Activity: None OTHER EVENTS: None SLEEP RECORDINGS: Only awake and drowsy states were recorded. Drowsiness was characterized by attenuation of the posterior background rhythm. ACTIVATION PROCEDURES: Hyperventilation and photic stimulation were not performed. IMPRESSION: This study is consistent with a mild generalized nonspecific cerebral dysfunction (encephalopathy).  there was no seizure or evidence of seizure predisposition recorded on this study. Please note that lack of epileptiform activity on EEG does not preclude the possibility of epilepsy.  , MD [facility] [phone] If 7pm- 7am, please page neurology on call as listed in AMION.   ECHOCARDIOGRAM COMPLETE  Result Date: 07/27/2019    ECHOCARDIOGRAM REPORT   Patient Name:   [name] [name] Date of Exam: 07/27/2019 Medical Rec #:   07/29/2019   Height:       71.0 in Accession #:    [name] [name]  Weight:       169.5 lb Date of Birth:  [birth year]    BSA:          1.966 m Patient Age:    86 years    BP:           133/63 mmHg Patient Gender: M           HR:           81 bpm. Exam Location:  Inpatient Procedure: 2D Echo Indications:    Atrail Fibrillation I48.91  History:        Patient has prior history of Echocardiogram examinations, most                 recent 05/31/2014.  Sonographer:    [id number] RDCS (AE) Referring Phys: [id number] LAURA R INGOLD IMPRESSIONS  1. Left ventricular ejection fraction, by estimation, is 60 to 65%. The left ventricle has normal function. The left ventricle has no regional wall motion abnormalities. There is mild left ventricular hypertrophy of the basal-septal segment. Left ventricular diastolic parameters are consistent with Grade I diastolic dysfunction (impaired relaxation).  2. Right ventricular systolic function is normal. The right ventricular size is normal. Tricuspid regurgitation signal is inadequate for assessing PA pressure.  3. The mitral valve is normal in structure. No evidence of mitral valve regurgitation. No evidence of mitral stenosis.  4. The aortic valve is tricuspid. Aortic valve regurgitation is trivial. Mild to moderate aortic valve sclerosis/calcification is present, without any evidence of aortic stenosis.  5. The inferior vena cava is normal  in size with greater than 50% respiratory variability, suggesting right atrial pressure of 3 mmHg.  6. There is a symmetrical density that appears to originate from the interatrial septum and extends into the right atrial cavity. Unclear if this represents lipomatous hypertrophy or possible myxoma.  7. Consider TEE if clinically indicated. FINDINGS  Left Ventricle: Left ventricular ejection fraction, by estimation, is 60 to 65%. The left ventricle has normal function. The left ventricle has no regional wall motion abnormalities. The left ventricular  internal cavity size was normal in size. There is  mild left ventricular hypertrophy of the basal-septal segment. Left ventricular diastolic parameters are consistent with Grade I diastolic dysfunction (impaired relaxation). Normal left ventricular filling pressure. Right Ventricle: The right ventricular size is normal. No increase in right ventricular wall thickness. Right ventricular systolic function is normal. Tricuspid regurgitation signal is inadequate for assessing PA pressure. Left Atrium: Left atrial size was normal in size. Right Atrium: Right atrial size was normal in size. Pericardium: There is no evidence of pericardial effusion. Mitral Valve: The mitral valve is normal in structure. Normal mobility of the mitral valve leaflets. No evidence of mitral valve regurgitation. No evidence of mitral valve stenosis. Tricuspid Valve: The tricuspid valve is normal in structure. Tricuspid valve regurgitation is not demonstrated. No evidence of tricuspid stenosis. Aortic Valve: The aortic valve is tricuspid. Aortic valve regurgitation is trivial. Mild to moderate aortic valve sclerosis/calcification is present, without any evidence of aortic stenosis. Mild to moderate aortic valve annular calcification. Pulmonic Valve: The pulmonic valve was normal in structure. Pulmonic valve regurgitation is not visualized. No evidence of pulmonic stenosis. Aorta: The aortic root is normal in size and structure. Venous: The inferior vena cava is normal in size with greater than 50% respiratory variability, suggesting right atrial pressure of 3 mmHg. IAS/Shunts: No atrial level shunt detected by color flow Doppler.  LEFT VENTRICLE PLAX 2D LVIDd:         3.40 cm  Diastology LVIDs:         2.40 cm  LV e' lateral:   7.42 cm/s LV PW:         1.10 cm  LV E/e' lateral: 7.7 LV IVS:        1.20 cm  LV e' medial:    5.54 cm/s LVOT diam:     2.30 cm  LV E/e' medial:  10.4 LV SV:         65 LV SV Index:   33 LVOT Area:     4.15 cm  RIGHT  VENTRICLE RV S prime:     12.10 cm/s LEFT ATRIUM           Index       RIGHT ATRIUM          Index LA diam:      2.70 cm 1.37 cm/m  RA Area:     8.16 cm LA Vol (A2C): 31.3 ml 15.92 ml/m RA Volume:   11.70 ml 5.95 ml/m LA Vol (A4C): 35.8 ml 18.21 ml/m  AORTIC VALVE LVOT Vmax:   72.70 cm/s LVOT Vmean:  51.000 cm/s LVOT VTI:    0.156 m  AORTA Ao Root diam: 3.50 cm MITRAL VALVE MV Area (PHT): 2.83 cm    SHUNTS MV Decel Time: 268 msec    Systemic VTI:  0.16 m MV E velocity: 57.50 cm/s  Systemic Diam: 2.30 cm MV A velocity: 69.80 cm/s MV E/A ratio:  0.82 Armanda Magic MD Electronically signed by Armanda Magic  MD Signature Date/Time: 07/27/2019/4:43:03 PM    Final     Micro Results     Recent Results (from the past 240 hour(s))  SARS Coronavirus 2 by RT PCR (hospital order, performed in [facility] hospital lab) Nasopharyngeal Nasopharyngeal Swab     Status: None   Collection Time: 07/24/19  6:56 PM   Specimen: Nasopharyngeal Swab  Result Value Ref Range Status   SARS Coronavirus 2 NEGATIVE NEGATIVE Final    Comment: (NOTE) SARS-CoV-2 target nucleic acids are NOT DETECTED.  The SARS-CoV-2 RNA is generally detectable in upper and lower respiratory specimens during the acute phase of infection. The lowest concentration of SARS-CoV-2 viral copies this assay can detect is 250 copies / mL. A negative result does not preclude SARS-CoV-2 infection and should not be used as the sole basis for treatment or other patient management decisions.  A negative result may occur with improper specimen collection / handling, submission of specimen other than nasopharyngeal swab, presence of viral mutation(s) within the areas targeted by this assay, and inadequate number of viral copies (<250 copies / mL). A negative result must be combined with clinical observations, patient history, and epidemiological information.  Fact Sheet for Patients:   BoilerBrush.com.[name]  Fact Sheet for  Healthcare Providers: https://pope.com/  This test is not yet approved or  cleared by the [place] FDA and has been authorized for detection and/or diagnosis of SARS-CoV-2 by FDA under an Emergency Use Authorization (EUA).  This EUA will remain in effect (meaning this test can be used) for the duration of the COVID-19 declaration under Section 564(b)(1) of the Act, 21 U.S.C. section 360bbb-3(b)(1), unless the authorization is terminated or revoked sooner.  Performed at [facility], [address] [place], [place] [address]     Today   Subjective    [name] [name] today has no headache,no chest abdominal pain,no new weakness tingling or numbness, feels much better wants to go home today.     Objective   Blood pressure 119/63, pulse 72, temperature 99 F (37.2 C), temperature source Oral, resp. rate (!) 22, height 5\' 11"  (1.803 m), weight 77.7 kg, SpO2 97 %.   Intake/Output Summary (Last 24 hours) at 07/28/2019 0914 Last data filed at 07/28/2019 0800 Gross per 24 hour  Intake 920.94 ml  Output 945 ml  Net -24.06 ml    Exam  Awake , mildly confused, No new F.N deficits,   [place].AT,PERRAL Supple Neck,No JVD, No cervical lymphadenopathy appriciated.  Symmetrical Chest wall movement, Good air movement bilaterally, CTAB RRR,No Gallops,Rubs or new Murmurs, No Parasternal Heave +ve B.Sounds, Abd Soft, Non tender, No organomegaly appriciated, No rebound -guarding or rigidity. No Cyanosis, Clubbing or edema, splint in the left wrist   Data Review   CBC w Diff:  Lab Results  Component Value Date   WBC 6.6 07/27/2019   HGB 11.8 (L) 07/27/2019   HCT 36.9 (L) 07/27/2019   PLT 134 (L) 07/27/2019   LYMPHOPCT 12 07/27/2019   MONOPCT 16 07/27/2019   EOSPCT 4 07/27/2019   BASOPCT 1 07/27/2019    CMP:  Lab Results  Component Value Date   NA 141 07/27/2019   K 3.8 07/27/2019   CL 106 07/27/2019   CO2 26 07/27/2019   BUN 22 07/27/2019    CREATININE 1.09 07/27/2019   PROT 6.0 (L) 07/27/2019   ALBUMIN 2.5 (L) 07/27/2019   BILITOT 0.7 07/27/2019   ALKPHOS 37 (L) 07/27/2019   AST 22 07/27/2019   ALT  19 07/27/2019  .   Total Time in preparing paper work, data evaluation and todays exam - 35 minutes  Susa Raring M.D on 07/28/2019 at 9:14 AM  [facility]   Office  [phone]

## 2019-07-28 NOTE — Plan of Care (Signed)
  Problem: Clinical Measurements: Goal: Ability to maintain clinical measurements within normal limits will improve Outcome: Progressing Goal: Will remain free from infection Outcome: Progressing Goal: Diagnostic test results will improve Outcome: Progressing Goal: Respiratory complications will improve Outcome: Progressing Goal: Cardiovascular complication will be avoided Outcome: Progressing   Problem: Nutrition: Goal: Adequate nutrition will be maintained Outcome: Progressing   Problem: Coping: Goal: Level of anxiety will decrease Outcome: Completed/Met   Problem: Elimination: Goal: Will not experience complications related to bowel motility Outcome: Completed/Met Last BM 07/27/19   Problem: Pain Managment: Goal: General experience of comfort will improve Outcome: Completed/Met Pt denies c/o pain or other discomfort   Problem: Education: Goal: Knowledge of General Education information will improve Description: Including pain rating scale, medication(s)/side effects and non-pharmacologic comfort measures Outcome: Not Applicable Pt with cognitive impairment-daughter POA    Problem: Health Behavior/Discharge Planning: Goal: Ability to manage health-related needs will improve Outcome: Not Applicable Pt unable to manage health needs-daughter provides care at home   Problem: Elimination: Goal: Will not experience complications related to urinary retention Outcome: Not Applicable Coude cath placed by urology, pt will discharge with foley

## 2019-07-28 NOTE — Care Management Important Message (Signed)
Important Message  Patient Details  Name: [name] [name] MRN: [id number] Date of Birth: [birth year]   Medicare Important Message Given:  Yes     Renie Ora 07/28/2019, 10:39 AM

## 2019-07-30 DIAGNOSIS — I10 Essential (primary) hypertension: Secondary | ICD-10-CM | POA: Diagnosis not present

## 2019-07-30 DIAGNOSIS — I872 Venous insufficiency (chronic) (peripheral): Secondary | ICD-10-CM | POA: Diagnosis not present

## 2019-07-30 DIAGNOSIS — G9340 Encephalopathy, unspecified: Secondary | ICD-10-CM | POA: Diagnosis not present

## 2019-07-30 DIAGNOSIS — R339 Retention of urine, unspecified: Secondary | ICD-10-CM | POA: Diagnosis not present

## 2019-07-30 DIAGNOSIS — H9190 Unspecified hearing loss, unspecified ear: Secondary | ICD-10-CM | POA: Diagnosis not present

## 2019-07-30 DIAGNOSIS — S6992XD Unspecified injury of left wrist, hand and finger(s), subsequent encounter: Secondary | ICD-10-CM | POA: Diagnosis not present

## 2019-07-30 DIAGNOSIS — I480 Paroxysmal atrial fibrillation: Secondary | ICD-10-CM | POA: Diagnosis not present

## 2019-07-30 DIAGNOSIS — I739 Peripheral vascular disease, unspecified: Secondary | ICD-10-CM | POA: Diagnosis not present

## 2019-07-30 DIAGNOSIS — S8992XD Unspecified injury of left lower leg, subsequent encounter: Secondary | ICD-10-CM | POA: Diagnosis not present

## 2019-07-30 DIAGNOSIS — M1990 Unspecified osteoarthritis, unspecified site: Secondary | ICD-10-CM | POA: Diagnosis not present

## 2019-07-30 DIAGNOSIS — F0390 Unspecified dementia without behavioral disturbance: Secondary | ICD-10-CM | POA: Diagnosis not present

## 2019-07-30 DIAGNOSIS — I350 Nonrheumatic aortic (valve) stenosis: Secondary | ICD-10-CM | POA: Diagnosis not present

## 2019-07-31 DIAGNOSIS — G9340 Encephalopathy, unspecified: Secondary | ICD-10-CM | POA: Diagnosis not present

## 2019-07-31 DIAGNOSIS — M1990 Unspecified osteoarthritis, unspecified site: Secondary | ICD-10-CM | POA: Diagnosis not present

## 2019-07-31 DIAGNOSIS — I480 Paroxysmal atrial fibrillation: Secondary | ICD-10-CM | POA: Diagnosis not present

## 2019-07-31 DIAGNOSIS — I350 Nonrheumatic aortic (valve) stenosis: Secondary | ICD-10-CM | POA: Diagnosis not present

## 2019-07-31 DIAGNOSIS — I739 Peripheral vascular disease, unspecified: Secondary | ICD-10-CM | POA: Diagnosis not present

## 2019-07-31 DIAGNOSIS — I872 Venous insufficiency (chronic) (peripheral): Secondary | ICD-10-CM | POA: Diagnosis not present

## 2019-07-31 DIAGNOSIS — S6992XD Unspecified injury of left wrist, hand and finger(s), subsequent encounter: Secondary | ICD-10-CM | POA: Diagnosis not present

## 2019-07-31 DIAGNOSIS — F0390 Unspecified dementia without behavioral disturbance: Secondary | ICD-10-CM | POA: Diagnosis not present

## 2019-07-31 DIAGNOSIS — R339 Retention of urine, unspecified: Secondary | ICD-10-CM | POA: Diagnosis not present

## 2019-08-04 DIAGNOSIS — R5381 Other malaise: Secondary | ICD-10-CM | POA: Diagnosis not present

## 2019-08-04 DIAGNOSIS — I350 Nonrheumatic aortic (valve) stenosis: Secondary | ICD-10-CM | POA: Diagnosis not present

## 2019-08-04 DIAGNOSIS — I480 Paroxysmal atrial fibrillation: Secondary | ICD-10-CM | POA: Diagnosis not present

## 2019-08-04 DIAGNOSIS — G9340 Encephalopathy, unspecified: Secondary | ICD-10-CM | POA: Diagnosis not present

## 2019-08-04 DIAGNOSIS — R6889 Other general symptoms and signs: Secondary | ICD-10-CM | POA: Diagnosis not present

## 2019-08-04 DIAGNOSIS — M25532 Pain in left wrist: Secondary | ICD-10-CM | POA: Diagnosis not present

## 2019-08-04 DIAGNOSIS — I872 Venous insufficiency (chronic) (peripheral): Secondary | ICD-10-CM | POA: Diagnosis not present

## 2019-08-04 DIAGNOSIS — S6992XD Unspecified injury of left wrist, hand and finger(s), subsequent encounter: Secondary | ICD-10-CM | POA: Diagnosis not present

## 2019-08-04 DIAGNOSIS — R931 Abnormal findings on diagnostic imaging of heart and coronary circulation: Secondary | ICD-10-CM | POA: Diagnosis not present

## 2019-08-04 DIAGNOSIS — Z87448 Personal history of other diseases of urinary system: Secondary | ICD-10-CM | POA: Diagnosis not present

## 2019-08-04 DIAGNOSIS — I739 Peripheral vascular disease, unspecified: Secondary | ICD-10-CM | POA: Diagnosis not present

## 2019-08-04 DIAGNOSIS — M1990 Unspecified osteoarthritis, unspecified site: Secondary | ICD-10-CM | POA: Diagnosis not present

## 2019-08-04 DIAGNOSIS — F0390 Unspecified dementia without behavioral disturbance: Secondary | ICD-10-CM | POA: Diagnosis not present

## 2019-08-04 DIAGNOSIS — I4891 Unspecified atrial fibrillation: Secondary | ICD-10-CM | POA: Diagnosis not present

## 2019-08-04 DIAGNOSIS — R339 Retention of urine, unspecified: Secondary | ICD-10-CM | POA: Diagnosis not present

## 2019-08-06 DIAGNOSIS — R6889 Other general symptoms and signs: Secondary | ICD-10-CM | POA: Diagnosis not present

## 2019-08-06 DIAGNOSIS — R338 Other retention of urine: Secondary | ICD-10-CM | POA: Diagnosis not present

## 2019-08-07 DIAGNOSIS — F0390 Unspecified dementia without behavioral disturbance: Secondary | ICD-10-CM | POA: Diagnosis not present

## 2019-08-07 DIAGNOSIS — G9340 Encephalopathy, unspecified: Secondary | ICD-10-CM | POA: Diagnosis not present

## 2019-08-07 DIAGNOSIS — R339 Retention of urine, unspecified: Secondary | ICD-10-CM | POA: Diagnosis not present

## 2019-08-07 DIAGNOSIS — S6992XD Unspecified injury of left wrist, hand and finger(s), subsequent encounter: Secondary | ICD-10-CM | POA: Diagnosis not present

## 2019-08-07 DIAGNOSIS — I739 Peripheral vascular disease, unspecified: Secondary | ICD-10-CM | POA: Diagnosis not present

## 2019-08-07 DIAGNOSIS — I480 Paroxysmal atrial fibrillation: Secondary | ICD-10-CM | POA: Diagnosis not present

## 2019-08-07 DIAGNOSIS — M1990 Unspecified osteoarthritis, unspecified site: Secondary | ICD-10-CM | POA: Diagnosis not present

## 2019-08-07 DIAGNOSIS — I872 Venous insufficiency (chronic) (peripheral): Secondary | ICD-10-CM | POA: Diagnosis not present

## 2019-08-07 DIAGNOSIS — I350 Nonrheumatic aortic (valve) stenosis: Secondary | ICD-10-CM | POA: Diagnosis not present

## 2019-08-10 NOTE — Progress Notes (Signed)
Cardiology Office Note:   Date:  08/11/2019  NAME:  [name] [name]    MRN: [id number] DOB:  Jul 18, 1926   PCP:  Elias Else, MD  Cardiologist:  Peter Swaziland, MD   Referring MD: Elias Else, MD   Chief Complaint  Patient presents with  . Atrial Fibrillation   History of Present Illness:   [name] [name] is a 86 y.o. male with a hx of CVA, paroxysmal Afib who is being seen today for the evaluation of atrial fibrillation at the request of Elias Else, MD. Recent admission 6/11-6/15 for Afib with RVR. Converted to NSR spontaneously. Concerns for RA mass on echo which is likely lipomatous hypertrophy.   He presents with his daughter.  Apparently on the day of admission on 07/24/2019 it was a warm hot day.  They visited to doctors offices and were heading home.  Once he got home he apparently became unresponsive in his chair.  He is wheelchair-bound due to a left AKA.  He apparently was not easily aroused.  He was taken to the hospital where he was found to be encephalopathic and in A. fib with RVR.  There was concerns for dehydration that triggered his A. fib.  He apparently spontaneously cardioverted back to normal sinus rhythm during that admission.  He was discharged home only on metoprolol.  His daughter reports that he has been sluggish and fatigued since going home.  His blood pressure is 99/61.  He is not eating or drinking well.  There are possibly some cognitive impairment issues here.  They are working to try to increase hydration as well as food intake.  He is without any symptoms of chest pain or shortness of breath today.  His EKG today demonstrates normal sinus rhythm with heart rate 85.  He apparently was on amiodarone in the past but this has been stopped for unclear reasons.  I think amiodarone will be a great medication to keep him in normal rhythm and prevent any further recurrence of his atrial fibrillation.  He is dependent on the use of a wheelchair.  I did discuss with his daughter  that he needs help with bathing and dressing.  He apparently has a home health aide.  He is able to feed himself but does require assistance with all other activities of daily living.  EKG today demonstrates normal sinus rhythm no further recurrence of atrial fibrillation.  He denies any chest pain, shortness of breath or palpitations today.  Appears to be stable since discharge.  Problem List 1. 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 Paroxysmal Afib 3. L AKA  Past Medical History: Past Medical History:  Diagnosis Date  . A-fib (HCC)   . Cerebral aneurysm    history of cerebral hemorrhage due to brain aneurysm rupture 25 yrs ago  . Complication of anesthesia    had Atrial Fib.- per patient  . DJD (degenerative joint disease)   . DVT (deep venous thrombosis) (HCC)    history of prior to amputation  . Dysrhythmia    PAF  . History of kidney stones    passed all except 1.  . HOH (hard of hearing)   . Peripheral arterial disease (HCC)   . Stroke [facility])    patient unaware  . Urethral stricture    history of dilated    Past Surgical History: Past Surgical History:  Procedure Laterality Date  . ABOVE KNEE LEG AMPUTATION     left  .  APPENDECTOMY    . CYST EXCISION     above rectum  . CYSTOSCOPY     kidney stone  . HERNIA REPAIR Left   . LUMBAR WOUND DEBRIDEMENT N/A 06/22/2013   Procedure: incision and drainage of posterior cervical wound;  Surgeon: Cristi Loron, MD;  Location: [facility];  Service: Neurosurgery;  Laterality: N/A;  . PROSTATECTOMY    . SUBOCCIPITAL CRANIECTOMY CERVICAL LAMINECTOMY Left 05/13/2013   Procedure: Left SUBOCCIPITAL CRANIECTOMY;  Surgeon: Cristi Loron, MD;  Location: [facility];  Service: Neurosurgery;  Laterality: Left;  . TONSILLECTOMY AND ADENOIDECTOMY    . VARICOSE VEIN SURGERY      Current Medications: Current Meds  Medication Sig  . aspirin 81 MG chewable tablet Chew 1  tablet (81 mg total) by mouth daily.  . Diaper Rash Products (DESITIN) OINT Apply 1 application topically daily as needed (Breakouts on ankle).   . Multiple Vitamin (MULTIVITAMIN WITH MINERALS) TABS tablet Take 1 tablet by mouth every evening. [employer] Senior's  . pantoprazole (PROTONIX) 40 MG tablet Take 1 tablet (40 mg total) by mouth daily.  . VOLTAREN 1 % GEL Apply 1 application topically in the morning and at bedtime. Apply sparingly.  . [DISCONTINUED] metoprolol tartrate (LOPRESSOR) 25 MG tablet Take 1 tablet (25 mg total) by mouth 2 (two) times daily.     Allergies:    Coumadin [warfarin sodium]   Social History: Social History   Socioeconomic History  . Marital status: Widowed    Spouse name: Not on file  . Number of children: 2  . Years of education: Not on file  . Highest education level: Not on file  Occupational History  . Occupation: retired    Associate Professor: RETIRED  Tobacco Use  . Smoking status: Never Smoker  . Smokeless tobacco: Never Used  Vaping Use  . Vaping Use: Never used  Substance and Sexual Activity  . Alcohol use: No  . Drug use: No  . Sexual activity: Not Currently  Other Topics Concern  . Not on file  Social History Narrative   Former occasional smoker   Drinks occasionally   Originally from [place]   Used to work as Therapist, sports, Chief of Staff and in Designer, fashion/clothing   Social Determinants of Corporate investment banker Strain:   . Difficulty of Paying Living Expenses:   Food Insecurity:   . Worried About Programme researcher, broadcasting/film/video in the Last Year:   . Barista in the Last Year:   Transportation Needs:   . Freight forwarder (Medical):   Marland Kitchen Lack of Transportation (Non-Medical):   Physical Activity:   . Days of Exercise per Week:   . Minutes of Exercise per Session:   Stress:   . Feeling of Stress :   Social Connections:   . Frequency of Communication with Friends and Family:   . Frequency of Social Gatherings with Friends and Family:   .  Attends Religious Services:   . Active Member of Clubs or Organizations:   . Attends Banker Meetings:   Marland Kitchen Marital Status:      Family History: The patient's family history includes Breast cancer in his daughter and mother; Heart attack in his father; Hypertension in an other family member.  ROS:   All other ROS reviewed and negative. Pertinent positives noted in the HPI.     EKGs/Labs/Other Studies Reviewed:   The following studies were personally reviewed by me today:  EKG:  EKG  is ordered today.  The ekg ordered today demonstrates normal sinus rhythm, heart rate 85, no acute ST-T changes, old anterior infarct, old inferior infarct, and was personally reviewed by me.  TTE 07/27/2019 1. Left ventricular ejection fraction, by estimation, is 60 to 65%. The  left ventricle has normal function. The left ventricle has no regional  wall motion abnormalities. There is mild left ventricular hypertrophy of  the basal-septal segment. Left  ventricular diastolic parameters are consistent with Grade I diastolic  dysfunction (impaired relaxation).  2. Right ventricular systolic function is normal. The right ventricular  size is normal. Tricuspid regurgitation signal is inadequate for assessing  PA pressure.  3. The mitral valve is normal in structure. No evidence of mitral valve  regurgitation. No evidence of mitral stenosis.  4. The aortic valve is tricuspid. Aortic valve regurgitation is trivial.  Mild to moderate aortic valve sclerosis/calcification is present, without  any evidence of aortic stenosis.  5. The inferior vena cava is normal in size with greater than 50%  respiratory variability, suggesting right atrial pressure of 3 mmHg.  6. There is a symmetrical density that appears to originate from the  interatrial septum and extends into the right atrial cavity. Unclear if  this represents lipomatous hypertrophy or possible myxoma.  7. Consider TEE if clinically  indicated.    Recent Labs: 07/24/2019: TSH 3.835 07/27/2019: ALT 19; B Natriuretic Peptide 141.9; BUN 22; Creatinine, Ser 1.09; Hemoglobin 11.8; Magnesium 1.9; Platelets 134; Potassium 3.8; Sodium 141   Recent Lipid Panel    Component Value Date/Time   CHOL 167 05/31/2014 0629   TRIG 152 (H) 05/31/2014 0629   HDL 30 (L) 05/31/2014 0629   CHOLHDL 5.6 05/31/2014 0629   VLDL 30 05/31/2014 0629   LDLCALC 107 (H) 05/31/2014 0629    Physical Exam:   VS:  BP 99/61   Pulse 85   Ht 5\' 11"  (1.803 m)   Wt 175 lb (79.4 kg)   SpO2 99%   BMI 24.41 kg/m    Wt Readings from Last 3 Encounters:  08/11/19 175 lb (79.4 kg)  07/28/19 171 lb 4.8 oz (77.7 kg)  05/30/14 265 lb (120.2 kg)    General: Well nourished, well developed, in no acute distress Heart: Atraumatic, normal size  Eyes: PEERLA, EOMI  Neck: Supple, no JVD Endocrine: No thryomegaly Cardiac: Normal S1, S2; RRR; no murmurs, rubs, or gallops Lungs: Clear to auscultation bilaterally, no wheezing, rhonchi or rales  Abd: Soft, nontender, no hepatomegaly  Ext: Status post left AKA Musculoskeletal: No deformities, BUE and BLE strength normal and equal Skin: Warm and dry, no rashes   Neuro: Alert and oriented to person, place, time, and situation, CNII-XII grossly intact, no focal deficits  Psych: Normal mood and affect   ASSESSMENT:   [name] [name] is a 86 y.o. male who presents for the following: 1. Paroxysmal atrial fibrillation (HCC)   2. Right atrial mass     PLAN:   1. Paroxysmal atrial fibrillation (HCC) -Not a candidate for anticoagulation due to prior intracerebral hemorrhage.  Back in normal sinus rhythm.  He was on amiodarone in the past.  We will go ahead and restart him on 100 mg of amiodarone daily.  Most recent chest x-ray clear.  He has had recent thyroid studies.  We will plan to see him back in 6 months.  Hopefully amiodarone will prevent any recurrence of atrial fibrillation.  I suspect the recurrence was related to  dehydration.  2. Right atrial  mass -He did have concerns for lipomatous interatrial septum on echocardiogram.  There was mention that this could be more than that.  He is quite debilitated and elderly.  There is possibly concerns for cognitive impairment here.  I have no concerns for infection at this time.  I have no concerns for an obstructive mass.  He would not be a candidate for cardiac surgery and I see no further need to evaluate this.  I did discuss this with the daughter and she is in agreement with this plan.  We will continue to just monitor him clinically.  Disposition: Return in about 6 months (around 02/10/2020).  Medication Adjustments/Labs and Tests Ordered: Current medicines are reviewed at length with the patient today.  Concerns regarding medicines are outlined above.  Orders Placed This Encounter  Procedures  . EKG 12-Lead   Meds ordered this encounter  Medications  . amiodarone (PACERONE) 100 MG tablet    Sig: Take 1 tablet (100 mg total) by mouth daily.    Dispense:  90 tablet    Refill:  1    Patient Instructions  Medication Instructions:  Stop Metoprolol Start Amiodarone 100 mg daily   *If you need a refill on your cardiac medications before your next appointment, please call your pharmacy*    Follow-Up: At [facility], you and your health needs are our priority.  As part of our continuing mission to provide you with exceptional heart care, we have created designated Provider Care Teams.  These Care Teams include your primary Cardiologist (physician) and Advanced Practice Providers (APPs -  Physician Assistants and Nurse Practitioners) who all work together to provide you with the care you need, when you need it.  We recommend signing up for the patient portal called "MyChart".  Sign up information is provided on this After Visit Summary.  MyChart is used to connect with patients for Virtual Visits (Telemedicine).  Patients are able to view lab/test  results, encounter notes, upcoming appointments, etc.  Non-urgent messages can be sent to your provider as well.   To learn more about what you can do with MyChart, go to [email or web address].    Your next appointment:   6 month(s)  The format for your next appointment:   In Person  Provider:   Lennie Odor, MD       Signed, Lenna Gilford. Flora Lipps, MD [facility]  [address], [address] [place], [place] [address] [phone]  08/11/2019 4:41 PM

## 2019-08-11 ENCOUNTER — Other Ambulatory Visit: Payer: Self-pay

## 2019-08-11 ENCOUNTER — Ambulatory Visit: Payer: Medicare HMO | Admitting: Cardiovascular Disease

## 2019-08-11 ENCOUNTER — Encounter: Payer: Self-pay | Admitting: Cardiovascular Disease

## 2019-08-11 VITALS — BP 99/61 | HR 85 | Ht 71.0 in | Wt 175.0 lb

## 2019-08-11 DIAGNOSIS — I739 Peripheral vascular disease, unspecified: Secondary | ICD-10-CM | POA: Diagnosis not present

## 2019-08-11 DIAGNOSIS — F0390 Unspecified dementia without behavioral disturbance: Secondary | ICD-10-CM | POA: Diagnosis not present

## 2019-08-11 DIAGNOSIS — M1990 Unspecified osteoarthritis, unspecified site: Secondary | ICD-10-CM | POA: Diagnosis not present

## 2019-08-11 DIAGNOSIS — I5189 Other ill-defined heart diseases: Secondary | ICD-10-CM | POA: Diagnosis not present

## 2019-08-11 DIAGNOSIS — I350 Nonrheumatic aortic (valve) stenosis: Secondary | ICD-10-CM | POA: Diagnosis not present

## 2019-08-11 DIAGNOSIS — I872 Venous insufficiency (chronic) (peripheral): Secondary | ICD-10-CM | POA: Diagnosis not present

## 2019-08-11 DIAGNOSIS — I480 Paroxysmal atrial fibrillation: Secondary | ICD-10-CM

## 2019-08-11 DIAGNOSIS — R339 Retention of urine, unspecified: Secondary | ICD-10-CM | POA: Diagnosis not present

## 2019-08-11 DIAGNOSIS — R6889 Other general symptoms and signs: Secondary | ICD-10-CM | POA: Diagnosis not present

## 2019-08-11 DIAGNOSIS — S6992XD Unspecified injury of left wrist, hand and finger(s), subsequent encounter: Secondary | ICD-10-CM | POA: Diagnosis not present

## 2019-08-11 DIAGNOSIS — G9340 Encephalopathy, unspecified: Secondary | ICD-10-CM | POA: Diagnosis not present

## 2019-08-11 MED ORDER — AMIODARONE HCL 100 MG PO TABS
100.0000 mg | ORAL_TABLET | Freq: Every day | ORAL | 1 refills | Status: DC
Start: 2019-08-11 — End: 2019-10-05

## 2019-08-11 NOTE — Patient Instructions (Signed)
Medication Instructions:  Stop Metoprolol Start Amiodarone 100 mg daily   *If you need a refill on your cardiac medications before your next appointment, please call your pharmacy*    Follow-Up: At [facility], you and your health needs are our priority.  As part of our continuing mission to provide you with exceptional heart care, we have created designated Provider Care Teams.  These Care Teams include your primary Cardiologist (physician) and Advanced Practice Providers (APPs -  Physician Assistants and Nurse Practitioners) who all work together to provide you with the care you need, when you need it.  We recommend signing up for the patient portal called "MyChart".  Sign up information is provided on this After Visit Summary.  MyChart is used to connect with patients for Virtual Visits (Telemedicine).  Patients are able to view lab/test results, encounter notes, upcoming appointments, etc.  Non-urgent messages can be sent to your provider as well.   To learn more about what you can do with MyChart, go to [email or web address].    Your next appointment:   6 month(s)  The format for your next appointment:   In Person  Provider:   Lennie Odor, MD

## 2019-08-14 DIAGNOSIS — I739 Peripheral vascular disease, unspecified: Secondary | ICD-10-CM | POA: Diagnosis not present

## 2019-08-14 DIAGNOSIS — I480 Paroxysmal atrial fibrillation: Secondary | ICD-10-CM | POA: Diagnosis not present

## 2019-08-14 DIAGNOSIS — L89622 Pressure ulcer of left heel, stage 2: Secondary | ICD-10-CM | POA: Diagnosis not present

## 2019-08-14 DIAGNOSIS — F0390 Unspecified dementia without behavioral disturbance: Secondary | ICD-10-CM | POA: Diagnosis not present

## 2019-08-14 DIAGNOSIS — R339 Retention of urine, unspecified: Secondary | ICD-10-CM | POA: Diagnosis not present

## 2019-08-14 DIAGNOSIS — I872 Venous insufficiency (chronic) (peripheral): Secondary | ICD-10-CM | POA: Diagnosis not present

## 2019-08-14 DIAGNOSIS — L089 Local infection of the skin and subcutaneous tissue, unspecified: Secondary | ICD-10-CM | POA: Diagnosis not present

## 2019-08-14 DIAGNOSIS — G9340 Encephalopathy, unspecified: Secondary | ICD-10-CM | POA: Diagnosis not present

## 2019-08-14 DIAGNOSIS — I350 Nonrheumatic aortic (valve) stenosis: Secondary | ICD-10-CM | POA: Diagnosis not present

## 2019-08-14 DIAGNOSIS — S6992XD Unspecified injury of left wrist, hand and finger(s), subsequent encounter: Secondary | ICD-10-CM | POA: Diagnosis not present

## 2019-08-14 DIAGNOSIS — M1990 Unspecified osteoarthritis, unspecified site: Secondary | ICD-10-CM | POA: Diagnosis not present

## 2019-08-17 DIAGNOSIS — F0390 Unspecified dementia without behavioral disturbance: Secondary | ICD-10-CM | POA: Diagnosis not present

## 2019-08-17 DIAGNOSIS — I480 Paroxysmal atrial fibrillation: Secondary | ICD-10-CM | POA: Diagnosis not present

## 2019-08-17 DIAGNOSIS — G9340 Encephalopathy, unspecified: Secondary | ICD-10-CM | POA: Diagnosis not present

## 2019-08-17 DIAGNOSIS — R339 Retention of urine, unspecified: Secondary | ICD-10-CM | POA: Diagnosis not present

## 2019-08-17 DIAGNOSIS — I872 Venous insufficiency (chronic) (peripheral): Secondary | ICD-10-CM | POA: Diagnosis not present

## 2019-08-17 DIAGNOSIS — M1990 Unspecified osteoarthritis, unspecified site: Secondary | ICD-10-CM | POA: Diagnosis not present

## 2019-08-17 DIAGNOSIS — I350 Nonrheumatic aortic (valve) stenosis: Secondary | ICD-10-CM | POA: Diagnosis not present

## 2019-08-17 DIAGNOSIS — I739 Peripheral vascular disease, unspecified: Secondary | ICD-10-CM | POA: Diagnosis not present

## 2019-08-17 DIAGNOSIS — S6992XD Unspecified injury of left wrist, hand and finger(s), subsequent encounter: Secondary | ICD-10-CM | POA: Diagnosis not present

## 2019-08-20 DIAGNOSIS — R6889 Other general symptoms and signs: Secondary | ICD-10-CM | POA: Diagnosis not present

## 2019-08-20 DIAGNOSIS — M19032 Primary osteoarthritis, left wrist: Secondary | ICD-10-CM | POA: Diagnosis not present

## 2019-08-21 ENCOUNTER — Other Ambulatory Visit: Payer: Self-pay

## 2019-08-21 ENCOUNTER — Encounter: Payer: Self-pay | Admitting: Podiatry

## 2019-08-21 ENCOUNTER — Ambulatory Visit: Payer: Medicare HMO | Admitting: Podiatry

## 2019-08-21 DIAGNOSIS — B351 Tinea unguium: Secondary | ICD-10-CM

## 2019-08-21 DIAGNOSIS — M79674 Pain in right toe(s): Secondary | ICD-10-CM

## 2019-08-21 DIAGNOSIS — R6889 Other general symptoms and signs: Secondary | ICD-10-CM | POA: Diagnosis not present

## 2019-08-21 DIAGNOSIS — D689 Coagulation defect, unspecified: Secondary | ICD-10-CM

## 2019-08-21 DIAGNOSIS — I739 Peripheral vascular disease, unspecified: Secondary | ICD-10-CM | POA: Diagnosis not present

## 2019-08-21 DIAGNOSIS — M79675 Pain in left toe(s): Secondary | ICD-10-CM

## 2019-08-21 NOTE — Progress Notes (Signed)
This patient returns to my office for at risk foot care.  This patient requires this care by a professional since this patient will be at risk due to having PVD and coagulation defect.  Patient is taking coumadin. This patient has history of amputation left leg. Patient is in a wheelchair accompanied by his daughter.    This patient is unable to cut nails himself since the patient cannot reach his nails.These nails are painful walking and wearing shoes.  This patient presents for at risk foot care today.  General Appearance  Alert, conversant and in no acute stress.  Vascular  Dorsalis pedis and posterior tibial  pulses are not  palpable  right foot..  Capillary return is within normal limits  bilaterally. Temperature is within normal limits  bilaterally.  Neurologic  Deferred   Nails Thick disfigured discolored nails with subungual debris  from hallux to fifth toes right. No evidence of bacterial infection or drainage bilaterally.  Orthopedic  No limitations of motion  feet .  No crepitus or effusions noted.  No bony pathology or digital deformities noted. Amputation left leg.    Skin  normotropic skin with no porokeratosis noted bilaterally. Healing ulcer dorsum right hallux.  No infection.  Crusty healing skin lesion medial aspect right foot.  No infection noted.   Onychomycosis  Pain in right toes    Consent was obtained for treatment procedures.   Mechanical debridement of nails 1-5  right performed with a nail nipper.  Filed with dremel without incident.    Return office visit    3 months                  Told patient to return for periodic foot care and evaluation due to potential at risk complications.   Helane Gunther DPM

## 2019-08-26 DIAGNOSIS — I4891 Unspecified atrial fibrillation: Secondary | ICD-10-CM | POA: Diagnosis not present

## 2019-08-26 DIAGNOSIS — R339 Retention of urine, unspecified: Secondary | ICD-10-CM | POA: Diagnosis not present

## 2019-08-26 DIAGNOSIS — S6992XD Unspecified injury of left wrist, hand and finger(s), subsequent encounter: Secondary | ICD-10-CM | POA: Diagnosis not present

## 2019-08-26 DIAGNOSIS — M1990 Unspecified osteoarthritis, unspecified site: Secondary | ICD-10-CM | POA: Diagnosis not present

## 2019-08-26 DIAGNOSIS — Z8679 Personal history of other diseases of the circulatory system: Secondary | ICD-10-CM | POA: Diagnosis not present

## 2019-08-26 DIAGNOSIS — I480 Paroxysmal atrial fibrillation: Secondary | ICD-10-CM | POA: Diagnosis not present

## 2019-08-26 DIAGNOSIS — I739 Peripheral vascular disease, unspecified: Secondary | ICD-10-CM | POA: Diagnosis not present

## 2019-08-26 DIAGNOSIS — G9340 Encephalopathy, unspecified: Secondary | ICD-10-CM | POA: Diagnosis not present

## 2019-08-26 DIAGNOSIS — I350 Nonrheumatic aortic (valve) stenosis: Secondary | ICD-10-CM | POA: Diagnosis not present

## 2019-08-26 DIAGNOSIS — F0390 Unspecified dementia without behavioral disturbance: Secondary | ICD-10-CM | POA: Diagnosis not present

## 2019-08-26 DIAGNOSIS — I872 Venous insufficiency (chronic) (peripheral): Secondary | ICD-10-CM | POA: Diagnosis not present

## 2019-08-29 DIAGNOSIS — I480 Paroxysmal atrial fibrillation: Secondary | ICD-10-CM | POA: Diagnosis not present

## 2019-08-29 DIAGNOSIS — I872 Venous insufficiency (chronic) (peripheral): Secondary | ICD-10-CM | POA: Diagnosis not present

## 2019-08-29 DIAGNOSIS — R339 Retention of urine, unspecified: Secondary | ICD-10-CM | POA: Diagnosis not present

## 2019-08-29 DIAGNOSIS — F0390 Unspecified dementia without behavioral disturbance: Secondary | ICD-10-CM | POA: Diagnosis not present

## 2019-08-29 DIAGNOSIS — I739 Peripheral vascular disease, unspecified: Secondary | ICD-10-CM | POA: Diagnosis not present

## 2019-08-29 DIAGNOSIS — S6992XD Unspecified injury of left wrist, hand and finger(s), subsequent encounter: Secondary | ICD-10-CM | POA: Diagnosis not present

## 2019-08-29 DIAGNOSIS — M1990 Unspecified osteoarthritis, unspecified site: Secondary | ICD-10-CM | POA: Diagnosis not present

## 2019-08-29 DIAGNOSIS — I350 Nonrheumatic aortic (valve) stenosis: Secondary | ICD-10-CM | POA: Diagnosis not present

## 2019-08-29 DIAGNOSIS — G9340 Encephalopathy, unspecified: Secondary | ICD-10-CM | POA: Diagnosis not present

## 2019-09-03 DIAGNOSIS — R6889 Other general symptoms and signs: Secondary | ICD-10-CM | POA: Diagnosis not present

## 2019-09-03 DIAGNOSIS — G9340 Encephalopathy, unspecified: Secondary | ICD-10-CM | POA: Diagnosis not present

## 2019-09-03 DIAGNOSIS — R339 Retention of urine, unspecified: Secondary | ICD-10-CM | POA: Diagnosis not present

## 2019-09-03 DIAGNOSIS — S6992XD Unspecified injury of left wrist, hand and finger(s), subsequent encounter: Secondary | ICD-10-CM | POA: Diagnosis not present

## 2019-09-03 DIAGNOSIS — D696 Thrombocytopenia, unspecified: Secondary | ICD-10-CM | POA: Diagnosis not present

## 2019-09-03 DIAGNOSIS — M1990 Unspecified osteoarthritis, unspecified site: Secondary | ICD-10-CM | POA: Diagnosis not present

## 2019-09-03 DIAGNOSIS — I10 Essential (primary) hypertension: Secondary | ICD-10-CM | POA: Diagnosis not present

## 2019-09-03 DIAGNOSIS — E7800 Pure hypercholesterolemia, unspecified: Secondary | ICD-10-CM | POA: Diagnosis not present

## 2019-09-03 DIAGNOSIS — I480 Paroxysmal atrial fibrillation: Secondary | ICD-10-CM | POA: Diagnosis not present

## 2019-09-03 DIAGNOSIS — I739 Peripheral vascular disease, unspecified: Secondary | ICD-10-CM | POA: Diagnosis not present

## 2019-09-03 DIAGNOSIS — Z89612 Acquired absence of left leg above knee: Secondary | ICD-10-CM | POA: Diagnosis not present

## 2019-09-03 DIAGNOSIS — D692 Other nonthrombocytopenic purpura: Secondary | ICD-10-CM | POA: Diagnosis not present

## 2019-09-03 DIAGNOSIS — F0390 Unspecified dementia without behavioral disturbance: Secondary | ICD-10-CM | POA: Diagnosis not present

## 2019-09-03 DIAGNOSIS — Z993 Dependence on wheelchair: Secondary | ICD-10-CM | POA: Diagnosis not present

## 2019-09-03 DIAGNOSIS — R946 Abnormal results of thyroid function studies: Secondary | ICD-10-CM | POA: Diagnosis not present

## 2019-09-03 DIAGNOSIS — I872 Venous insufficiency (chronic) (peripheral): Secondary | ICD-10-CM | POA: Diagnosis not present

## 2019-09-03 DIAGNOSIS — I350 Nonrheumatic aortic (valve) stenosis: Secondary | ICD-10-CM | POA: Diagnosis not present

## 2019-09-08 DIAGNOSIS — S6992XD Unspecified injury of left wrist, hand and finger(s), subsequent encounter: Secondary | ICD-10-CM | POA: Diagnosis not present

## 2019-09-08 DIAGNOSIS — I350 Nonrheumatic aortic (valve) stenosis: Secondary | ICD-10-CM | POA: Diagnosis not present

## 2019-09-08 DIAGNOSIS — M1990 Unspecified osteoarthritis, unspecified site: Secondary | ICD-10-CM | POA: Diagnosis not present

## 2019-09-08 DIAGNOSIS — I739 Peripheral vascular disease, unspecified: Secondary | ICD-10-CM | POA: Diagnosis not present

## 2019-09-08 DIAGNOSIS — I872 Venous insufficiency (chronic) (peripheral): Secondary | ICD-10-CM | POA: Diagnosis not present

## 2019-09-08 DIAGNOSIS — F0390 Unspecified dementia without behavioral disturbance: Secondary | ICD-10-CM | POA: Diagnosis not present

## 2019-09-08 DIAGNOSIS — G9340 Encephalopathy, unspecified: Secondary | ICD-10-CM | POA: Diagnosis not present

## 2019-09-08 DIAGNOSIS — R339 Retention of urine, unspecified: Secondary | ICD-10-CM | POA: Diagnosis not present

## 2019-09-08 DIAGNOSIS — I480 Paroxysmal atrial fibrillation: Secondary | ICD-10-CM | POA: Diagnosis not present

## 2019-09-10 DIAGNOSIS — I480 Paroxysmal atrial fibrillation: Secondary | ICD-10-CM | POA: Diagnosis not present

## 2019-09-10 DIAGNOSIS — F0390 Unspecified dementia without behavioral disturbance: Secondary | ICD-10-CM | POA: Diagnosis not present

## 2019-09-10 DIAGNOSIS — I872 Venous insufficiency (chronic) (peripheral): Secondary | ICD-10-CM | POA: Diagnosis not present

## 2019-09-10 DIAGNOSIS — I350 Nonrheumatic aortic (valve) stenosis: Secondary | ICD-10-CM | POA: Diagnosis not present

## 2019-09-10 DIAGNOSIS — G9340 Encephalopathy, unspecified: Secondary | ICD-10-CM | POA: Diagnosis not present

## 2019-09-10 DIAGNOSIS — R339 Retention of urine, unspecified: Secondary | ICD-10-CM | POA: Diagnosis not present

## 2019-09-10 DIAGNOSIS — S6992XD Unspecified injury of left wrist, hand and finger(s), subsequent encounter: Secondary | ICD-10-CM | POA: Diagnosis not present

## 2019-09-10 DIAGNOSIS — I739 Peripheral vascular disease, unspecified: Secondary | ICD-10-CM | POA: Diagnosis not present

## 2019-09-10 DIAGNOSIS — M1990 Unspecified osteoarthritis, unspecified site: Secondary | ICD-10-CM | POA: Diagnosis not present

## 2019-09-17 DIAGNOSIS — I480 Paroxysmal atrial fibrillation: Secondary | ICD-10-CM | POA: Diagnosis not present

## 2019-09-17 DIAGNOSIS — G9340 Encephalopathy, unspecified: Secondary | ICD-10-CM | POA: Diagnosis not present

## 2019-09-17 DIAGNOSIS — S6992XD Unspecified injury of left wrist, hand and finger(s), subsequent encounter: Secondary | ICD-10-CM | POA: Diagnosis not present

## 2019-09-17 DIAGNOSIS — R339 Retention of urine, unspecified: Secondary | ICD-10-CM | POA: Diagnosis not present

## 2019-09-17 DIAGNOSIS — I872 Venous insufficiency (chronic) (peripheral): Secondary | ICD-10-CM | POA: Diagnosis not present

## 2019-09-17 DIAGNOSIS — I739 Peripheral vascular disease, unspecified: Secondary | ICD-10-CM | POA: Diagnosis not present

## 2019-09-17 DIAGNOSIS — I350 Nonrheumatic aortic (valve) stenosis: Secondary | ICD-10-CM | POA: Diagnosis not present

## 2019-09-17 DIAGNOSIS — F0390 Unspecified dementia without behavioral disturbance: Secondary | ICD-10-CM | POA: Diagnosis not present

## 2019-09-17 DIAGNOSIS — M1990 Unspecified osteoarthritis, unspecified site: Secondary | ICD-10-CM | POA: Diagnosis not present

## 2019-09-22 DIAGNOSIS — S6992XD Unspecified injury of left wrist, hand and finger(s), subsequent encounter: Secondary | ICD-10-CM | POA: Diagnosis not present

## 2019-09-22 DIAGNOSIS — F0390 Unspecified dementia without behavioral disturbance: Secondary | ICD-10-CM | POA: Diagnosis not present

## 2019-09-22 DIAGNOSIS — I480 Paroxysmal atrial fibrillation: Secondary | ICD-10-CM | POA: Diagnosis not present

## 2019-09-22 DIAGNOSIS — G9340 Encephalopathy, unspecified: Secondary | ICD-10-CM | POA: Diagnosis not present

## 2019-09-22 DIAGNOSIS — I872 Venous insufficiency (chronic) (peripheral): Secondary | ICD-10-CM | POA: Diagnosis not present

## 2019-09-22 DIAGNOSIS — I350 Nonrheumatic aortic (valve) stenosis: Secondary | ICD-10-CM | POA: Diagnosis not present

## 2019-09-22 DIAGNOSIS — I739 Peripheral vascular disease, unspecified: Secondary | ICD-10-CM | POA: Diagnosis not present

## 2019-09-22 DIAGNOSIS — R339 Retention of urine, unspecified: Secondary | ICD-10-CM | POA: Diagnosis not present

## 2019-09-22 DIAGNOSIS — M1990 Unspecified osteoarthritis, unspecified site: Secondary | ICD-10-CM | POA: Diagnosis not present

## 2019-09-26 DIAGNOSIS — I4891 Unspecified atrial fibrillation: Secondary | ICD-10-CM | POA: Diagnosis not present

## 2019-09-26 DIAGNOSIS — Z8679 Personal history of other diseases of the circulatory system: Secondary | ICD-10-CM | POA: Diagnosis not present

## 2019-09-29 ENCOUNTER — Other Ambulatory Visit: Payer: Self-pay

## 2019-09-29 ENCOUNTER — Inpatient Hospital Stay (HOSPITAL_COMMUNITY)
Admission: EM | Admit: 2019-09-29 | Discharge: 2019-10-05 | DRG: 871 | Disposition: A | Discharge status: Hospice | Payer: Medicare HMO | Attending: Internal Medicine | Admitting: Internal Medicine

## 2019-09-29 ENCOUNTER — Emergency Department (HOSPITAL_COMMUNITY): Payer: Medicare HMO

## 2019-09-29 ENCOUNTER — Encounter (HOSPITAL_COMMUNITY): Payer: Self-pay | Admitting: Emergency Medicine

## 2019-09-29 DIAGNOSIS — Z86711 Personal history of pulmonary embolism: Secondary | ICD-10-CM

## 2019-09-29 DIAGNOSIS — R6521 Severe sepsis with septic shock: Secondary | ICD-10-CM | POA: Diagnosis not present

## 2019-09-29 DIAGNOSIS — R7401 Elevation of levels of liver transaminase levels: Secondary | ICD-10-CM | POA: Diagnosis present

## 2019-09-29 DIAGNOSIS — I739 Peripheral vascular disease, unspecified: Secondary | ICD-10-CM | POA: Diagnosis present

## 2019-09-29 DIAGNOSIS — Z7189 Other specified counseling: Secondary | ICD-10-CM

## 2019-09-29 DIAGNOSIS — R410 Disorientation, unspecified: Secondary | ICD-10-CM | POA: Diagnosis not present

## 2019-09-29 DIAGNOSIS — L89322 Pressure ulcer of left buttock, stage 2: Secondary | ICD-10-CM | POA: Diagnosis not present

## 2019-09-29 DIAGNOSIS — R531 Weakness: Secondary | ICD-10-CM

## 2019-09-29 DIAGNOSIS — R0902 Hypoxemia: Secondary | ICD-10-CM | POA: Diagnosis not present

## 2019-09-29 DIAGNOSIS — R4020 Unspecified coma: Secondary | ICD-10-CM | POA: Diagnosis not present

## 2019-09-29 DIAGNOSIS — Z803 Family history of malignant neoplasm of breast: Secondary | ICD-10-CM | POA: Diagnosis not present

## 2019-09-29 DIAGNOSIS — E860 Dehydration: Secondary | ICD-10-CM | POA: Diagnosis present

## 2019-09-29 DIAGNOSIS — J9811 Atelectasis: Secondary | ICD-10-CM | POA: Diagnosis not present

## 2019-09-29 DIAGNOSIS — R1310 Dysphagia, unspecified: Secondary | ICD-10-CM | POA: Diagnosis present

## 2019-09-29 DIAGNOSIS — G9341 Metabolic encephalopathy: Secondary | ICD-10-CM | POA: Diagnosis not present

## 2019-09-29 DIAGNOSIS — Z789 Other specified health status: Secondary | ICD-10-CM

## 2019-09-29 DIAGNOSIS — Z86718 Personal history of other venous thrombosis and embolism: Secondary | ICD-10-CM | POA: Diagnosis not present

## 2019-09-29 DIAGNOSIS — I480 Paroxysmal atrial fibrillation: Secondary | ICD-10-CM | POA: Diagnosis present

## 2019-09-29 DIAGNOSIS — R627 Adult failure to thrive: Secondary | ICD-10-CM | POA: Diagnosis present

## 2019-09-29 DIAGNOSIS — Z9889 Other specified postprocedural states: Secondary | ICD-10-CM | POA: Diagnosis not present

## 2019-09-29 DIAGNOSIS — I6782 Cerebral ischemia: Secondary | ICD-10-CM | POA: Diagnosis not present

## 2019-09-29 DIAGNOSIS — Z515 Encounter for palliative care: Secondary | ICD-10-CM | POA: Diagnosis not present

## 2019-09-29 DIAGNOSIS — L8990 Pressure ulcer of unspecified site, unspecified stage: Secondary | ICD-10-CM | POA: Insufficient documentation

## 2019-09-29 DIAGNOSIS — I4891 Unspecified atrial fibrillation: Secondary | ICD-10-CM

## 2019-09-29 DIAGNOSIS — N182 Chronic kidney disease, stage 2 (mild): Secondary | ICD-10-CM | POA: Diagnosis present

## 2019-09-29 DIAGNOSIS — A4189 Other specified sepsis: Secondary | ICD-10-CM | POA: Diagnosis not present

## 2019-09-29 DIAGNOSIS — U071 COVID-19: Secondary | ICD-10-CM | POA: Diagnosis not present

## 2019-09-29 DIAGNOSIS — R404 Transient alteration of awareness: Secondary | ICD-10-CM | POA: Diagnosis not present

## 2019-09-29 DIAGNOSIS — D696 Thrombocytopenia, unspecified: Secondary | ICD-10-CM | POA: Diagnosis present

## 2019-09-29 DIAGNOSIS — E872 Acidosis: Secondary | ICD-10-CM | POA: Diagnosis present

## 2019-09-29 DIAGNOSIS — A419 Sepsis, unspecified organism: Secondary | ICD-10-CM

## 2019-09-29 DIAGNOSIS — Z8249 Family history of ischemic heart disease and other diseases of the circulatory system: Secondary | ICD-10-CM

## 2019-09-29 DIAGNOSIS — G9389 Other specified disorders of brain: Secondary | ICD-10-CM | POA: Diagnosis not present

## 2019-09-29 DIAGNOSIS — L89312 Pressure ulcer of right buttock, stage 2: Secondary | ICD-10-CM | POA: Diagnosis not present

## 2019-09-29 DIAGNOSIS — Z66 Do not resuscitate: Secondary | ICD-10-CM

## 2019-09-29 DIAGNOSIS — Z8673 Personal history of transient ischemic attack (TIA), and cerebral infarction without residual deficits: Secondary | ICD-10-CM | POA: Diagnosis not present

## 2019-09-29 DIAGNOSIS — R6520 Severe sepsis without septic shock: Secondary | ICD-10-CM | POA: Diagnosis not present

## 2019-09-29 DIAGNOSIS — N179 Acute kidney failure, unspecified: Secondary | ICD-10-CM | POA: Diagnosis not present

## 2019-09-29 DIAGNOSIS — E870 Hyperosmolality and hypernatremia: Secondary | ICD-10-CM | POA: Diagnosis not present

## 2019-09-29 DIAGNOSIS — R000 Tachycardia, unspecified: Secondary | ICD-10-CM | POA: Diagnosis not present

## 2019-09-29 DIAGNOSIS — R21 Rash and other nonspecific skin eruption: Secondary | ICD-10-CM | POA: Diagnosis not present

## 2019-09-29 LAB — COMPREHENSIVE METABOLIC PANEL
ALT: 51 U/L — ABNORMAL HIGH (ref 0–44)
AST: 128 U/L — ABNORMAL HIGH (ref 15–41)
Albumin: 2.5 g/dL — ABNORMAL LOW (ref 3.5–5.0)
Alkaline Phosphatase: 55 U/L (ref 38–126)
Anion gap: 15 (ref 5–15)
BUN: 50 mg/dL — ABNORMAL HIGH (ref 8–23)
CO2: 19 mmol/L — ABNORMAL LOW (ref 22–32)
Calcium: 8.1 mg/dL — ABNORMAL LOW (ref 8.9–10.3)
Chloride: 107 mmol/L (ref 98–111)
Creatinine, Ser: 1.78 mg/dL — ABNORMAL HIGH (ref 0.61–1.24)
GFR calc Af Amer: 38 mL/min — ABNORMAL LOW (ref >60)
GFR calc non Af Amer: 33 mL/min — ABNORMAL LOW (ref >60)
Glucose, Bld: 164 mg/dL — ABNORMAL HIGH (ref 70–99)
Potassium: 4 mmol/L (ref 3.5–5.1)
Sodium: 141 mmol/L (ref 135–145)
Total Bilirubin: 0.9 mg/dL (ref 0.3–1.2)
Total Protein: 6.9 g/dL (ref 6.5–8.1)

## 2019-09-29 LAB — CBC
HCT: 48.7 % (ref 39.0–52.0)
Hemoglobin: 15.2 g/dL (ref 13.0–17.0)
MCH: 27.1 pg (ref 26.0–34.0)
MCHC: 31.2 g/dL (ref 30.0–36.0)
MCV: 86.8 fL (ref 80.0–100.0)
Platelets: 79 10*3/uL — ABNORMAL LOW (ref 150–400)
RBC: 5.61 MIL/uL (ref 4.22–5.81)
RDW: 14.8 % (ref 11.5–15.5)
WBC: 9.3 10*3/uL (ref 4.0–10.5)
nRBC: 0 % (ref 0.0–0.2)

## 2019-09-29 LAB — PROTIME-INR
INR: 1.2 (ref 0.8–1.2)
Prothrombin Time: 14.4 seconds (ref 11.4–15.2)

## 2019-09-29 LAB — LACTIC ACID, PLASMA: Lactic Acid, Venous: 5.4 mmol/L (ref 0.5–1.9)

## 2019-09-29 LAB — APTT: aPTT: 27 seconds (ref 24–36)

## 2019-09-29 LAB — SARS CORONAVIRUS 2 BY RT PCR (HOSPITAL ORDER, PERFORMED IN ~~LOC~~ HOSPITAL LAB): SARS Coronavirus 2: POSITIVE — AB

## 2019-09-29 LAB — CBG MONITORING, ED: Glucose-Capillary: 127 mg/dL — ABNORMAL HIGH (ref 70–99)

## 2019-09-29 MED ORDER — LACTATED RINGERS IV SOLN: INTRAVENOUS | Status: AC

## 2019-09-29 MED ORDER — LACTATED RINGERS IV BOLUS (SEPSIS)
1000.0000 mL | Freq: Once | INTRAVENOUS | Status: AC
  Administered 2019-09-29: 1000 mL via INTRAVENOUS

## 2019-09-29 MED ORDER — SODIUM CHLORIDE 0.9 % IV SOLN
2.0000 g | INTRAVENOUS | Status: DC
  Administered 2019-09-29 – 2019-10-01 (×3): 2 g via INTRAVENOUS
  Filled 2019-09-29 (×3): qty 20

## 2019-09-29 MED ORDER — SODIUM CHLORIDE 0.9 % IV SOLN
500.0000 mg | INTRAVENOUS | Status: DC
  Administered 2019-09-29 – 2019-10-01 (×3): 500 mg via INTRAVENOUS
  Filled 2019-09-29 (×5): qty 500

## 2019-09-29 MED ORDER — SODIUM CHLORIDE 0.9 % IV SOLN
100.0000 mg | Freq: Every day | INTRAVENOUS | Status: AC
  Administered 2019-09-30 – 2019-10-03 (×4): 100 mg via INTRAVENOUS
  Filled 2019-09-29 (×4): qty 20

## 2019-09-29 MED ORDER — LACTATED RINGERS IV BOLUS (SEPSIS)
500.0000 mL | Freq: Once | INTRAVENOUS | Status: AC
  Administered 2019-09-29: 500 mL via INTRAVENOUS

## 2019-09-29 MED ORDER — DILTIAZEM HCL 30 MG PO TABS
30.0000 mg | ORAL_TABLET | Freq: Three times a day (TID) | ORAL | Status: DC
  Administered 2019-09-30 – 2019-10-01 (×3): 30 mg via ORAL
  Filled 2019-09-29 (×7): qty 1

## 2019-09-29 MED ORDER — ALBUTEROL SULFATE HFA 108 (90 BASE) MCG/ACT IN AERS
4.0000 | INHALATION_SPRAY | Freq: Once | RESPIRATORY_TRACT | Status: AC
  Administered 2019-09-30: 4 via RESPIRATORY_TRACT
  Filled 2019-09-29: qty 6.7

## 2019-09-29 MED ORDER — DEXAMETHASONE SODIUM PHOSPHATE 10 MG/ML IJ SOLN
10.0000 mg | Freq: Once | INTRAMUSCULAR | Status: AC
  Administered 2019-09-30: 10 mg via INTRAVENOUS
  Filled 2019-09-29: qty 1

## 2019-09-29 MED ORDER — ACETAMINOPHEN 650 MG RE SUPP
650.0000 mg | Freq: Once | RECTAL | Status: AC
  Administered 2019-09-29: 650 mg via RECTAL
  Filled 2019-09-29: qty 1

## 2019-09-29 MED ORDER — SODIUM CHLORIDE 0.9 % IV SOLN
200.0000 mg | Freq: Once | INTRAVENOUS | Status: AC
  Administered 2019-09-30: 200 mg via INTRAVENOUS
  Filled 2019-09-29: qty 200

## 2019-09-29 NOTE — ED Notes (Signed)
Dr Rodena Medin aware Lactic 5.4

## 2019-09-29 NOTE — ED Notes (Signed)
Daughter [name] would like updates, cell number is in the chart

## 2019-09-29 NOTE — ED Provider Notes (Signed)
[facility] EMERGENCY DEPARTMENT Provider Note   CSN: [id number] Arrival date & time: 09/29/19  1816     History Chief Complaint  Patient presents with  . Atrial Fibrillation  . Altered Mental Status   Level 5 caveat due to altered mental status [name] [name] is a 86 y.o. male with history of paroxysmal A. fib, cerebral aneurysm, DVT, nephrolithiasis presents brought in by EMS for evaluation of confusion.  Per triage note the patient was last known normal last night before bed.  Reportedly today he has not had anything to eat or drink and was "acting off".  EMS noted the patient to be in A. fib with RVR with heart rates in the one eighties and administered 20 mg Cardizem and 750 cc of normal saline and reported that the patient became more alert while on route to the hospital after meds were administered.  On my assessment the patient appears somewhat confused, but is oriented to self.  He denies any pain including abdominal pain and chest pain.  He has a frequent hacking cough.  He had a rather large bowel movement and his depends just prior to my assessment of light brown nonbloody stool.  The history is provided by the patient and the EMS personnel. The history is limited by the condition of the patient.       Past Medical History:  Diagnosis Date  . A-fib (HCC)   . Cerebral aneurysm    history of cerebral hemorrhage due to brain aneurysm rupture 25 yrs ago  . Complication of anesthesia    had Atrial Fib.- per patient  . DJD (degenerative joint disease)   . DVT (deep venous thrombosis) (HCC)    history of prior to amputation  . Dysrhythmia    PAF  . History of kidney stones    passed all except 1.  . HOH (hard of hearing)   . Peripheral arterial disease (HCC)   . Stroke [facility])    patient unaware  . Urethral stricture    history of dilated    Patient Active Problem List   Diagnosis Date Noted  . AKI (acute kidney injury) (HCC) 09/29/2019  . COVID-19  virus infection 09/29/2019  . Thrombocytopenia (HCC) 09/29/2019  . Atrial fibrillation with RVR (HCC) 07/24/2019  . History of cerebellar hemorrhage 07/24/2019  . History of DVT (deep vein thrombosis) 07/24/2019  . Pain due to onychomycosis of toenails of both feet 12/26/2018  . PVD (peripheral vascular disease) (HCC) 12/26/2018  . Coagulation defect (HCC) 12/26/2018  . CVA (cerebral vascular accident) (HCC) 06/08/2014  . TIA (transient ischemic attack) 05/30/2014  . Wound infection 06/22/2013  . Cerebellar hemorrhage, acute (HCC) 05/14/2013  . Peripheral arterial disease (HCC)   . DVT (deep venous thrombosis) (HCC)   . Atrial fibrillation (HCC) 06/19/2010  . Long term (current) use of anticoagulants 06/19/2010    Past Surgical History:  Procedure Laterality Date  . ABOVE KNEE LEG AMPUTATION     left  . APPENDECTOMY    . CYST EXCISION     above rectum  . CYSTOSCOPY     kidney stone  . HERNIA REPAIR Left   . LUMBAR WOUND DEBRIDEMENT N/A 06/22/2013   Procedure: incision and drainage of posterior cervical wound;  Surgeon: Cristi Loron, MD;  Location: [facility];  Service: Neurosurgery;  Laterality: N/A;  . PROSTATECTOMY    . SUBOCCIPITAL CRANIECTOMY CERVICAL LAMINECTOMY Left 05/13/2013   Procedure: Left SUBOCCIPITAL CRANIECTOMY;  Surgeon: Cristi Loron,  MD;  Location: [facility];  Service: Neurosurgery;  Laterality: Left;  . TONSILLECTOMY AND ADENOIDECTOMY    . VARICOSE VEIN SURGERY         Family History  Problem Relation Age of Onset  . Heart attack Father   . Breast cancer Daughter   . Breast cancer Mother   . Hypertension Other     Social History   Tobacco Use  . Smoking status: Never Smoker  . Smokeless tobacco: Never Used  Vaping Use  . Vaping Use: Never used  Substance Use Topics  . Alcohol use: No  . Drug use: No    Home Medications Prior to Admission medications   Medication Sig Start Date End Date Taking? Authorizing Provider  amiodarone  (PACERONE) 100 MG tablet Take 1 tablet (100 mg total) by mouth daily. 08/11/19   O'NealRonnald Ramp, MD  aspirin 81 MG chewable tablet Chew 1 tablet (81 mg total) by mouth daily. 07/29/19   Leroy Sea, MD  Diaper Rash Products (DESITIN) OINT Apply 1 application topically daily as needed (Breakouts on ankle).     [provider]  Multiple Vitamin (MULTIVITAMIN WITH MINERALS) TABS tablet Take 1 tablet by mouth every evening. [employer]    [provider]  pantoprazole (PROTONIX) 40 MG tablet Take 1 tablet (40 mg total) by mouth daily. 07/29/19   Leroy Sea, MD  VOLTAREN 1 % GEL Apply 1 application topically in the morning and at bedtime. Apply sparingly. 07/24/19   [provider]    Allergies    Coumadin [warfarin sodium]  Review of Systems   Review of Systems  Unable to perform ROS: Mental status change    Physical Exam Updated Vital Signs BP 107/78   Pulse (!) 59   Temp 100 F (37.8 C) (Rectal)   Resp (!) 26   SpO2 96%   Physical Exam Vitals and nursing note reviewed.  Constitutional:      General: He is not in acute distress.    Appearance: He is well-developed.  HENT:     Head: Normocephalic and atraumatic.     Mouth/Throat:     Mouth: Mucous membranes are dry.  Eyes:     General:        Right eye: No discharge.        Left eye: No discharge.     Extraocular Movements: Extraocular movements intact.     Conjunctiva/sclera: Conjunctivae normal.     Pupils: Pupils are equal, round, and reactive to light.  Neck:     Vascular: No JVD.     Trachea: No tracheal deviation.  Cardiovascular:     Rate and Rhythm: Tachycardia present. Rhythm irregular.     Comments: Extremities cool and mottled, peripheral pulses difficult to palpate. Pulmonary:     Breath sounds: Wheezing and rales present.     Comments: Tachypneic, SPO2 saturations 95% on room air.  Scattered wheezes and rales noted; rales more significant in the left lung  base. Abdominal:     General: Bowel sounds are normal. There is no distension.     Palpations: Abdomen is soft.     Tenderness: There is no abdominal tenderness. There is no guarding or rebound.  Musculoskeletal:     Comments: Status post left AKA.  Skin:    General: Skin is warm.     Findings: No erythema.  Neurological:     Mental Status: He is alert.     Comments: Speech is  dysarthric.  He is oriented to person and place but not time.  Moves all extremities spontaneously.  No facial droop noted.  Psychiatric:        Behavior: Behavior normal.     ED Results / Procedures / Treatments   Labs (all labs ordered are listed, but only abnormal results are displayed) Labs Reviewed  SARS CORONAVIRUS 2 BY RT PCR (HOSPITAL ORDER, PERFORMED IN [facility]) - Abnormal; Notable for the following components:      Result Value   SARS Coronavirus 2 POSITIVE (*)    All other components within normal limits  COMPREHENSIVE METABOLIC PANEL - Abnormal; Notable for the following components:   CO2 19 (*)    Glucose, Bld 164 (*)    BUN 50 (*)    Creatinine, Ser 1.78 (*)    Calcium 8.1 (*)    Albumin 2.5 (*)    AST 128 (*)    ALT 51 (*)    GFR calc non Af Amer 33 (*)    GFR calc Af Amer 38 (*)    All other components within normal limits  CBC - Abnormal; Notable for the following components:   Platelets 79 (*)    All other components within normal limits  LACTIC ACID, PLASMA - Abnormal; Notable for the following components:   Lactic Acid, Venous 5.4 (*)    All other components within normal limits  CBG MONITORING, ED - Abnormal; Notable for the following components:   Glucose-Capillary 127 (*)    All other components within normal limits  CULTURE, BLOOD (ROUTINE X 2)  CULTURE, BLOOD (ROUTINE X 2)  URINE CULTURE  PROTIME-INR  APTT  LACTIC ACID, PLASMA  URINALYSIS, ROUTINE W REFLEX MICROSCOPIC    EKG None  Radiology CT Head Wo Contrast  Result Date:  09/29/2019 CLINICAL DATA:  Atrial fibrillation with rapid ventricular response, confusion EXAM: CT HEAD WITHOUT CONTRAST TECHNIQUE: Contiguous axial images were obtained from the base of the skull through the vertex without intravenous contrast. COMPARISON:  07/25/2019 FINDINGS: Brain: Stable chronic small vessel ischemic changes are seen throughout the periventricular white matter and basal ganglia. Stable encephalomalacia a left cerebellar hemisphere. No acute infarct or hemorrhage. Lateral ventricles and midline structures are stable. No acute extra-axial fluid collections. No mass effect. Vascular: No hyperdense vessel or unexpected calcification. Skull: Postsurgical changes from left occipital craniotomy. No acute bony abnormalities. Sinuses/Orbits: No acute finding. Other: None. IMPRESSION: 1. Stable chronic ischemic changes.  No acute intracranial process. Electronically Signed   By: Sharlet Salina M.D.   On: 09/29/2019 21:43   DG Chest Port 1 View  Result Date: 09/29/2019 CLINICAL DATA:  86 year old male with concern for sepsis. EXAM: PORTABLE CHEST 1 VIEW COMPARISON:  Chest radiograph dated 07/24/2019. FINDINGS: Faint left lung base hazy densities, similar to prior radiograph and likely atelectasis. No focal consolidation, pleural effusion, pneumothorax. The cardiac silhouette is within limits. Atherosclerotic calcification of the aorta. No acute osseous pathology. IMPRESSION: No active disease. Electronically Signed   By: Elgie Collard M.D.   On: 09/29/2019 20:17    Procedures Procedures (including critical care time)  Medications Ordered in ED Medications  lactated ringers infusion ( Intravenous New Bag/Given 09/29/19 2248)  lactated ringers bolus 1,000 mL (0 mLs Intravenous Stopped 09/29/19 2207)    And  lactated ringers bolus 1,000 mL (0 mLs Intravenous Stopped 09/29/19 2339)    And  lactated ringers bolus 500 mL (500 mLs Intravenous New Bag/Given 09/29/19 2357)  cefTRIAXone (ROCEPHIN)  2 g in sodium chloride 0.9 % 100 mL IVPB (0 g Intravenous Stopped 09/29/19 2247)  azithromycin (ZITHROMAX) 500 mg in sodium chloride 0.9 % 250 mL IVPB (0 mg Intravenous Stopped 09/29/19 2207)  dexamethasone (DECADRON) injection 10 mg (has no administration in time range)  albuterol (VENTOLIN HFA) 108 (90 Base) MCG/ACT inhaler 4 puff (has no administration in time range)  remdesivir 200 mg in sodium chloride 0.9% 250 mL IVPB (has no administration in time range)    Followed by  remdesivir 100 mg in sodium chloride 0.9 % 100 mL IVPB (has no administration in time range)  diltiazem (CARDIZEM) tablet 30 mg (has no administration in time range)  acetaminophen (TYLENOL) suppository 650 mg (650 mg Rectal Given 09/29/19 2151)    ED Course  I have reviewed the triage vital signs and the nursing notes.  Pertinent labs & imaging results that were available during my care of the patient were reviewed by me and considered in my medical decision making (see chart for details).    MDM Rules/Calculators/A&P                          Cy BlamerJohn Egnew was evaluated in Emergency Department on 09/30/2019 for the symptoms described in the history of present illness. He was evaluated in the context of the global COVID-19 pandemic, which necessitated consideration that the patient might be at risk for infection with the SARS-CoV-2 virus that causes COVID-19. Institutional protocols and algorithms that pertain to the evaluation of patients at risk for COVID-19 are in a state of rapid change based on information released by regulatory bodies including the CDC and federal and state organizations. These policies and algorithms were followed during the patient's care in the ED.  Patient presenting brought in by EMS for evaluation of altered mental status, decreased oral intake.  He is borderline febrile with rectal temp of 100 F, found to be in A. fib with RVR with heart rates into the 170s, hypotensive.  Clinically appears dry.   He is altered, oriented to person and place, answer some questions but not others.  He is difficult to understand.  He has rales and wheezes on auscultation of the lungs.  Abdomen is soft and nontender.  Peripheral pulses are difficult to palpate and he is mottled.  Appears clamped down.  Given that he appears dehydrated and is hypotensive, will give 30 cc/kg fluid bolus to attempt to improve both heart rate and blood pressure prior to administering any rate controlling medications.  He is not anticoagulated due to prior intracranial hemorrhage so is not a candidate for cardioversion.  Lab work reviewed and interpreted by myself shows no leukocytosis, no anemia.  He has some thrombocytopenia and LFTs are mildly elevated, question possible hepatic involvement?.  Doubt acute surgical abdominal pathology given reassuring abdominal examination.  He has an apparent AKI with elevation in his BUN and creatinine compared to baseline.  Lactate is elevated today.  Code sepsis was initiated with administration of 30 cc/kg fluid bolus and IV antibiotics with presumed respiratory infection due to persistent cough on my initial assessment with adventitious breath sounds.  Chest x-ray shows no acute cardiopulmonary abnormalities though could be falsely reassuring in the setting of his dehydration.  CT scan shows no acute intracranial abnormalities.  His Covid test is positive.  Blood pressure minimally improved with IV fluids.  He remains tachycardic.  Dr. Rodena MedinMessick spoke with the patient's daughter who states that she makes  medical decisions for the patient.  She confirms verbally that the patient is DNR/DNI.  He will require admission for COVID-19 infection with severe dehydration and AKI.  Spoke with Dr. Mikeal Hawthorne with [facility] hospitalist service who agrees to some care of patient and bring him into the hospital for further evaluation and management     Final Clinical Impression(s) / ED Diagnoses Final diagnoses:   Atrial fibrillation with rapid ventricular response (HCC)  COVID-19 virus infection  Sepsis with acute renal failure and septic shock, due to unspecified organism, unspecified acute renal failure type [facility])    Rx / DC Orders ED Discharge Orders    None       Jeanie Sewer, PA-C 09/30/19 0005    Wynetta Fines, MD 10/01/19 [phone]

## 2019-09-29 NOTE — ED Triage Notes (Addendum)
Patient arrives to ED with complaints of Afib RVR with HR in 180's and confusion per wife. Pts LKN was last night before bed, pts wife stated that pt was acting off and has not ate or drank today. EMS gave 20mg  Cardizem & 750cc NS. Per EMS pt become more alert on ride to hospital after meds were given. Pt was admitted on 6/11, where he was found to be encephalopathic and in A. fib with RVR.

## 2019-09-29 NOTE — H&P (Signed)
History and Physical   [name] [name] [name] [name]:[id number] DOB: 1926-09-22 DOA: 09/29/2019  Referring MD/NP/PA: Dr. Rodena Medin  PCP: Elias Else, MD   Outpatient Specialists: None  Patient coming from: Home  Chief Complaint: Shortness of breath, tachycardia and cough  HPI: [name] [name] is a 86 y.o. male with medical history significant of atrial fibrillation, DVT, previous intracranial bleeds: No anticoagulation, degenerative joint disease dysrhythmia who presented to the ER with a fever and altered mental status.  He was weak.  He has been normal last night but now today he is weak.  He is acting not himself.  Patient complained of cough but no significant shortness of breath no wheeze.  When EMS arrived he was in A. fib with RVR his heart rate in the 170s to 180s.  He was given 20 mg of Cardizem and 750 cc normal saline bolus.  Patient became a little more alert and brought to the hospital.  In the ER he was seen and evaluated and COVID-19 came back positive.  Patient is only oriented to self.  No pain.  His hacking cough is consistent and continuous.  He is now being admitted with altered mental status A. fib with RVR COVID-19 infection.  He is now requiring oxygen.  Patient is a DNR/DNI.[name] [name]  ED Course: Temperature is 100 blood pressure 84/65 pulse 1 6 respirate of 32 oxygen sat 94% room air chemistry showed glucose of 164 otherwise BUN 50 creatinine 1.78 baseline is 1.09.  Calcium 8.1 AST 128.  Lactic acid 5.4 CBC literally within normal except for platelets of 79.  Head CT without contrast is negative and chest x-ray showed no acute findings.  Patient being admitted with A. fib with RVR and COVID-19 infection  Review of Systems: As per HPI otherwise 10 point review of systems negative.    Past Medical History:  Diagnosis Date  . A-fib (HCC)   . Cerebral aneurysm    history of cerebral hemorrhage due to brain aneurysm rupture 25 yrs ago  . Complication of anesthesia    had Atrial Fib.- per patient   . DJD (degenerative joint disease)   . DVT (deep venous thrombosis) (HCC)    history of prior to amputation  . Dysrhythmia    PAF  . History of kidney stones    passed all except 1.  . HOH (hard of hearing)   . Peripheral arterial disease (HCC)   . Stroke [facility])    patient unaware  . Urethral stricture    history of dilated    Past Surgical History:  Procedure Laterality Date  . ABOVE KNEE LEG AMPUTATION     left  . APPENDECTOMY    . CYST EXCISION     above rectum  . CYSTOSCOPY     kidney stone  . HERNIA REPAIR Left   . LUMBAR WOUND DEBRIDEMENT N/A 06/22/2013   Procedure: incision and drainage of posterior cervical wound;  Surgeon: Cristi Loron, MD;  Location: [facility]UR[facility];  Service: Neurosurgery;  Laterality: N/A;  . PROSTATECTOMY    . SUBOCCIPITAL CRANIECTOMY CERVICAL LAMINECTOMY Left 05/13/2013   Procedure: Left SUBOCCIPITAL CRANIECTOMY;  Surgeon: Cristi Loron, MD;  Location: [facility]UR[facility];  Service: Neurosurgery;  Laterality: Left;  . TONSILLECTOMY AND ADENOIDECTOMY    . VARICOSE VEIN SURGERY       reports that he has never smoked. He has never used smokeless tobacco. He reports that he does not drink alcohol and does not use drugs.  Allergies  Allergen  Reactions  . Coumadin [Warfarin Sodium] Other (See Comments)    Caused bleeding in brain    Family History  Problem Relation Age of Onset  . Heart attack Father   . Breast cancer Daughter   . Breast cancer Mother   . Hypertension Other      Prior to Admission medications   Medication Sig Start Date End Date Taking? Authorizing Provider  amiodarone (PACERONE) 100 MG tablet Take 1 tablet (100 mg total) by mouth daily. 08/11/19   O'NealRonnald Ramp, [place] Thomas, MD  aspirin 81 MG chewable tablet Chew 1 tablet (81 mg total) by mouth daily. 07/29/19   Leroy SeaSingh, Prashant K, MD  Diaper Rash Products (DESITIN) OINT Apply 1 application topically daily as needed (Breakouts on ankle).     [provider]  Multiple  Vitamin (MULTIVITAMIN WITH MINERALS) TABS tablet Take 1 tablet by mouth every evening. [employer]    [provider]  pantoprazole (PROTONIX) 40 MG tablet Take 1 tablet (40 mg total) by mouth daily. 07/29/19   Leroy SeaSingh, Prashant K, MD  VOLTAREN 1 % GEL Apply 1 application topically in the morning and at bedtime. Apply sparingly. 07/24/19   [provider]    Physical Exam: Vitals:   09/29/19 2116 09/29/19 2243 09/29/19 2245 09/29/19 2300  BP: (!) 92/57 (!) 101/41 (!) 101/41 104/70  Pulse:  (!) 59    Resp: (!) 32 (!) 32 (!) 29 (!) 26  Temp:      TempSrc:      SpO2:  100%        Constitutional: Chronically ill looking, frail, no obvious distress Vitals:   09/29/19 2116 09/29/19 2243 09/29/19 2245 09/29/19 2300  BP: (!) 92/57 (!) 101/41 (!) 101/41 104/70  Pulse:  (!) 59    Resp: (!) 32 (!) 32 (!) 29 (!) 26  Temp:      TempSrc:      SpO2:  100%     Eyes: PERRL, lids and conjunctivae normal ENMT: Mucous membranes are moist. Posterior pharynx clear of any exudate or lesions.Normal dentition.  Neck: normal, supple, no masses, no thyromegaly Respiratory: clear to auscultation bilaterally, no wheezing, no crackles. Normal respiratory effort. No accessory muscle use.  Cardiovascular: Irregularly irregular with tachycardia, no murmurs / rubs / gallops. No extremity edema. 2+ pedal pulses. No carotid bruits.  Abdomen: no tenderness, no masses palpated. No hepatosplenomegaly. Bowel sounds positive.  Musculoskeletal: no clubbing / cyanosis. No joint deformity upper and lower extremities. Good ROM, no contractures. Normal muscle tone.  Skin: no rashes, lesions, ulcers. No induration Neurologic: CN 2-12 grossly intact. Sensation intact, DTR normal. Strength 5/5 in all 4.  Psychiatric: Confused and disoriented.     Labs on Admission: I have personally reviewed following labs and imaging studies  CBC: Recent Labs  Lab 09/29/19 1832  WBC 9.3  HGB 15.2  HCT 48.7   MCV 86.8  PLT 79*   Basic Metabolic Panel: Recent Labs  Lab 09/29/19 1832  NA 141  K 4.0  CL 107  CO2 19*  GLUCOSE 164*  BUN 50*  CREATININE 1.78*  CALCIUM 8.1*   GFR: CrCl cannot be calculated (Unknown ideal weight.). Liver Function Tests: Recent Labs  Lab 09/29/19 1832  AST 128*  ALT 51*  ALKPHOS 55  BILITOT 0.9  PROT 6.9  ALBUMIN 2.5*   No results for input(s): LIPASE, AMYLASE in the last 168 hours. No results for input(s): AMMONIA in the last 168 hours. Coagulation Profile: Recent Labs  Lab 09/29/19 2026  INR 1.2   Cardiac Enzymes: No results for input(s): CKTOTAL, CKMB, CKMBINDEX, TROPONINI in the last 168 hours. BNP (last 3 results) No results for input(s): PROBNP in the last 8760 hours. HbA1C: No results for input(s): HGBA1C in the last 72 hours. CBG: Recent Labs  Lab 09/29/19 2304  GLUCAP 127*   Lipid Profile: No results for input(s): CHOL, HDL, LDLCALC, TRIG, CHOLHDL, LDLDIRECT in the last 72 hours. Thyroid Function Tests: No results for input(s): TSH, T4TOTAL, FREET4, T3FREE, THYROIDAB in the last 72 hours. Anemia Panel: No results for input(s): VITAMINB12, FOLATE, FERRITIN, TIBC, IRON, RETICCTPCT in the last 72 hours. Urine analysis:    Component Value Date/Time   COLORURINE AMBER (A) 07/25/2019 0602   APPEARANCEUR HAZY (A) 07/25/2019 0602   LABSPEC 1.024 07/25/2019 0602   PHURINE 5.0 07/25/2019 0602   GLUCOSEU NEGATIVE 07/25/2019 0602   HGBUR LARGE (A) 07/25/2019 0602   BILIRUBINUR NEGATIVE 07/25/2019 0602   KETONESUR NEGATIVE 07/25/2019 0602   PROTEINUR 100 (A) 07/25/2019 0602   UROBILINOGEN 0.2 05/30/2014 1358   NITRITE NEGATIVE 07/25/2019 0602   LEUKOCYTESUR MODERATE (A) 07/25/2019 0602   Sepsis Labs: @LABRCNTIP (procalcitonin:4,lacticidven:4) ) Recent Results (from the past 240 hour(s))  SARS Coronavirus 2 by RT PCR (hospital order, performed in [facility] hospital lab) Nasopharyngeal Nasopharyngeal Swab     Status: Abnormal    Collection Time: 09/29/19  8:26 PM   Specimen: Nasopharyngeal Swab  Result Value Ref Range Status   SARS Coronavirus 2 POSITIVE (A) NEGATIVE Final    Comment: RESULT CALLED TO, READ BACK BY AND VERIFIED WITH: G KOPP RN 2231 09/29/19 A BROWNING (NOTE) SARS-CoV-2 target nucleic acids are DETECTED  SARS-CoV-2 RNA is generally detectable in upper respiratory specimens  during the acute phase of infection.  Positive results are indicative  of the presence of the identified virus, but do not rule out bacterial infection or co-infection with other pathogens not detected by the test.  Clinical correlation with patient history and  other diagnostic information is necessary to determine patient infection status.  The expected result is negative.  Fact Sheet for Patients:   10/01/19   Fact Sheet for Healthcare Providers:   BoilerBrush.com.cy    This test is not yet approved or cleared by the https://pope.com/ FDA and  has been authorized for detection and/or diagnosis of SARS-CoV-2 by FDA under an Emergency Use Authorization (EUA).  This EUA will remain in effect (meaning this tes t can be used) for the duration of  the COVID-19 declaration under Section 564(b)(1) of the Act, 21 U.S.C. section 360-bbb-3(b)(1), unless the authorization is terminated or revoked sooner.  Performed at [facility], [address] [place], [place]      Radiological Exams on Admission: CT Head Wo Contrast  Result Date: 09/29/2019 CLINICAL DATA:  Atrial fibrillation with rapid ventricular response, confusion EXAM: CT HEAD WITHOUT CONTRAST TECHNIQUE: Contiguous axial images were obtained from the base of the skull through the vertex without intravenous contrast. COMPARISON:  07/25/2019 FINDINGS: Brain: Stable chronic small vessel ischemic changes are seen throughout the periventricular white matter and basal ganglia. Stable encephalomalacia a  left cerebellar hemisphere. No acute infarct or hemorrhage. Lateral ventricles and midline structures are stable. No acute extra-axial fluid collections. No mass effect. Vascular: No hyperdense vessel or unexpected calcification. Skull: Postsurgical changes from left occipital craniotomy. No acute bony abnormalities. Sinuses/Orbits: No acute finding. Other: None. IMPRESSION: 1. Stable chronic ischemic changes.  No acute intracranial process.  Electronically Signed   By: Sharlet Salina M.D.   On: 09/29/2019 21:43   DG Chest Port 1 View  Result Date: 09/29/2019 CLINICAL DATA:  86 year old male with concern for sepsis. EXAM: PORTABLE CHEST 1 VIEW COMPARISON:  Chest radiograph dated 07/24/2019. FINDINGS: Faint left lung base hazy densities, similar to prior radiograph and likely atelectasis. No focal consolidation, pleural effusion, pneumothorax. The cardiac silhouette is within limits. Atherosclerotic calcification of the aorta. No acute osseous pathology. IMPRESSION: No active disease. Electronically Signed   By: Elgie Collard M.D.   On: 09/29/2019 20:17    EKG: Independently reviewed.  Shows atrial fibrillation with rapid response arrest 160s to 170s  Assessment/Plan Principal Problem:   COVID-19 virus infection Active Problems:   PVD (peripheral vascular disease) (HCC)   Atrial fibrillation with RVR (HCC)   History of DVT (deep vein thrombosis)   AKI (acute kidney injury) (HCC)   Thrombocytopenia (HCC)     #1 COVID-19 infection: Incidental findings but could be the trigger for patient's symptoms.  Currently having only cough.  Not hypoxic.  Chest x-ray did not show infiltrate but could be early.  Patient will be treated with dexamethasone remdesivir and supportive care.  Monitor closely and if oxygen is required we will start it.  #2 A. fib with RVR: Aggressive hydration.  Patient is on amiodarone from home that we will continue with.  As needed Cardizem or beta-blocker.  Last echocardiogram  was 2 months ago with EF of 60 to 65%.  No need to repeat 1.  #3 peripheral vascular disease: IV hydration close monitoring  #4 history of DVT and PE: Off anticoagulation secondary to previous intracranial bleed.  #5 acute kidney injury: Most likely prerenal.  Aggressive hydration closely monitor renal function.  #6 altered mental status: Most likely secondary to acute illness.    DVT prophylaxis: SCD Code Status: DNR/DNI Family Communication: Daughter over the phone Disposition Plan: To be determined Consults called: None Admission status: Inpatient  Severity of Illness: The appropriate patient status for this patient is INPATIENT. Inpatient status is judged to be reasonable and necessary in order to provide the required intensity of service to ensure the patient's safety. The patient's presenting symptoms, physical exam findings, and initial radiographic and laboratory data in the context of their chronic comorbidities is felt to place them at high risk for further clinical deterioration. Furthermore, it is not anticipated that the patient will be medically stable for discharge from the hospital within 2 midnights of admission. The following factors support the patient status of inpatient.   " The patient's presenting symptoms include A. fib with RVR and shortness of breath. " The worrisome physical exam findings include history of A. fib. " The initial radiographic and laboratory data are worrisome because of COVID-19 positive. " The chronic co-morbidities include A. fib.   * I certify that at the point of admission it is my clinical judgment that the patient will require inpatient hospital care spanning beyond 2 midnights from the point of admission due to high intensity of service, high risk for further deterioration and high frequency of surveillance required.Lonia Blood MD [facility] Pager [phone]  If 7PM-7AM, please contact  night-coverage [email or web address] Password [facility]  09/29/2019, 11:29 PM

## 2019-09-29 NOTE — ED Provider Notes (Signed)
Patient's daughter, Johnny Bridge updated (over the phone) regarding the patient's plan of care.    Patient will be admitted.    The patient's daughter - Ms. Hale Bogus - verbally reports to this provider the patient is DNR/DNI.   Wynetta Fines, MD 09/29/19 2253

## 2019-09-30 LAB — BLOOD CULTURE ID PANEL (REFLEXED) - BCID2

## 2019-09-30 LAB — RENAL FUNCTION PANEL
Albumin: 2 g/dL — ABNORMAL LOW (ref 3.5–5.0)
Anion gap: 11 (ref 5–15)
BUN: 43 mg/dL — ABNORMAL HIGH (ref 8–23)
CO2: 21 mmol/L — ABNORMAL LOW (ref 22–32)
Calcium: 7.4 mg/dL — ABNORMAL LOW (ref 8.9–10.3)
Chloride: 109 mmol/L (ref 98–111)
Creatinine, Ser: 1.35 mg/dL — ABNORMAL HIGH (ref 0.61–1.24)
GFR calc Af Amer: 53 mL/min — ABNORMAL LOW (ref >60)
GFR calc non Af Amer: 46 mL/min — ABNORMAL LOW (ref >60)
Glucose, Bld: 133 mg/dL — ABNORMAL HIGH (ref 70–99)
Phosphorus: 3.2 mg/dL (ref 2.5–4.6)
Potassium: 3.9 mmol/L (ref 3.5–5.1)
Sodium: 141 mmol/L (ref 135–145)

## 2019-09-30 LAB — LACTIC ACID, PLASMA: Lactic Acid, Venous: 2.3 mmol/L (ref 0.5–1.9)

## 2019-09-30 LAB — ABO/RH: ABO/RH(D): O POS

## 2019-09-30 LAB — MAGNESIUM: Magnesium: 1.6 mg/dL — ABNORMAL LOW (ref 1.7–2.4)

## 2019-09-30 MED ORDER — REMDESIVIR 100 MG IV SOLR
100.0000 mg | Freq: Every day | INTRAVENOUS | Status: DC
Start: 2019-10-01 — End: 2019-09-30

## 2019-09-30 MED ORDER — ONDANSETRON HCL 4 MG PO TABS: 4.0000 mg | ORAL_TABLET | Freq: Four times a day (QID) | ORAL | Status: DC | PRN

## 2019-09-30 MED ORDER — ALBUTEROL SULFATE HFA 108 (90 BASE) MCG/ACT IN AERS
2.0000 | INHALATION_SPRAY | Freq: Four times a day (QID) | RESPIRATORY_TRACT | Status: DC
  Administered 2019-09-30 – 2019-10-04 (×16): 2 via RESPIRATORY_TRACT
  Filled 2019-09-30 (×2): qty 6.7

## 2019-09-30 MED ORDER — SODIUM CHLORIDE 0.9 % IV SOLN: 200.0000 mg | Freq: Once | INTRAVENOUS | Status: DC

## 2019-09-30 MED ORDER — ONDANSETRON HCL 4 MG/2ML IJ SOLN: 4.0000 mg | Freq: Four times a day (QID) | INTRAMUSCULAR | Status: DC | PRN

## 2019-09-30 MED ORDER — MAGNESIUM SULFATE 2 GM/50ML IV SOLN
2.0000 g | Freq: Once | INTRAVENOUS | Status: AC
  Administered 2019-09-30: 2 g via INTRAVENOUS
  Filled 2019-09-30: qty 50

## 2019-09-30 MED ORDER — ZINC SULFATE 220 (50 ZN) MG PO CAPS
220.0000 mg | ORAL_CAPSULE | Freq: Every day | ORAL | Status: DC
  Administered 2019-09-30 – 2019-10-04 (×5): 220 mg via ORAL
  Filled 2019-09-30 (×6): qty 1

## 2019-09-30 MED ORDER — ACETAMINOPHEN 325 MG PO TABS: 650.0000 mg | ORAL_TABLET | Freq: Four times a day (QID) | ORAL | Status: DC | PRN

## 2019-09-30 MED ORDER — HYDROCOD POLST-CPM POLST ER 10-8 MG/5ML PO SUER
5.0000 mL | Freq: Two times a day (BID) | ORAL | Status: DC | PRN
  Administered 2019-10-03 – 2019-10-04 (×2): 5 mL via ORAL
  Filled 2019-09-30 (×2): qty 5

## 2019-09-30 MED ORDER — SODIUM CHLORIDE 0.9 % IV SOLN: INTRAVENOUS | Status: DC

## 2019-09-30 MED ORDER — METOPROLOL TARTRATE 5 MG/5ML IV SOLN
2.5000 mg | Freq: Once | INTRAVENOUS | Status: AC
  Administered 2019-09-30: 2.5 mg via INTRAVENOUS
  Filled 2019-09-30: qty 5

## 2019-09-30 MED ORDER — DEXAMETHASONE SODIUM PHOSPHATE 10 MG/ML IJ SOLN
6.0000 mg | INTRAMUSCULAR | Status: DC
  Administered 2019-09-30 – 2019-10-04 (×5): 6 mg via INTRAVENOUS
  Filled 2019-09-30 (×5): qty 1

## 2019-09-30 MED ORDER — ASCORBIC ACID 500 MG PO TABS
500.0000 mg | ORAL_TABLET | Freq: Every day | ORAL | Status: DC
  Administered 2019-09-30 – 2019-10-04 (×5): 500 mg via ORAL
  Filled 2019-09-30 (×6): qty 1

## 2019-09-30 MED ORDER — GUAIFENESIN-DM 100-10 MG/5ML PO SYRP
10.0000 mL | ORAL_SOLUTION | ORAL | Status: DC | PRN
  Administered 2019-10-04: 10 mL via ORAL
  Filled 2019-09-30: qty 10

## 2019-09-30 MED ORDER — SODIUM CHLORIDE 0.9 % IV SOLN: 500.0000 mg | INTRAVENOUS | Status: DC

## 2019-09-30 MED ORDER — AMIODARONE HCL 200 MG PO TABS: 100.0000 mg | ORAL_TABLET | Freq: Every day | ORAL | Status: DC

## 2019-09-30 MED ORDER — AMIODARONE HCL 200 MG PO TABS
100.0000 mg | ORAL_TABLET | Freq: Every day | ORAL | Status: DC
  Administered 2019-09-30 – 2019-10-01 (×2): 100 mg via ORAL
  Filled 2019-09-30 (×2): qty 1

## 2019-09-30 NOTE — Progress Notes (Signed)
PROGRESS NOTE    [name] [name]  OMB:[id number] DOB: [birth year] DOA: 09/29/2019 PCP: Elias Else, MD    Brief Narrative:  86 year old male with history of paroxysmal A. fib on amiodarone, not on anticoagulation because of previous severe bleeding, history of DVT presented to the emergency room for on and off confusion.  Lives at home.  He was acting off as per the family.  When EMS arrived at home, he was found with A. fib with RVR with heart rate 180.  They started patient on 20 mg of bolus Cardizem and IV fluids and brought to the ER.  In the emergency room, temperature 100, blood pressure 84/65, respiratory 32.  94% on room air.  AKI.  Mildly elevated transaminases.  Chest x-ray normal.  Head CT normal.  COVID-19 positive.  EKG with rapid A. fib.  Lactic acid 5.4.   Assessment & Plan:   Principal Problem:   COVID-19 virus infection Active Problems:   PVD (peripheral vascular disease) (HCC)   Atrial fibrillation with RVR (HCC)   History of DVT (deep vein thrombosis)   AKI (acute kidney injury) (HCC)   Thrombocytopenia (HCC)  Sepsis present on admission with lactic acidosis, hypotension in the setting of COVID-19 viral infection: Continue to monitor due to significant symptoms  chest physiotherapy, incentive spirometry, deep breathing exercises, sputum induction, mucolytic's and bronchodilators as much patient tolerates. Supplemental oxygen to keep saturations more than 90%. Covid directed therapy with , steroids dexamethasone daily remdesivir day 2/5 antibiotics, treating for sepsis of unknown origin.  Continue on Rocephin and azithromycin. Due to severity of symptoms, patient will need daily inflammatory markers, chest x-rays, liver function test to monitor and direct COVID-19 therapies.  COVID-19 Labs  No results for input(s): DDIMER, FERRITIN, LDH, CRP in the last 72 hours.  Lab Results  Component Value Date   SARSCOV2NAA POSITIVE (A) 09/29/2019   SARSCOV2NAA NEGATIVE  07/24/2019   SpO2: 100 %   Chronic A. fib with RVR: Initially treated with Cardizem drip.  Now started on amiodarone and oral Cardizem with good control of heart rate.  Not an anticoagulation candidate.  Acute metabolic encephalopathy: Unknown baseline.  Probably has baseline dementia.  No focal deficit.  Acute kidney injury on chronic kidney disease stage II: Improved with treatment.  Continue to monitor.   DVT prophylaxis: SCDs Start: 09/30/19 4536   Code Status: DNR Family Communication: Tried calling daughter on the number provided, home number is nonexistent.  Cell phone number goes to office call and states that she is returning on 24th. Disposition Plan: Status is: Inpatient  Remains inpatient appropriate because:IV treatments appropriate due to intensity of illness or inability to take PO   Dispo: The patient is from: Home              Anticipated d/c is to: Home              Anticipated d/c date is: 3 days              Patient currently is not medically stable to d/c.         Consultants:   None  Procedures:   None  Antimicrobials:  Antibiotics Given (last 72 hours)    Date/Time Action Medication Dose Rate   09/29/19 2045 New Bag/Given   azithromycin (ZITHROMAX) 500 mg in sodium chloride 0.9 % 250 mL IVPB 500 mg 250 mL/hr   09/29/19 2208 New Bag/Given   cefTRIAXone (ROCEPHIN) 2 g in sodium chloride 0.9 % 100 mL  IVPB 2 g 200 mL/hr   09/30/19 0108 New Bag/Given   remdesivir 200 mg in sodium chloride 0.9% 250 mL IVPB 200 mg 580 mL/hr   09/30/19 1059 New Bag/Given   remdesivir 100 mg in sodium chloride 0.9 % 100 mL IVPB 100 mg 200 mL/hr         Subjective: Patient was seen and examined.  Remains in the emergency room waiting for inpatient bed assignment.  Heart rate is mostly controlled less than 110.  Patient is pleasantly confused, on room air.  Denies any complaints.  Objective: Vitals:   09/30/19 1144 09/30/19 1145 09/30/19 1200 09/30/19 1201   BP:  (!) 110/95  116/82  Pulse: 88   79  Resp:   19   Temp:      TempSrc:      SpO2:  94%  100%    Intake/Output Summary (Last 24 hours) at 09/30/2019 1722 Last data filed at 09/30/2019 1453 Gross per 24 hour  Intake 1351.88 ml  Output --  Net 1351.88 ml   There were no vitals filed for this visit.  Examination:  General exam: Appears calm and comfortable  Pleasantly confused and disoriented.  Not in any distress.  On room air. Respiratory system: Clear to auscultation. Respiratory effort normal.  Some conducted airway sounds. Cardiovascular system: S1 & S2 heard, irregularly irregular.  No JVD, murmurs, rubs, gallops or clicks. No pedal edema. Left above-knee amputation stump clean and dry. Gastrointestinal system: Abdomen is nondistended, soft and nontender. No organomegaly or masses felt. Normal bowel sounds heard.     Data Reviewed: I have personally reviewed following labs and imaging studies  CBC: Recent Labs  Lab 09/29/19 1832  WBC 9.3  HGB 15.2  HCT 48.7  MCV 86.8  PLT 79*   Basic Metabolic Panel: Recent Labs  Lab 09/29/19 1832 09/30/19 0338  NA 141 141  K 4.0 3.9  CL 107 109  CO2 19* 21*  GLUCOSE 164* 133*  BUN 50* 43*  CREATININE 1.78* 1.35*  CALCIUM 8.1* 7.4*  MG  --  1.6*  PHOS  --  3.2   GFR: CrCl cannot be calculated (Unknown ideal weight.). Liver Function Tests: Recent Labs  Lab 09/29/19 1832 09/30/19 0338  AST 128*  --   ALT 51*  --   ALKPHOS 55  --   BILITOT 0.9  --   PROT 6.9  --   ALBUMIN 2.5* 2.0*   No results for input(s): LIPASE, AMYLASE in the last 168 hours. No results for input(s): AMMONIA in the last 168 hours. Coagulation Profile: Recent Labs  Lab 09/29/19 2026  INR 1.2   Cardiac Enzymes: No results for input(s): CKTOTAL, CKMB, CKMBINDEX, TROPONINI in the last 168 hours. BNP (last 3 results) No results for input(s): PROBNP in the last 8760 hours. HbA1C: No results for input(s): HGBA1C in the last 72  hours. CBG: Recent Labs  Lab 09/29/19 2304  GLUCAP 127*   Lipid Profile: No results for input(s): CHOL, HDL, LDLCALC, TRIG, CHOLHDL, LDLDIRECT in the last 72 hours. Thyroid Function Tests: No results for input(s): TSH, T4TOTAL, FREET4, T3FREE, THYROIDAB in the last 72 hours. Anemia Panel: No results for input(s): VITAMINB12, FOLATE, FERRITIN, TIBC, IRON, RETICCTPCT in the last 72 hours. Sepsis Labs: Recent Labs  Lab 09/29/19 1836 09/30/19 0338  LATICACIDVEN 5.4* 2.3*    Recent Results (from the past 240 hour(s))  Blood Culture (routine x 2)     Status: None (Preliminary result)   Collection Time:  09/29/19  8:26 PM   Specimen: BLOOD  Result Value Ref Range Status   Specimen Description BLOOD RIGHT ANTECUBITAL  Final   Special Requests   Final    BOTTLES DRAWN AEROBIC AND ANAEROBIC Blood Culture adequate volume   Culture   Final    NO GROWTH < 12 HOURS Performed at [facility], [address] [place], [place] [address]    Report Status PENDING  Incomplete  SARS Coronavirus 2 by RT PCR (hospital order, performed in [facility] hospital lab) Nasopharyngeal Nasopharyngeal Swab     Status: Abnormal   Collection Time: 09/29/19  8:26 PM   Specimen: Nasopharyngeal Swab  Result Value Ref Range Status   SARS Coronavirus 2 POSITIVE (A) NEGATIVE Final    Comment: RESULT CALLED TO, READ BACK BY AND VERIFIED WITH: G KOPP RN 2231 09/29/19 A BROWNING (NOTE) SARS-CoV-2 target nucleic acids are DETECTED  SARS-CoV-2 RNA is generally detectable in upper respiratory specimens  during the acute phase of infection.  Positive results are indicative  of the presence of the identified virus, but do not rule out bacterial infection or co-infection with other pathogens not detected by the test.  Clinical correlation with patient history and  other diagnostic information is necessary to determine patient infection status.  The expected result is negative.  Fact Sheet for Patients:    BoilerBrush.com.cy   Fact Sheet for Healthcare Providers:   https://pope.com/    This test is not yet approved or cleared by the [place] FDA and  has been authorized for detection and/or diagnosis of SARS-CoV-2 by FDA under an Emergency Use Authorization (EUA).  This EUA will remain in effect (meaning this tes t can be used) for the duration of  the COVID-19 declaration under Section 564(b)(1) of the Act, 21 U.S.C. section 360-bbb-3(b)(1), unless the authorization is terminated or revoked sooner.  Performed at [facility], [address], [place], [place] [address]   Blood Culture (routine x 2)     Status: None (Preliminary result)   Collection Time: 09/29/19  8:50 PM   Specimen: BLOOD RIGHT HAND  Result Value Ref Range Status   Specimen Description BLOOD RIGHT HAND  Final   Special Requests   Final    BOTTLES DRAWN AEROBIC ONLY Blood Culture results may not be optimal due to an inadequate volume of blood received in culture bottles   Culture   Final    NO GROWTH < 12 HOURS Performed at [facility], [address], [place], [place] [address]    Report Status PENDING  Incomplete         Radiology Studies: CT Head Wo Contrast  Result Date: 09/29/2019 CLINICAL DATA:  Atrial fibrillation with rapid ventricular response, confusion EXAM: CT HEAD WITHOUT CONTRAST TECHNIQUE: Contiguous axial images were obtained from the base of the skull through the vertex without intravenous contrast. COMPARISON:  07/25/2019 FINDINGS: Brain: Stable chronic small vessel ischemic changes are seen throughout the periventricular white matter and basal ganglia. Stable encephalomalacia a left cerebellar hemisphere. No acute infarct or hemorrhage. Lateral ventricles and midline structures are stable. No acute extra-axial fluid collections. No mass effect. Vascular: No hyperdense vessel or unexpected calcification. Skull: Postsurgical changes  from left occipital craniotomy. No acute bony abnormalities. Sinuses/Orbits: No acute finding. Other: None. IMPRESSION: 1. Stable chronic ischemic changes.  No acute intracranial process. Electronically Signed   By: Sharlet Salina M.D.   On: 09/29/2019 21:43   DG Chest Port 1  View  Result Date: 09/29/2019 CLINICAL DATA:  86 year old male with concern for sepsis. EXAM: PORTABLE CHEST 1 VIEW COMPARISON:  Chest radiograph dated 07/24/2019. FINDINGS: Faint left lung base hazy densities, similar to prior radiograph and likely atelectasis. No focal consolidation, pleural effusion, pneumothorax. The cardiac silhouette is within limits. Atherosclerotic calcification of the aorta. No acute osseous pathology. IMPRESSION: No active disease. Electronically Signed   By: Elgie Collard M.D.   On: 09/29/2019 20:17        Scheduled Meds: . albuterol  2 puff Inhalation Q6H  . amiodarone  100 mg Oral Daily  . vitamin C  500 mg Oral Daily  . dexamethasone (DECADRON) injection  6 mg Intravenous Q24H  . diltiazem  30 mg Oral Q8H  . zinc sulfate  220 mg Oral Daily   Continuous Infusions: . sodium chloride 125 mL/hr at 09/30/19 1055  . azithromycin Stopped (09/29/19 2207)  . cefTRIAXone (ROCEPHIN)  IV Stopped (09/29/19 2247)  . remdesivir 100 mg in NS 100 mL Stopped (09/30/19 1453)     LOS: 1 day    Time spent: 30 minutes    Dorcas Carrow, MD [facility] Pager [phone]

## 2019-09-30 NOTE — Significant Event (Signed)
HOSPITAL MEDICINE OVERNIGHT EVENT NOTE    Notified by nursing of patient's continued Afib with RvR.    Admitting provider note reviewed.  In accordance with their plan concerning the Afib, will resume home regimen of Amiodarone.  Electrolytes checked.  Magnesium found to be 1.6 so will order 2 grams of Magnesium Sulfate.  Lactic acidosis is improving, from 5.4 on admission to 2.3 this morning so will continue resuscitation with IV fluids.  Will also give a small dose of 2.5mg  IV Metoprolol to assist with rate control.  Will reassess and re dose as necessary.    Changing bed request to progressive bed.   Marinda Elk  MD [facility]

## 2019-09-30 NOTE — Progress Notes (Signed)
PHARMACY - PHYSICIAN COMMUNICATION CRITICAL VALUE ALERT - BLOOD CULTURE IDENTIFICATION (BCID)  [name] [name] is an 86 y.o. male who presented to [facility] on 09/29/2019 with a chief complaint of SOB/afib w/ RVR and noted COVID +  Assessment: blood cultures show GPC/clusters in 1/3 bottles and BCID shows staph epic (MEC +). Likely contaminant  Name of physician (or Provider) Contacted:  Dr. Rachael Darby  Current antibiotics: rocephin and azithromycin  Changes to prescribed antibiotics recommended:  Patient is on recommended antibiotics - No changes needed  Results for orders placed or performed during the hospital encounter of 09/29/19  Blood Culture ID Panel (Reflexed) (Collected: 09/29/2019  8:26 PM)  Result Value Ref Range   Enterococcus faecalis NOT DETECTED NOT DETECTED   Enterococcus Faecium NOT DETECTED NOT DETECTED   Listeria monocytogenes NOT DETECTED NOT DETECTED   Staphylococcus species DETECTED (A) NOT DETECTED   Staphylococcus aureus (BCID) NOT DETECTED NOT DETECTED   Staphylococcus epidermidis DETECTED (A) NOT DETECTED   Staphylococcus lugdunensis NOT DETECTED NOT DETECTED   Streptococcus species NOT DETECTED NOT DETECTED   Streptococcus agalactiae NOT DETECTED NOT DETECTED   Streptococcus pneumoniae NOT DETECTED NOT DETECTED   Streptococcus pyogenes NOT DETECTED NOT DETECTED   A.calcoaceticus-baumannii NOT DETECTED NOT DETECTED   Bacteroides fragilis NOT DETECTED NOT DETECTED   Enterobacterales NOT DETECTED NOT DETECTED   Enterobacter cloacae complex NOT DETECTED NOT DETECTED   Escherichia coli NOT DETECTED NOT DETECTED   Klebsiella aerogenes NOT DETECTED NOT DETECTED   Klebsiella oxytoca NOT DETECTED NOT DETECTED   Klebsiella pneumoniae NOT DETECTED NOT DETECTED   Proteus species NOT DETECTED NOT DETECTED   Salmonella species NOT DETECTED NOT DETECTED   Serratia marcescens NOT DETECTED NOT DETECTED   Haemophilus influenzae NOT DETECTED NOT DETECTED   Neisseria  meningitidis NOT DETECTED NOT DETECTED   Pseudomonas aeruginosa NOT DETECTED NOT DETECTED   Stenotrophomonas maltophilia NOT DETECTED NOT DETECTED   Candida albicans NOT DETECTED NOT DETECTED   Candida auris NOT DETECTED NOT DETECTED   Candida glabrata NOT DETECTED NOT DETECTED   Candida krusei NOT DETECTED NOT DETECTED   Candida parapsilosis NOT DETECTED NOT DETECTED   Candida tropicalis NOT DETECTED NOT DETECTED   Cryptococcus neoformans/gattii NOT DETECTED NOT DETECTED   Methicillin resistance mecA/C DETECTED (A) NOT DETECTED    Benny Lennert 09/30/2019  8:09 PM

## 2019-09-30 NOTE — ED Notes (Addendum)
Tech went in to round on pt and in a pt belongings bag were his heavily soiled shorts, tech made pt aware they were in their and pt stated "just throw them away". Tech asked the pt "Are you sure" pt stated yes.

## 2019-10-01 DIAGNOSIS — Z7189 Other specified counseling: Secondary | ICD-10-CM

## 2019-10-01 DIAGNOSIS — Z515 Encounter for palliative care: Secondary | ICD-10-CM

## 2019-10-01 DIAGNOSIS — Z66 Do not resuscitate: Secondary | ICD-10-CM

## 2019-10-01 DIAGNOSIS — I4891 Unspecified atrial fibrillation: Secondary | ICD-10-CM

## 2019-10-01 DIAGNOSIS — Z789 Other specified health status: Secondary | ICD-10-CM

## 2019-10-01 DIAGNOSIS — N179 Acute kidney failure, unspecified: Secondary | ICD-10-CM

## 2019-10-01 LAB — CBC
HCT: 38.7 % — ABNORMAL LOW (ref 39.0–52.0)
Hemoglobin: 12.1 g/dL — ABNORMAL LOW (ref 13.0–17.0)
MCH: 26.9 pg (ref 26.0–34.0)
MCHC: 31.3 g/dL (ref 30.0–36.0)
MCV: 86.2 fL (ref 80.0–100.0)
Platelets: 65 10*3/uL — ABNORMAL LOW (ref 150–400)
RBC: 4.49 MIL/uL (ref 4.22–5.81)
RDW: 15.4 % (ref 11.5–15.5)
WBC: 6.2 10*3/uL (ref 4.0–10.5)
nRBC: 0 % (ref 0.0–0.2)

## 2019-10-01 LAB — COMPREHENSIVE METABOLIC PANEL
ALT: 43 U/L (ref 0–44)
AST: 102 U/L — ABNORMAL HIGH (ref 15–41)
Albumin: 2 g/dL — ABNORMAL LOW (ref 3.5–5.0)
Alkaline Phosphatase: 41 U/L (ref 38–126)
Anion gap: 9 (ref 5–15)
BUN: 40 mg/dL — ABNORMAL HIGH (ref 8–23)
CO2: 22 mmol/L (ref 22–32)
Calcium: 7.4 mg/dL — ABNORMAL LOW (ref 8.9–10.3)
Chloride: 110 mmol/L (ref 98–111)
Creatinine, Ser: 1.19 mg/dL (ref 0.61–1.24)
GFR calc Af Amer: >60 mL/min (ref >60)
GFR calc non Af Amer: 53 mL/min — ABNORMAL LOW (ref >60)
Glucose, Bld: 132 mg/dL — ABNORMAL HIGH (ref 70–99)
Potassium: 4.3 mmol/L (ref 3.5–5.1)
Sodium: 141 mmol/L (ref 135–145)
Total Bilirubin: 0.6 mg/dL (ref 0.3–1.2)
Total Protein: 5.4 g/dL — ABNORMAL LOW (ref 6.5–8.1)

## 2019-10-01 LAB — D-DIMER, QUANTITATIVE: D-Dimer, Quant: 9.28 ug/mL-FEU — ABNORMAL HIGH (ref 0.00–0.50)

## 2019-10-01 LAB — C-REACTIVE PROTEIN: CRP: 4.3 mg/dL — ABNORMAL HIGH (ref <1.0)

## 2019-10-01 LAB — FERRITIN: Ferritin: 1821 ng/mL — ABNORMAL HIGH (ref 24–336)

## 2019-10-01 LAB — MAGNESIUM: Magnesium: 2.1 mg/dL (ref 1.7–2.4)

## 2019-10-01 MED ORDER — METOPROLOL TARTRATE 25 MG PO TABS
25.0000 mg | ORAL_TABLET | Freq: Two times a day (BID) | ORAL | Status: DC
  Administered 2019-10-01 – 2019-10-02 (×2): 25 mg via ORAL
  Filled 2019-10-01 (×3): qty 1

## 2019-10-01 NOTE — ED Notes (Signed)
Ordered Breafast °

## 2019-10-01 NOTE — Plan of Care (Signed)
  Problem: Clinical Measurements: Goal: Will remain free from infection Outcome: Progressing Goal: Diagnostic test results will improve Outcome: Progressing   Problem: Education: Goal: Knowledge of General Education information will improve Description: Including pain rating scale, medication(s)/side effects and non-pharmacologic comfort measures Outcome: Progressing   Problem: Health Behavior/Discharge Planning: Goal: Ability to manage health-related needs will improve Outcome: Progressing   Problem: Clinical Measurements: Goal: Cardiovascular complication will be avoided Outcome: Progressing   Problem: Safety: Goal: Ability to remain free from injury will improve Outcome: Progressing   Problem: Skin Integrity: Goal: Risk for impaired skin integrity will decrease Outcome: Progressing   Problem: Education: Goal: Knowledge of risk factors and measures for prevention of condition will improve Outcome: Progressing   Problem: Respiratory: Goal: Will maintain a patent airway Outcome: Progressing   Problem: Activity: Goal: Risk for activity intolerance will decrease Outcome: Not Progressing

## 2019-10-01 NOTE — Progress Notes (Signed)
Civil engineer, contracting [facility]) Community Based Palliative Care       This patient is enrolled in our palliative care services in the community.   Spoke with his daughter [name] who states she has sold her home here and is moving to [place]. Hospice was recommended but she would need to have services provided by a hospice near her home in [place]. I have advised the hospital CSW of the situation and asked for a social work consult to assist with this process.   If you have questions or need assistance, please call [phone] or contact the hospital Liaison listed on AMION.        Elsie Saas, RN, [facility]  [facility] Liaison   [phone]

## 2019-10-01 NOTE — Progress Notes (Signed)
Pt arrived to unit via stretcher from ED. He is awake, alert and confused to place, time and situation. Oriented to self. Pt incontinent of stool in brief. Incontinent care provided. Placed on all monitoring equipment. [employer] called. Skin assessed by this nurse and second nurse (see skin charting in flow sheet) Pt unable to answer in depth questions due to confusion. He does deny pain or discomfort. States he is "tired". IV infusing via pump as ordered through patent IV. Will continue to monitor.

## 2019-10-01 NOTE — ED Notes (Signed)
Johnny Bridge daughter [phone] would like to speak with a social worker about the pt

## 2019-10-01 NOTE — Progress Notes (Signed)
PROGRESS NOTE    [name] [name]  ZOX:[id number] DOB: [birth year] DOA: 09/29/2019 PCP: Elias Else, MD    Brief Narrative:  86 year old male with history of paroxysmal A. fib on metoprolol, not on anticoagulation because of previous severe bleeding, history of DVT presented to the emergency room for on and off confusion.  Lives at home with daughter.  He was acting off as per the family.  When EMS arrived at home, he was found with A. fib with RVR with heart rate 180.  They started patient on 20 mg of bolus Cardizem and IV fluids and brought to the ER.  In the emergency room, temperature 100, blood pressure 84/65, respiratory 32.  94% on room air.  AKI.  Mildly elevated transaminases.  Chest x-ray normal.  Head CT normal.  COVID-19 positive.  EKG with rapid A. fib.  Lactic acid 5.4.   Assessment & Plan:   Principal Problem:   COVID-19 virus infection Active Problems:   PVD (peripheral vascular disease) (HCC)   Atrial fibrillation with RVR (HCC)   History of DVT (deep vein thrombosis)   AKI (acute kidney injury) (HCC)   Thrombocytopenia (HCC)  Sepsis present on admission with lactic acidosis, hypotension in the setting of COVID-19 viral infection, dehydration.  Primary source unknown. He has minimal pulmonary symptoms.  He is mostly on room air. chest physiotherapy, incentive spirometry, deep breathing exercises, sputum induction, mucolytic's and bronchodilators as much patient tolerates. Supplemental oxygen to keep saturations more than 90%. Covid directed therapy with , steroids dexamethasone daily remdesivir day 3/5 antibiotics, treating for sepsis of unknown origin.  Continue on Rocephin and azithromycin. Blood cultures positive for Staph epidermidis, probably contaminant. Due to severity of symptoms, patient will need daily inflammatory markers, chest x-rays, liver function test to monitor and direct COVID-19 therapies.  COVID-19 Labs  Recent Labs    10/01/19 0816  DDIMER  9.28*  FERRITIN 1,821*  CRP 4.3*    Lab Results  Component Value Date   SARSCOV2NAA POSITIVE (A) 09/29/2019   SARSCOV2NAA NEGATIVE 07/24/2019   SpO2: 96 %   Chronic A. fib with RVR: Initially treated with Cardizem drip.  Now started on amiodarone and oral Cardizem with good control of heart rate.  Not an anticoagulation candidate. Patient apparently only on metoprolol at home.  Will discontinue amiodarone and start metoprolol.  Discontinue Cardizem.  His heart rate is acceptable.  Acute metabolic encephalopathy: Unknown baseline.  Probably has baseline dementia.  No focal deficit.  Acute kidney injury on chronic kidney disease stage II: Continue to monitor on IV fluids.  Goal of care/advance care planning: Patient remains quite debilitated with advanced medical conditions.  He is also poor overall medical condition, disabled and with extreme debility.  Has recurrent hospitalizations and failure to thrive.  Now with confusion related to acute medical issues with underlying cognitive dysfunction.  Patient will benefit with palliative care discussion. I called and discussed with patient's daughter Ms. [name] on the phone with home patient lives.  We discussed that patient may benefit with hospice level of care at home. Will recommend hospice care at home after clinical stabilization. Palliative care team updated.   DVT prophylaxis: SCDs Start: 09/30/19 8119   Code Status: DNR Family Communication: Patient's daughter on the phone. Disposition Plan: Status is: Inpatient  Remains inpatient appropriate because:IV treatments appropriate due to intensity of illness or inability to take PO   Dispo: The patient is from: Home  Anticipated d/c is to: Home with home health versus home hospice.              Anticipated d/c date is: 1 to 2 days.              Patient currently is not medically stable to d/c.         Consultants:   None  Procedures:    None  Antimicrobials:  Antibiotics Given (last 72 hours)    Date/Time Action Medication Dose Rate   09/29/19 2045 New Bag/Given   azithromycin (ZITHROMAX) 500 mg in sodium chloride 0.9 % 250 mL IVPB 500 mg 250 mL/hr   09/29/19 2208 New Bag/Given   cefTRIAXone (ROCEPHIN) 2 g in sodium chloride 0.9 % 100 mL IVPB 2 g 200 mL/hr   09/30/19 0108 New Bag/Given   remdesivir 200 mg in sodium chloride 0.9% 250 mL IVPB 200 mg 580 mL/hr   09/30/19 1059 New Bag/Given   remdesivir 100 mg in sodium chloride 0.9 % 100 mL IVPB 100 mg 200 mL/hr   09/30/19 1919 New Bag/Given   cefTRIAXone (ROCEPHIN) 2 g in sodium chloride 0.9 % 100 mL IVPB 2 g 200 mL/hr   09/30/19 1929 New Bag/Given   azithromycin (ZITHROMAX) 500 mg in sodium chloride 0.9 % 250 mL IVPB 500 mg 250 mL/hr   10/01/19 1029 New Bag/Given   remdesivir 100 mg in sodium chloride 0.9 % 100 mL IVPB 100 mg 200 mL/hr         Subjective: Patient seen and examined.  Still in the ER.  Heart rate acceptable and less than 100.  Patient was slightly awake today, however he is confused and disoriented.  Nursing reported good oral intake and no issues with nausea or vomiting. Patient thinks he is 86 year old.  He thinks he lives with his mom and dad.  Objective: Vitals:   10/01/19 1000 10/01/19 1034 10/01/19 1100 10/01/19 1200  BP: 105/66 105/66 99/61 124/73  Pulse: (!) 107 95 79 93  Resp: (!) 25 18 (!) 24 (!) 23  Temp:      TempSrc:      SpO2: 97% 99% 94% 96%    Intake/Output Summary (Last 24 hours) at 10/01/2019 1509 Last data filed at 09/30/2019 2128 Gross per 24 hour  Intake 0 ml  Output --  Net 0 ml   There were no vitals filed for this visit.  Examination:  General exam: Appears calm and comfortable  Pleasantly confused and disoriented.  Not in any distress.  On room air. Respiratory system: Clear to auscultation. Respiratory effort normal.  Some conducted airway sounds. Cardiovascular system: S1 & S2 heard, irregularly  irregular.  No JVD, murmurs, rubs, gallops or clicks. No pedal edema. Left above-knee amputation stump clean and dry. Gastrointestinal system: Abdomen is nondistended, soft and nontender. No organomegaly or masses felt. Normal bowel sounds heard.     Data Reviewed: I have personally reviewed following labs and imaging studies  CBC: Recent Labs  Lab 09/29/19 1832 10/01/19 1033  WBC 9.3 6.2  HGB 15.2 12.1*  HCT 48.7 38.7*  MCV 86.8 86.2  PLT 79* 65*   Basic Metabolic Panel: Recent Labs  Lab 09/29/19 1832 09/30/19 0338 10/01/19 0816  NA 141 141 141  K 4.0 3.9 4.3  CL 107 109 110  CO2 19* 21* 22  GLUCOSE 164* 133* 132*  BUN 50* 43* 40*  CREATININE 1.78* 1.35* 1.19  CALCIUM 8.1* 7.4* 7.4*  MG  --  1.6* 2.1  PHOS  --  3.2  --    GFR: CrCl cannot be calculated (Unknown ideal weight.). Liver Function Tests: Recent Labs  Lab 09/29/19 1832 09/30/19 0338 10/01/19 0816  AST 128*  --  102*  ALT 51*  --  43  ALKPHOS 55  --  41  BILITOT 0.9  --  0.6  PROT 6.9  --  5.4*  ALBUMIN 2.5* 2.0* 2.0*   No results for input(s): LIPASE, AMYLASE in the last 168 hours. No results for input(s): AMMONIA in the last 168 hours. Coagulation Profile: Recent Labs  Lab 09/29/19 2026  INR 1.2   Cardiac Enzymes: No results for input(s): CKTOTAL, CKMB, CKMBINDEX, TROPONINI in the last 168 hours. BNP (last 3 results) No results for input(s): PROBNP in the last 8760 hours. HbA1C: No results for input(s): HGBA1C in the last 72 hours. CBG: Recent Labs  Lab 09/29/19 2304  GLUCAP 127*   Lipid Profile: No results for input(s): CHOL, HDL, LDLCALC, TRIG, CHOLHDL, LDLDIRECT in the last 72 hours. Thyroid Function Tests: No results for input(s): TSH, T4TOTAL, FREET4, T3FREE, THYROIDAB in the last 72 hours. Anemia Panel: Recent Labs    10/01/19 0816  FERRITIN 1,821*   Sepsis Labs: Recent Labs  Lab 09/29/19 1836 09/30/19 0338  LATICACIDVEN 5.4* 2.3*    Recent Results (from the  past 240 hour(s))  Blood Culture (routine x 2)     Status: Abnormal (Preliminary result)   Collection Time: 09/29/19  8:26 PM   Specimen: BLOOD  Result Value Ref Range Status   Specimen Description BLOOD RIGHT ANTECUBITAL  Final   Special Requests   Final    BOTTLES DRAWN AEROBIC AND ANAEROBIC Blood Culture adequate volume   Culture  Setup Time   Final    GRAM POSITIVE COCCI IN CLUSTERS ANAEROBIC BOTTLE ONLY Organism ID to follow CRITICAL RESULT CALLED TO, READ BACK BY AND VERIFIED WITH: Harland GermanNDREW MEYER PHARMD @1955  09/30/19 EB Performed at [facility], [address] [place], [place] [address]    Culture STAPHYLOCOCCUS EPIDERMIDIS (A)  Final   Report Status PENDING  Incomplete  SARS Coronavirus 2 by RT PCR (hospital order, performed in [facility] hospital lab) Nasopharyngeal Nasopharyngeal Swab     Status: Abnormal   Collection Time: 09/29/19  8:26 PM   Specimen: Nasopharyngeal Swab  Result Value Ref Range Status   SARS Coronavirus 2 POSITIVE (A) NEGATIVE Final    Comment: RESULT CALLED TO, READ BACK BY AND VERIFIED WITH: G KOPP RN 2231 09/29/19 A BROWNING (NOTE) SARS-CoV-2 target nucleic acids are DETECTED  SARS-CoV-2 RNA is generally detectable in upper respiratory specimens  during the acute phase of infection.  Positive results are indicative  of the presence of the identified virus, but do not rule out bacterial infection or co-infection with other pathogens not detected by the test.  Clinical correlation with patient history and  other diagnostic information is necessary to determine patient infection status.  The expected result is negative.  Fact Sheet for Patients:   BoilerBrush.com.cyhttps://www.fda.gov/media/136312/download   Fact Sheet for Healthcare Providers:   https://pope.com/https://www.fda.gov/media/136313/download    This test is not yet approved or cleared by the [place] FDA and  has been authorized for detection and/or diagnosis of SARS-CoV-2 by FDA under an Emergency Use  Authorization (EUA).  This EUA will remain in effect (meaning this tes t can be used) for the duration of  the COVID-19 declaration under Section 564(b)(1) of the Act, 21 U.S.C. section 360-bbb-3(b)(1), unless the authorization  is terminated or revoked sooner.  Performed at [facility], [address], [place], [place] [address]   Blood Culture ID Panel (Reflexed)     Status: Abnormal   Collection Time: 09/29/19  8:26 PM  Result Value Ref Range Status   Enterococcus faecalis NOT DETECTED NOT DETECTED Final   Enterococcus Faecium NOT DETECTED NOT DETECTED Final   Listeria monocytogenes NOT DETECTED NOT DETECTED Final   Staphylococcus species DETECTED (A) NOT DETECTED Final    Comment: RESULT CALLED TO, READ BACK BY AND VERIFIED WITH: ANDREW MEYER PHARMD @1955  09/30/19 EB    Staphylococcus aureus (BCID) NOT DETECTED NOT DETECTED Final   Staphylococcus epidermidis DETECTED (A) NOT DETECTED Final    Comment: Methicillin (oxacillin) resistant coagulase negative staphylococcus. Possible blood culture contaminant (unless isolated from more than one blood culture draw or clinical case suggests pathogenicity). No antibiotic treatment is indicated for blood  culture contaminants. RESULT CALLED TO, READ BACK BY AND VERIFIED WITH: 10/02/19 PHARMD @1955  09/30/19 EB    Staphylococcus lugdunensis NOT DETECTED NOT DETECTED Final   Streptococcus species NOT DETECTED NOT DETECTED Final   Streptococcus agalactiae NOT DETECTED NOT DETECTED Final   Streptococcus pneumoniae NOT DETECTED NOT DETECTED Final   Streptococcus pyogenes NOT DETECTED NOT DETECTED Final   A.calcoaceticus-baumannii NOT DETECTED NOT DETECTED Final   Bacteroides fragilis NOT DETECTED NOT DETECTED Final   Enterobacterales NOT DETECTED NOT DETECTED Final   Enterobacter cloacae complex NOT DETECTED NOT DETECTED Final   Escherichia coli NOT DETECTED NOT DETECTED Final   Klebsiella aerogenes NOT DETECTED NOT DETECTED Final    Klebsiella oxytoca NOT DETECTED NOT DETECTED Final   Klebsiella pneumoniae NOT DETECTED NOT DETECTED Final   Proteus species NOT DETECTED NOT DETECTED Final   Salmonella species NOT DETECTED NOT DETECTED Final   Serratia marcescens NOT DETECTED NOT DETECTED Final   Haemophilus influenzae NOT DETECTED NOT DETECTED Final   Neisseria meningitidis NOT DETECTED NOT DETECTED Final   Pseudomonas aeruginosa NOT DETECTED NOT DETECTED Final   Stenotrophomonas maltophilia NOT DETECTED NOT DETECTED Final   Candida albicans NOT DETECTED NOT DETECTED Final   Candida auris NOT DETECTED NOT DETECTED Final   Candida glabrata NOT DETECTED NOT DETECTED Final   Candida krusei NOT DETECTED NOT DETECTED Final   Candida parapsilosis NOT DETECTED NOT DETECTED Final   Candida tropicalis NOT DETECTED NOT DETECTED Final   Cryptococcus neoformans/gattii NOT DETECTED NOT DETECTED Final   Methicillin resistance mecA/C DETECTED (A) NOT DETECTED Final    Comment: RESULT CALLED TO, READ BACK BY AND VERIFIED WITH: PHARMD @1955  09/30/19 EB Performed at [facility], [address], [place], [facility] [address]   Blood Culture (routine x 2)     Status: None (Preliminary result)   Collection Time: 09/29/19  8:50 PM   Specimen: BLOOD RIGHT HAND  Result Value Ref Range Status   Specimen Description BLOOD RIGHT HAND  Final   Special Requests   Final    BOTTLES DRAWN AEROBIC ONLY Blood Culture results may not be optimal due to an inadequate volume of blood received in culture bottles   Culture   Final    NO GROWTH 2 DAYS Performed at [facility], [address] [place], [facility] [address]    Report Status PENDING  Incomplete         Radiology Studies: CT Head Wo Contrast  Result Date: 09/29/2019 CLINICAL DATA:  Atrial fibrillation with rapid ventricular response, confusion EXAM: CT  HEAD WITHOUT CONTRAST TECHNIQUE: Contiguous axial images were obtained from the base of the skull through the  vertex without intravenous contrast. COMPARISON:  07/25/2019 FINDINGS: Brain: Stable chronic small vessel ischemic changes are seen throughout the periventricular white matter and basal ganglia. Stable encephalomalacia a left cerebellar hemisphere. No acute infarct or hemorrhage. Lateral ventricles and midline structures are stable. No acute extra-axial fluid collections. No mass effect. Vascular: No hyperdense vessel or unexpected calcification. Skull: Postsurgical changes from left occipital craniotomy. No acute bony abnormalities. Sinuses/Orbits: No acute finding. Other: None. IMPRESSION: 1. Stable chronic ischemic changes.  No acute intracranial process. Electronically Signed   By: Sharlet Salina M.D.   On: 09/29/2019 21:43   DG Chest Port 1 View  Result Date: 09/29/2019 CLINICAL DATA:  86 year old male with concern for sepsis. EXAM: PORTABLE CHEST 1 VIEW COMPARISON:  Chest radiograph dated 07/24/2019. FINDINGS: Faint left lung base hazy densities, similar to prior radiograph and likely atelectasis. No focal consolidation, pleural effusion, pneumothorax. The cardiac silhouette is within limits. Atherosclerotic calcification of the aorta. No acute osseous pathology. IMPRESSION: No active disease. Electronically Signed   By: Elgie Collard M.D.   On: 09/29/2019 20:17        Scheduled Meds: . albuterol  2 puff Inhalation Q6H  . vitamin C  500 mg Oral Daily  . dexamethasone (DECADRON) injection  6 mg Intravenous Q24H  . metoprolol tartrate  25 mg Oral BID  . zinc sulfate  220 mg Oral Daily   Continuous Infusions: . sodium chloride 125 mL/hr at 09/30/19 1916  . azithromycin Stopped (09/30/19 2128)  . cefTRIAXone (ROCEPHIN)  IV Stopped (09/30/19 2010)  . remdesivir 100 mg in NS 100 mL Stopped (10/01/19 1133)     LOS: 2 days    Time spent: 30 minutes    Dorcas Carrow, MD [facility] Pager [phone]

## 2019-10-01 NOTE — Consult Note (Signed)
Consultation Note Date: 10/01/2019   Patient Name: [name] [name]  DOB: [birth year]  MRN: [id number]  Age / Sex: 86 y.o., male  PCP: Maury Dus, MD Referring Physician: Barb Merino, MD  Reason for Consultation: Establishing goals of care  HPI/Patient Profile: 86 y.o. male  with past medical history of stroke, PAD, DVT, atrial fibrillation, and cerebral aneurism admitted on 09/29/2019 with COVID19 infection, atrial fibrillation with RVR, AMS, and AKI.   Patient and family face treatment option decisions, advanced directive decisions, and anticipatory care needs.   Clinical Assessment and Goals of Care: I have reviewed medical records including EPIC notes, labs, and imaging. Received report from primary RN - no acute concerns. RN is unsure if patient has been eating, but does report that he takes his medications and is drinking sips of water,   Met with daughter [name] via phone to discuss diagnosis, prognosis, GOC, EOL wishes, disposition, and options.  I introduced Palliative Medicine as specialized medical care for people living with serious illness. It focuses on providing relief from the symptoms and stress of a serious illness. The goal is to improve quality of life for both the patient and the family.  Prior to hospitalization, the patient was living with [name]. She states that the patient was very self sufficient at home. Utilizing his wheelchair, he was able to get around the house and get food for himself. As far as functional and nutritional status, he was eating and drinking well until about a month ago. It was at this time [name] noticed a decline in his physical and mental status. His PO intake decreased and she noticed he became increasingly weak. He experienced periodic confusion. Around Sunday the patient was grabbing at things that weren't there and on Tuesday night had an episode of  unresponsiveness, which lead the daughter to call 911.   We discussed patient's current illness and what it means in the larger context of patient's on-going co-morbidities. [name] did not have a clear understanding of the patient's current medical situation - reviewed in detail and answered all questions. I attempted to elicit values and goals of care important to the patient. The difference between aggressive medical intervention and comfort care was considered in light of the patient's goals of care. Natural disease trajectory and expectations at EOL were discussed. [name] states that she is interested in the patient going to rehab when discharged. Explained that with the patient's current confusion and clinical decline, rehab may not be beneficial for him. Hospice services were explained and the daughter is open to these services. [name] is familiar with hospice at home as they were utilized with the patient's wife.   [name] has sold her house today and is expected to close on a house in [place] next Monday. She is open to hospice support but is currently in the middle of a large transition on where she is living and is uncertain about what that will look like. She wants to continue to treat the treatable for the patient for now with watchful  waiting. The patient is already in connection with [facility] outpatient Palliative services. They are aware of the current situation and have been in contact with social work to help navigate/find resources in [place] for the patient and family.  [name] wanted to visit the patient - explained the current [id number] visitation policy for patients testing positive for COVID - she was understanding.  Discussed with family the importance of continued conversation with the medical providers regarding overall plan of care and treatment options, ensuring decisions are within the context of the patient's values and GOCs.    Questions and concerns were addressed.  The family was encouraged to call with questions or concerns.    NEXT OF KIN Marlowe Alt - daughter  SUMMARY OF RECOMMENDATIONS   -Continue current full scope medical treatment -Watchful waiting -Continue DNR/DNI as previously documented -Daughter is hopeful for rehab at discharge but is open to hospice if patient does not make a significant clinical recovery. She understands that rehab will most likely not be beneficial for the patient.  -Daughter sold her house today and is closing on a house next Monday in [place]. She is currently trying to figure out if/which hospice will be an option depending on when patient is discharged. LCSW is aware of situation. -Patient is already being followed by [facility]; they are aware of admission and discussing with LCSW transition to [place] -PMT will continue to follow holistically  Code Status/Advance Care Planning:  DNR  Palliative Prophylaxis:   Aspiration, Bowel Regimen, Delirium Protocol, Frequent Pain Assessment, Oral Care and Turn Reposition  Additional Recommendations (Limitations, Scope, Preferences):  Full Scope Treatment  Psycho-social/Spiritual:   Desire for further Chaplaincy support:no  Created space and opportunity for family to express thoughts and feelings regarding patient's current medical situation.   Emotional support provided.  Prognosis:   < 2 weeks  Discharge Planning: To Be Determined      Primary Diagnoses: Present on Admission: . Atrial fibrillation with RVR ([facility]) . PVD (peripheral vascular disease) ([facility]) . AKI (acute kidney injury) ([facility]) . COVID-19 virus infection . Thrombocytopenia ([facility] [place])   I have reviewed the medical record, interviewed the patient and family, and examined the patient. The following aspects are pertinent.  Past Medical History:  Diagnosis Date  . A-fib ([facility])   . Cerebral aneurysm    history of cerebral hemorrhage due to brain aneurysm rupture 25 yrs ago   . Complication of anesthesia    had Atrial Fib.- per patient  . DJD (degenerative joint disease)   . DVT (deep venous thrombosis) ([facility])    history of prior to amputation  . Dysrhythmia    PAF  . History of kidney stones    passed all except 1.  . HOH (hard of hearing)   . Peripheral arterial disease ([facility])   . Stroke [facility])    patient unaware  . Urethral stricture    history of dilated   Social History   Socioeconomic History  . Marital status: Widowed    Spouse name: Not on file  . Number of children: 2  . Years of education: Not on file  . Highest education level: Not on file  Occupational History  . Occupation: retired    Fish farm manager: RETIRED  Tobacco Use  . Smoking status: Never Smoker  . Smokeless tobacco: Never Used  Vaping Use  . Vaping Use: Never used  Substance and Sexual Activity  . Alcohol use: No  . Drug use: No  . Sexual  activity: Not Currently  Other Topics Concern  . 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 Difficulty of Paying Living Expenses: Not on file  Food Insecurity:   . Worried About Charity fundraiser in the Last Year: Not on file  . Ran Out of Food in the Last Year: Not on file  Transportation Needs:   . Lack of Transportation (Medical): Not on file  . Lack of Transportation (Non-Medical): Not on file  Physical Activity:   . Days of Exercise per Week: Not on file  . Minutes of Exercise per Session: Not on file  Stress:   . Feeling of Stress : Not on file  Social Connections:   . Frequency of Communication with Friends and Family: Not on file  . Frequency of Social Gatherings with Friends and Family: Not on file  . Attends Religious Services: Not on file  . Active Member of Clubs or Organizations: Not on file  . Attends Theatre manager Meetings: Not on file  . Marital Status: Not on file   Family History  Problem Relation Age of Onset  . Heart attack Father   . Breast cancer Daughter   . Breast cancer Mother   . Hypertension Other    Scheduled Meds: . albuterol  2 puff Inhalation Q6H  . vitamin C  500 mg Oral Daily  . dexamethasone (DECADRON) injection  6 mg Intravenous Q24H  . metoprolol tartrate  25 mg Oral BID  . zinc sulfate  220 mg Oral Daily   Continuous Infusions: . sodium chloride 125 mL/hr at 09/30/19 1916  . azithromycin Stopped (09/30/19 2128)  . cefTRIAXone (ROCEPHIN)  IV Stopped (09/30/19 2010)  . remdesivir 100 mg in NS 100 mL Stopped (10/01/19 1133)   PRN Meds:.acetaminophen, chlorpheniramine-HYDROcodone, guaiFENesin-dextromethorphan, ondansetron **OR** ondansetron (ZOFRAN) IV Medications Prior to Admission:  Prior to Admission medications   Medication Sig Start Date End Date Taking? Authorizing Provider  aspirin 81 MG chewable tablet Chew 1 tablet (81 mg total) by mouth daily. 07/29/19  Yes Thurnell Lose, MD  levothyroxine (SYNTHROID) 25 MCG tablet Take 25 mcg by mouth daily. 09/11/19  Yes [provider]  metoprolol tartrate (LOPRESSOR) 25 MG tablet Take 25 mg by mouth 2 (two) times daily.   Yes [provider]  Multiple Vitamin (MULTIVITAMIN WITH MINERALS) TABS tablet Take 1 tablet by mouth every evening. [facility]   Yes [provider]  pantoprazole (PROTONIX) 40 MG tablet Take 1 tablet (40 mg total) by mouth daily. 07/29/19  Yes Thurnell Lose, MD  VOLTAREN 1 % GEL Apply 1 application topically 2 (two) times daily as needed (pain). Apply sparingly.  07/24/19  Yes [provider]  amiodarone (PACERONE) 100 MG tablet Take 1 tablet (100 mg total) by mouth daily. Patient not taking: Reported on 09/30/2019 08/11/19   Geralynn Rile, MD   Allergies  Allergen Reactions  . Coumadin [Warfarin Sodium] Other (See Comments)    Caused  bleeding in brain   Review of Systems  Unable to perform ROS: Mental status change     Vital Signs: BP 94/66   Pulse 88   Temp 97.6 F (36.4 C) (Oral)   Resp (!) 22   SpO2 94%  Pain Scale: Faces  Pain Score: 0-No pain   SpO2: SpO2: 94 % O2 Device:SpO2: 94 % O2 Flow Rate: .   IO: Intake/output summary:   Intake/Output Summary (Last 24 hours) at 10/01/2019 1558 Last data filed at 09/30/2019 2128 Gross per 24 hour  Intake 0 ml  Output --  Net 0 ml    LBM:   Baseline Weight:   Most recent weight:       Palliative Assessment/Data: PPS 20-30%     Time In: 1400 Time Out: 1510 Time Total: 70 minutes Greater than 50%  of this time was spent counseling and coordinating care related to the above assessment and plan.  Signed by: Lin Landsman, NP   Please contact Palliative Medicine Team phone at [phone] for questions and concerns.  For individual provider: See Shea Evans

## 2019-10-02 DIAGNOSIS — Z7189 Other specified counseling: Secondary | ICD-10-CM

## 2019-10-02 DIAGNOSIS — R6521 Severe sepsis with septic shock: Secondary | ICD-10-CM

## 2019-10-02 DIAGNOSIS — D696 Thrombocytopenia, unspecified: Secondary | ICD-10-CM

## 2019-10-02 DIAGNOSIS — R531 Weakness: Secondary | ICD-10-CM

## 2019-10-02 DIAGNOSIS — A419 Sepsis, unspecified organism: Secondary | ICD-10-CM

## 2019-10-02 LAB — CULTURE, BLOOD (ROUTINE X 2): Special Requests: ADEQUATE

## 2019-10-02 MED ORDER — METOPROLOL TARTRATE 50 MG PO TABS
50.0000 mg | ORAL_TABLET | Freq: Two times a day (BID) | ORAL | Status: DC
  Administered 2019-10-02 – 2019-10-04 (×5): 50 mg via ORAL
  Filled 2019-10-02 (×6): qty 1

## 2019-10-02 NOTE — Progress Notes (Signed)
PROGRESS NOTE    [name] [name]  DVV:[id number] DOB: [birth year] DOA: 09/29/2019 PCP: Elias Else, MD    Brief Narrative:  86 year old male with history of paroxysmal A. fib on metoprolol, not on anticoagulation because of previous severe bleeding, history of DVT presented to the emergency room for on and off confusion.  Lives at home with daughter.  He was acting off as per the family.  When EMS arrived at home, he was found with A. fib with RVR with heart rate 180.  They started patient on 20 mg of bolus Cardizem and IV fluids and brought to the ER.  In the emergency room, temperature 100, blood pressure 84/65, respiratory 32.  94% on room air.  AKI.  Mildly elevated transaminases.  Chest x-ray normal.  Head CT normal.  COVID-19 positive.  EKG with rapid A. fib.  Lactic acid 5.4.   Assessment & Plan:   Principal Problem:   COVID-19 virus infection Active Problems:   PVD (peripheral vascular disease) (HCC)   Atrial fibrillation with RVR (HCC)   History of DVT (deep vein thrombosis)   AKI (acute kidney injury) (HCC)   Thrombocytopenia (HCC)   Atrial fibrillation with rapid ventricular response (HCC)   Palliative care by specialist   Goals of care, counseling/discussion   DNR (do not resuscitate)   DNI (do not intubate)   Sepsis with acute renal failure and septic shock (HCC)   Weakness   Encounter for hospice care discussion  Sepsis present on admission with lactic acidosis, hypotension in the setting of COVID-19 viral infection, severe dehydration.   Patient is mostly on room air.  No pulmonary symptoms. chest physiotherapy, incentive spirometry, deep breathing exercises, sputum induction, mucolytic's and bronchodilators as much patient tolerates. Supplemental oxygen to keep saturations more than 90%. Covid directed therapy with , steroids dexamethasone daily remdesivir day 4/5 antibiotics, treating for sepsis of unknown origin.  Unknown if he has any infection, will dc  antibiotics and monitor. Blood cultures positive for Staph epidermidis, probably contaminant.  COVID-19 Labs  Recent Labs    10/01/19 0816  DDIMER 9.28*  FERRITIN 1,821*  CRP 4.3*    Lab Results  Component Value Date   SARSCOV2NAA POSITIVE (A) 09/29/2019   SARSCOV2NAA NEGATIVE 07/24/2019   SpO2: 99 %   Chronic A. fib with RVR: Initially treated with Cardizem drip.  Now started on amiodarone and oral Cardizem with good control of heart rate.  Not an anticoagulation candidate. Patient apparently only on metoprolol at home.  Will discontinue amiodarone and start metoprolol.  Discontinue Cardizem.  His heart rate is acceptable.  Acute metabolic encephalopathy: Unknown baseline.  Probably has baseline dementia.  No focal deficit.  Persistently confused.  Acute kidney injury on chronic kidney disease stage II: Continue to monitor on IV fluids.  Normalized.  Goal of care/advance care planning: Patient remains quite debilitated with advanced medical conditions.  Poor appetite and refusal of therapies.  Failure to thrive.  Followed by palliative care.  Family leaning towards taking him home with home hospice once his daughter moved to a new place.  Will recommend hospice care at home after clinical stabilization. Palliative care team updated. Called and discussed with patient's daughter.  She is in the process of moving to [place].  She is agreeable for home hospice program. Will need to find a local hospice program near to her new place where they can provide hospice care so patient can be discharged from the hospital.   DVT prophylaxis: SCDs Start: 09/30/19  1478   Code Status: DNR Family Communication: Patient's daughter on the phone. Disposition Plan: Status is: Inpatient  Remains inpatient appropriate because:IV treatments appropriate due to intensity of illness or inability to take PO   Dispo: The patient is from: Home              Anticipated d/c is to: Home with home  health versus home hospice.              Anticipated d/c date is: 1 to 2 days.              Patient currently is not medically stable to d/c.    Consultants:   Palliative care   Procedures:   None  Antimicrobials:  Antibiotics Given (last 72 hours)    Date/Time Action Medication Dose Rate   09/29/19 2045 New Bag/Given   azithromycin (ZITHROMAX) 500 mg in sodium chloride 0.9 % 250 mL IVPB 500 mg 250 mL/hr   09/29/19 2208 New Bag/Given   cefTRIAXone (ROCEPHIN) 2 g in sodium chloride 0.9 % 100 mL IVPB 2 g 200 mL/hr   09/30/19 0108 New Bag/Given   remdesivir 200 mg in sodium chloride 0.9% 250 mL IVPB 200 mg 580 mL/hr   09/30/19 1059 New Bag/Given   remdesivir 100 mg in sodium chloride 0.9 % 100 mL IVPB 100 mg 200 mL/hr   09/30/19 1919 New Bag/Given   cefTRIAXone (ROCEPHIN) 2 g in sodium chloride 0.9 % 100 mL IVPB 2 g 200 mL/hr   09/30/19 1929 New Bag/Given   azithromycin (ZITHROMAX) 500 mg in sodium chloride 0.9 % 250 mL IVPB 500 mg 250 mL/hr   10/01/19 1029 New Bag/Given   remdesivir 100 mg in sodium chloride 0.9 % 100 mL IVPB 100 mg 200 mL/hr   10/01/19 1933 New Bag/Given   cefTRIAXone (ROCEPHIN) 2 g in sodium chloride 0.9 % 100 mL IVPB 2 g 200 mL/hr   10/01/19 2234 New Bag/Given   azithromycin (ZITHROMAX) 500 mg in sodium chloride 0.9 % 250 mL IVPB 500 mg 250 mL/hr   10/02/19 0846 New Bag/Given   remdesivir 100 mg in sodium chloride 0.9 % 100 mL IVPB 100 mg 200 mL/hr         Subjective: Seen and examined.  No overnight events.  Refuses food.  Refuses to mobilize.  Still confused.  Objective: Vitals:   10/02/19 0049 10/02/19 0444 10/02/19 0538 10/02/19 0848  BP: 97/72 106/86  (!) 101/59  Pulse: 93 96  (!) 106  Resp:  18    Temp:  98.7 F (37.1 C)    TempSrc:  Oral    SpO2: 96% 97% 99%   Weight:      Height:        Intake/Output Summary (Last 24 hours) at 10/02/2019 1435 Last data filed at 10/02/2019 0446 Gross per 24 hour  Intake 1530 ml  Output --  Net  1530 ml   Filed Weights   10/01/19 1700  Weight: 69.1 kg    Examination:  General exam: Appears calm and comfortable , debilitated and chronically sick looking. confused and disoriented.  Not in any distress.  On room air. Respiratory system: Clear to auscultation. Respiratory effort normal.  Some conducted airway sounds. Cardiovascular system: S1 & S2 heard, irregularly irregular.  No JVD, murmurs, rubs, gallops or clicks. No pedal edema. Left above-knee amputation stump clean and dry. Gastrointestinal system: Abdomen is nondistended, soft and nontender. No organomegaly or masses felt. Normal bowel sounds heard.  Data Reviewed: I have personally reviewed following labs and imaging studies  CBC: Recent Labs  Lab 09/29/19 1832 10/01/19 1033  WBC 9.3 6.2  HGB 15.2 12.1*  HCT 48.7 38.7*  MCV 86.8 86.2  PLT 79* 65*   Basic Metabolic Panel: Recent Labs  Lab 09/29/19 1832 09/30/19 0338 10/01/19 0816  NA 141 141 141  K 4.0 3.9 4.3  CL 107 109 110  CO2 19* 21* 22  GLUCOSE 164* 133* 132*  BUN 50* 43* 40*  CREATININE 1.78* 1.35* 1.19  CALCIUM 8.1* 7.4* 7.4*  MG  --  1.6* 2.1  PHOS  --  3.2  --    GFR: Estimated Creatinine Clearance: 35.2 mL/min (by C-G formula based on SCr of 1.19 mg/dL). Liver Function Tests: Recent Labs  Lab 09/29/19 1832 09/30/19 0338 10/01/19 0816  AST 128*  --  102*  ALT 51*  --  43  ALKPHOS 55  --  41  BILITOT 0.9  --  0.6  PROT 6.9  --  5.4*  ALBUMIN 2.5* 2.0* 2.0*   No results for input(s): LIPASE, AMYLASE in the last 168 hours. No results for input(s): AMMONIA in the last 168 hours. Coagulation Profile: Recent Labs  Lab 09/29/19 2026  INR 1.2   Cardiac Enzymes: No results for input(s): CKTOTAL, CKMB, CKMBINDEX, TROPONINI in the last 168 hours. BNP (last 3 results) No results for input(s): PROBNP in the last 8760 hours. HbA1C: No results for input(s): HGBA1C in the last 72 hours. CBG: Recent Labs  Lab 09/29/19 2304   GLUCAP 127*   Lipid Profile: No results for input(s): CHOL, HDL, LDLCALC, TRIG, CHOLHDL, LDLDIRECT in the last 72 hours. Thyroid Function Tests: No results for input(s): TSH, T4TOTAL, FREET4, T3FREE, THYROIDAB in the last 72 hours. Anemia Panel: Recent Labs    10/01/19 0816  FERRITIN 1,821*   Sepsis Labs: Recent Labs  Lab 09/29/19 1836 09/30/19 0338  LATICACIDVEN 5.4* 2.3*    Recent Results (from the past 240 hour(s))  Blood Culture (routine x 2)     Status: Abnormal   Collection Time: 09/29/19  8:26 PM   Specimen: BLOOD  Result Value Ref Range Status   Specimen Description BLOOD RIGHT ANTECUBITAL  Final   Special Requests   Final    BOTTLES DRAWN AEROBIC AND ANAEROBIC Blood Culture adequate volume   Culture  Setup Time   Final    GRAM POSITIVE COCCI IN CLUSTERS ANAEROBIC BOTTLE ONLY Organism ID to follow CRITICAL RESULT CALLED TO, READ BACK BY AND VERIFIED WITH: ANDREW MEYER PHARMD @1955  09/30/19 EB AEROBIC BOTTLE ONLY GRAM POSITIVE COCCI GRAM POSITIVE RODS CRITICAL RESULT CALLED TO, READ BACK BY AND VERIFIED WITH: J [place] PHARMD 10/02/19 0050 JDW    Culture (A)  Final    STAPHYLOCOCCUS EPIDERMIDIS THE SIGNIFICANCE OF ISOLATING THIS ORGANISM FROM A SINGLE SET OF BLOOD CULTURES WHEN MULTIPLE SETS ARE DRAWN IS UNCERTAIN. PLEASE NOTIFY THE MICROBIOLOGY DEPARTMENT WITHIN ONE WEEK IF SPECIATION AND SENSITIVITIES ARE REQUIRED. Performed at [facility], [address] [place], [place] [address]    Report Status 10/02/2019 FINAL  Final  SARS Coronavirus 2 by RT PCR (hospital order, performed in [facility]) Nasopharyngeal Nasopharyngeal Swab     Status: Abnormal   Collection Time: 09/29/19  8:26 PM   Specimen: Nasopharyngeal Swab  Result Value Ref Range Status   SARS Coronavirus 2 POSITIVE (A) NEGATIVE Final    Comment: RESULT CALLED TO, READ BACK BY AND VERIFIED WITH: G  KOPP RN 2231 09/29/19 A BROWNING (NOTE) SARS-CoV-2 target nucleic acids are  DETECTED  SARS-CoV-2 RNA is generally detectable in upper respiratory specimens  during the acute phase of infection.  Positive results are indicative  of the presence of the identified virus, but do not rule out bacterial infection or co-infection with other pathogens not detected by the test.  Clinical correlation with patient history and  other diagnostic information is necessary to determine patient infection status.  The expected result is negative.  Fact Sheet for Patients:   BoilerBrush.com.cy   Fact Sheet for Healthcare Providers:   https://pope.com/    This test is not yet approved or cleared by the [place] FDA and  has been authorized for detection and/or diagnosis of SARS-CoV-2 by FDA under an Emergency Use Authorization (EUA).  This EUA will remain in effect (meaning this tes t can be used) for the duration of  the COVID-19 declaration under Section 564(b)(1) of the Act, 21 U.S.C. section 360-bbb-3(b)(1), unless the authorization is terminated or revoked sooner.  Performed at [facility], [address] [place], [place] [address]   Blood Culture ID Panel (Reflexed)     Status: Abnormal   Collection Time: 09/29/19  8:26 PM  Result Value Ref Range Status   Enterococcus faecalis NOT DETECTED NOT DETECTED Final   Enterococcus Faecium NOT DETECTED NOT DETECTED Final   Listeria monocytogenes NOT DETECTED NOT DETECTED Final   Staphylococcus species DETECTED (A) NOT DETECTED Final    Comment: RESULT CALLED TO, READ BACK BY AND VERIFIED WITH: ANDREW MEYER PHARMD @1955  09/30/19 EB    Staphylococcus aureus (BCID) NOT DETECTED NOT DETECTED Final   Staphylococcus epidermidis DETECTED (A) NOT DETECTED Final    Comment: Methicillin (oxacillin) resistant coagulase negative staphylococcus. Possible blood culture contaminant (unless isolated from more than one blood culture draw or clinical case suggests pathogenicity). No  antibiotic treatment is indicated for blood  culture contaminants. RESULT CALLED TO, READ BACK BY AND VERIFIED WITH: 10/02/19 PHARMD @1955  09/30/19 EB    Staphylococcus lugdunensis NOT DETECTED NOT DETECTED Final   Streptococcus species NOT DETECTED NOT DETECTED Final   Streptococcus agalactiae NOT DETECTED NOT DETECTED Final   Streptococcus pneumoniae NOT DETECTED NOT DETECTED Final   Streptococcus pyogenes NOT DETECTED NOT DETECTED Final   A.calcoaceticus-baumannii NOT DETECTED NOT DETECTED Final   Bacteroides fragilis NOT DETECTED NOT DETECTED Final   Enterobacterales NOT DETECTED NOT DETECTED Final   Enterobacter cloacae complex NOT DETECTED NOT DETECTED Final   Escherichia coli NOT DETECTED NOT DETECTED Final   Klebsiella aerogenes NOT DETECTED NOT DETECTED Final   Klebsiella oxytoca NOT DETECTED NOT DETECTED Final   Klebsiella pneumoniae NOT DETECTED NOT DETECTED Final   Proteus species NOT DETECTED NOT DETECTED Final   Salmonella species NOT DETECTED NOT DETECTED Final   Serratia marcescens NOT DETECTED NOT DETECTED Final   Haemophilus influenzae NOT DETECTED NOT DETECTED Final   Neisseria meningitidis NOT DETECTED NOT DETECTED Final   Pseudomonas aeruginosa NOT DETECTED NOT DETECTED Final   Stenotrophomonas maltophilia NOT DETECTED NOT DETECTED Final   Candida albicans NOT DETECTED NOT DETECTED Final   Candida auris NOT DETECTED NOT DETECTED Final   Candida glabrata NOT DETECTED NOT DETECTED Final   Candida krusei NOT DETECTED NOT DETECTED Final   Candida parapsilosis NOT DETECTED NOT DETECTED Final   Candida tropicalis NOT DETECTED NOT DETECTED Final   Cryptococcus neoformans/gattii NOT DETECTED NOT DETECTED Final   Methicillin resistance mecA/C DETECTED (A) NOT DETECTED  Final    Comment: RESULT CALLED TO, READ BACK BY AND VERIFIED WITH: Harland German PHARMD @1955  09/30/19 EB Performed at [facility], [address], [place], [place]   Blood Culture  (routine x 2)     Status: None (Preliminary result)   Collection Time: 09/29/19  8:50 PM   Specimen: BLOOD RIGHT HAND  Result Value Ref Range Status   Specimen Description BLOOD RIGHT HAND  Final   Special Requests   Final    BOTTLES DRAWN AEROBIC ONLY Blood Culture results may not be optimal due to an inadequate volume of blood received in culture bottles   Culture   Final    NO GROWTH 3 DAYS Performed at [facility], [address] [place], [place]    Report Status PENDING  Incomplete         Radiology Studies: No results found.      Scheduled Meds: . albuterol  2 puff Inhalation Q6H  . vitamin C  500 mg Oral Daily  . dexamethasone (DECADRON) injection  6 mg Intravenous Q24H  . metoprolol tartrate  25 mg Oral BID  . zinc sulfate  220 mg Oral Daily   Continuous Infusions: . sodium chloride 125 mL/hr at 09/30/19 1916  . remdesivir 100 mg in NS 100 mL 100 mg (10/02/19 0846)     LOS: 3 days    Time spent: 30 minutes    10/04/19, MD [facility] Pager [phone]

## 2019-10-02 NOTE — TOC Initial Note (Signed)
Transition of Care [facility]) - Initial/Assessment Note    Patient Details  Name: [name] [name] MRN: [id number] Date of Birth: [birth year]  Transition of [facility] CM/SW Contact:    Mearl Latin, LCSW Phone Number: 10/02/2019, 3:37 PM  Clinical Narrative:                 CSW received consult for possible SNF placement/home with hospice in [place] at time of discharge. CSW spoke with patient's daughter regarding PT recommendation of SNF placement at time of discharge. Patient's daughter reported that she is closing on her new house in [place], [place] on Monday and is unable to care for patient at their home given patient's current physical needs and fall risk. CSW discussed insurance authorization process and that insurance may deny patient if he is unable to demonstrate ability to work with therapy. If patient is denied, options are SNF out of pocket or home with hospice in [place]. CSW provided Medicare SNF ratings list for [name] (only available COVID SNF). Patient's daughter expressed being hopeful for rehab and for patient to feel better soon. CSW contacted hospice in [place] that would serve [place]: [facility] [place] (f. [phone]) and [facility] c. [phone], f. [phone]). Quotes for non-emergency ambulance: -[facility] [phone]), -[facility] ($5,188). If able to sit in a wheelchair, daughter or granddaughter willing to ride in a wheelchair [facility]) if it is cheaper. Will send referral to [facility] for review. No further questions reported at this time.      Barriers to Discharge: Continued Medical Work up, [employer], English as a second language teacher, Transportation, SNF Pending bed offer   Patient Goals and CMS Choice Patient states their goals for this hospitalization and ongoing recovery are:: Rehab CMS Medicare.gov Compare Post Acute Care list provided to:: Patient Represenative (must comment) (Daughter) Choice offered to / list presented to : Adult  Children  Expected Discharge Plan and Services   In-house Referral: Clinical Social Work, Hospice / Palliative Care   Post Acute Care Choice: Skilled Nursing Facility Living arrangements for the past 2 months: Single Family Home                                      Prior Living Arrangements/Services Living arrangements for the past 2 months: Single Family Home Lives with:: Adult Children Patient language and need for interpreter reviewed:: Yes Do you feel safe going back to the place where you live?: Yes      Need for Family Participation in Patient Care: Yes (Comment) Care giver support system in place?: Yes (comment) Current home services: DME Criminal Activity/Legal Involvement Pertinent to Current Situation/Hospitalization: No - Comment as needed  Activities of Daily Living   ADL Screening (condition at time of admission) Patient's cognitive ability adequate to safely complete daily activities?: No Is the patient deaf or have difficulty hearing?: Yes (HOH) Does the patient have difficulty seeing, even when wearing glasses/contacts?: No Does the patient have difficulty concentrating, remembering, or making decisions?: Yes Patient able to express need for assistance with ADLs?: No Does the patient have difficulty dressing or bathing?: Yes Independently performs ADLs?: No Communication: Needs assistance Does the patient have difficulty walking or climbing stairs?: Yes Weakness of Legs: Right Weakness of Arms/Hands: Both  Permission Sought/Granted Permission sought to share information with : Facility Medical sales representative Permission granted to share information with : No  Share Information with NAME: [name]  Permission granted to share  info w AGENCY: SNFs/Hospice  Permission granted to share info w Relationship: Daughter  Permission granted to share info w Contact Information: [phone]  Emotional Assessment   Attitude/Demeanor/Rapport: Unable to  Assess Affect (typically observed): Unable to Assess Orientation: : Oriented to Self Alcohol / Substance Use: Not Applicable Psych Involvement: No (comment)  Admission diagnosis:  Atrial fibrillation with rapid ventricular response (HCC) [I48.91] Sepsis with acute renal failure and septic shock, due to unspecified organism, unspecified acute renal failure type (HCC) [A41.9, R65.21, N17.9] COVID-19 virus infection [U07.1] Patient Active Problem List   Diagnosis Date Noted  . Sepsis with acute renal failure and septic shock (HCC)   . Weakness   . Encounter for hospice care discussion   . Atrial fibrillation with rapid ventricular response (HCC)   . Palliative care by specialist   . Goals of care, counseling/discussion   . DNR (do not resuscitate)   . DNI (do not intubate)   . AKI (acute kidney injury) (HCC) 09/29/2019  . COVID-19 virus infection 09/29/2019  . Thrombocytopenia (HCC) 09/29/2019  . Atrial fibrillation with RVR (HCC) 07/24/2019  . History of cerebellar hemorrhage 07/24/2019  . History of DVT (deep vein thrombosis) 07/24/2019  . Pain due to onychomycosis of toenails of both feet 12/26/2018  . PVD (peripheral vascular disease) (HCC) 12/26/2018  . Coagulation defect (HCC) 12/26/2018  . CVA (cerebral vascular accident) (HCC) 06/08/2014  . TIA (transient ischemic attack) 05/30/2014  . Wound infection 06/22/2013  . Cerebellar hemorrhage, acute (HCC) 05/14/2013  . Peripheral arterial disease (HCC)   . DVT (deep venous thrombosis) (HCC)   . Atrial fibrillation (HCC) 06/19/2010  . Long term (current) use of anticoagulants 06/19/2010   PCP:  Elias Else, MD Pharmacy:   [facility] [address], [place] [address] [place] [address] Phone: [phone] Fax: [phone]  [facility] [address] [place] - [address] [place] [address] Phone: [phone] Fax: [phone]     Social  Determinants of Health (SDOH) Interventions    Readmission Risk Interventions No flowsheet data found.

## 2019-10-02 NOTE — Evaluation (Signed)
Physical Therapy Evaluation Patient Details Name: [name] [name] MRN: [id number] DOB: 07-07-1926 Today's Date: 10/02/2019   History of Present Illness  86 yo male presenting with ED Afib RVR and confusion. Testing COVID positive. PMH including LT AKA,  paroxysmal A. fib, cerebral aneurysm, DVT, and nephrolithiasis.  Clinical Impression  Pt presents to PT with confusion, weakness. Today resisting all mobility. If pt improves and is able to participate in therapy then SNF will be appropriate. If pt unable to participate more likely will need hospice.     Follow Up Recommendations SNF (if pt can participate. If not pt likely will need hospice)    Equipment Recommendations  None recommended by PT    Recommendations for Other Services       Precautions / Restrictions        Mobility  Bed Mobility Overal bed mobility: Needs Assistance Bed Mobility: Rolling;Supine to Sit;Sit to Sidelying Rolling: +2 for physical assistance;Total assist   Supine to sit: +2 for physical assistance;Total assist   Sit to sidelying: +2 for physical assistance;Total assist General bed mobility comments: Assist for all aspects with patient resisting movement  Transfers                 General transfer comment: Unable to attempt  Ambulation/Gait             General Gait Details: Pt non ambulatory  Stairs            Wheelchair Mobility    Modified Rankin (Stroke Patients Only)       Balance Overall balance assessment: Needs assistance Sitting-balance support: Bilateral upper extremity supported;Feet supported Sitting balance-Leahy Scale: Zero Sitting balance - Comments: total assist to sit EOB due to pt attempting to lie back down. Only able to hold up on EOB x 60 sec Postural control: Posterior lean;Left lateral lean                                   Pertinent Vitals/Pain Pain Assessment: Faces Faces Pain Scale: No hurt    Home Living Family/patient expects  to be discharged to:: Private residence Living Arrangements: Children Available Help at Discharge: Available PRN/intermittently           Home Equipment: Dan Humphreys - 2 wheels;Cane - single point;Bedside commode;Shower seat;Wheelchair - manual Additional Comments: Daughter in the process of moving to [place].    Prior Function Level of Independence: Needs assistance   Gait / Transfers Assistance Needed: Non ambulatory. Wheelchair transfers with supervision. Modified independent in wheelchair     Comments: Prior level for recent admission in June 2021     Hand Dominance   Dominant Hand: Right    Extremity/Trunk Assessment   Upper Extremity Assessment Upper Extremity Assessment: Defer to OT evaluation    Lower Extremity Assessment Lower Extremity Assessment: Generalized weakness;LLE deficits/detail LLE Deficits / Details: AKA       Communication   Communication: Expressive difficulties (mumble unintelligibly)  Cognition Arousal/Alertness: Awake/alert Behavior During Therapy: Flat affect Overall Cognitive Status: Difficult to assess                                 General Comments: Pt only following a few 1 step commands. Mumbles but unable to understand pt.       General Comments      Exercises     Assessment/Plan  PT Assessment Patient needs continued PT services  PT Problem List Decreased strength;Decreased balance;Decreased mobility;Decreased cognition       PT Treatment Interventions DME instruction;Functional mobility training;Therapeutic activities;Therapeutic exercise;Balance training;Patient/family education    PT Goals (Current goals can be found in the Care Plan section)  Acute Rehab PT Goals Patient Stated Goal: Pt unable PT Goal Formulation: Patient unable to participate in goal setting Time For Goal Achievement: 10/16/19 Potential to Achieve Goals: Fair    Frequency Min 2X/week   Barriers to discharge         Co-evaluation               AM-PAC PT "6 Clicks" Mobility  Outcome Measure Help needed turning from your back to your side while in a flat bed without using bedrails?: Total Help needed moving from lying on your back to sitting on the side of a flat bed without using bedrails?: Total Help needed moving to and from a bed to a chair (including a wheelchair)?: Total Help needed standing up from a chair using your arms (e.g., wheelchair or bedside chair)?: Total Help needed to walk in hospital room?: Total Help needed climbing 3-5 steps with a railing? : Total 6 Click Score: 6    End of Session   Activity Tolerance: Other (comment) (Limited by cognition) Patient left: in bed;with call bell/phone within reach;with bed alarm set   PT Visit Diagnosis: Other abnormalities of gait and mobility (R26.89)    Time: 5974-1638 PT Time Calculation (min) (ACUTE ONLY): 12 min   Charges:   PT Evaluation $PT Eval Moderate Complexity: 1 Mod          [facility] PT Acute Rehabilitation Services Pager [phone] Office [phone]   Angelina Ok [facility] 10/02/2019, 8:36 AM

## 2019-10-02 NOTE — Evaluation (Signed)
Occupational Therapy Evaluation Patient Details Name: [name] [name] MRN: [id number] DOB: 02/20/1926 Today's Date: 10/02/2019    History of Present Illness 86 yo male presenting with ED Afib RVR and confusion. Testing COVID positive. PMH including LT AKA,  paroxysmal A. fib, cerebral aneurysm, DVT, and nephrolithiasis.   Clinical Impression   PTA, pt was living with his daughter and performed BADLs and used w/c for mobility. Pt currently requiring Max-Total A for ADLs and Mod-Max A +2 for bed mobility. Attempting to sit at EOB, however, pt with significant posterior lean and HR elevating to 166. Returned to supine. Pt requiring Total A for peri care after urinary incontinence and Min-Max A for self feeding with bed in chair position. SpO2 90s on RA. Pt would benefit from further acute OT to facilitate safe dc. Recommend dc to home with hospice care per family request; would benefit from [facility] to increase occupational participation and performance as well as decrease caregiver burden.     Follow Up Recommendations  Supervision/Assistance - 24 hour;Other (comment) (Hospice)    Equipment Recommendations  Other (comment) (Defer to next venue)    Recommendations for Other Services       Precautions / Restrictions Precautions Precautions: Fall      Mobility Bed Mobility Overal bed mobility: Needs Assistance Bed Mobility: Rolling;Supine to Sit;Sit to Supine Rolling: Mod assist;+2 for physical assistance   Supine to sit: Max assist Sit to supine: Mod assist   General bed mobility comments: Pt attempting to sit EOB, but once EOB, pt with significant posterior lean. Mod A +2 for rolling during cleaning after incontience.  Transfers                 General transfer comment: Unable to attempt    Balance Overall balance assessment: Needs assistance Sitting-balance support: Bilateral upper extremity supported;Feet supported Sitting balance-Leahy Scale: Zero Sitting balance -  Comments: total assist to sit EOB due to pt attempting to lie back down. Postural control: Posterior lean                                 ADL either performed or assessed with clinical judgement   ADL Overall ADL's : Needs assistance/impaired Eating/Feeding: Maximal assistance;Bed level;Minimal assistance Eating/Feeding Details (indicate cue type and reason): Requiring Max A for eating lunch. Able to self feed with banana. Poor attention to task and decreased appetite. Grooming: Moderate assistance;Bed level   Upper Body Bathing: Maximal assistance;Sitting   Lower Body Bathing: Total assistance;Bed level   Upper Body Dressing : Maximal assistance;Bed level   Lower Body Dressing: Total assistance;Bed level               Functional mobility during ADLs: Moderate assistance;Minimal assistance;Min guard (mod assist-min guard for bed mobility and sitting EOB) General ADL Comments: Attempting to sit at EOB, but significant posterior lean. Rolling for toilet hygiene at bed level due to soiled sheets. Repositioning for eating lunch at bed level     Vision         Perception     Praxis      Pertinent Vitals/Pain Pain Assessment: Faces Faces Pain Scale: No hurt     Hand Dominance Right   Extremity/Trunk Assessment Upper Extremity Assessment Upper Extremity Assessment: Generalized weakness   Lower Extremity Assessment Lower Extremity Assessment: Defer to PT evaluation LLE Deficits / Details: AKA   Cervical / Trunk Assessment Cervical / Trunk Assessment: Other exceptions Cervical /  Trunk Exceptions: Significant posterior lean   Communication Communication Communication: Expressive difficulties (mumble unintelligibly)   Cognition Arousal/Alertness: Awake/alert Behavior During Therapy: Flat affect Overall Cognitive Status: Difficult to assess                                 General Comments: Mumbled speech. Pt stating "we are moving" and  when asked where pt stated "[place]". Pt also stating "I am lonely" when asked how he is doing. Following simple commands with increased cues and time.    General Comments  BP supine 102/71 and after sitting up 106/91. Spo2 90s on RA. HR elevating to 166 when in sitting. Resting HR 90.    Exercises     Shoulder Instructions      Home Living Family/patient expects to be discharged to:: Private residence Living Arrangements: Children Available Help at Discharge: Available PRN/intermittently                         Home Equipment: Dan Humphreys - 2 wheels;Cane - single point;Bedside commode;Shower seat;Wheelchair - manual   Additional Comments: Daughter in the process of moving to [place].      Prior Functioning/Environment Level of Independence: Needs assistance  Gait / Transfers Assistance Needed: Non ambulatory. Wheelchair transfers with supervision. Modified independent in wheelchair ADL's / Homemaking Assistance Needed: Pt performs ADLs   Comments: Prior level for recent admission in June 2021 and chart review        OT Problem List: Decreased strength;Decreased range of motion;Decreased activity tolerance;Impaired balance (sitting and/or standing);Decreased cognition;Decreased safety awareness;Decreased knowledge of use of DME or AE;Decreased knowledge of precautions;Cardiopulmonary status limiting activity      OT Treatment/Interventions: Self-care/ADL training;Therapeutic exercise;Energy conservation;DME and/or AE instruction;Therapeutic activities;Patient/family education    OT Goals(Current goals can be found in the care plan section) Acute Rehab OT Goals Patient Stated Goal: Unstated OT Goal Formulation: Patient unable to participate in goal setting Time For Goal Achievement: 10/16/19 Potential to Achieve Goals: Fair  OT Frequency: Min 1X/week   Barriers to D/C:            Co-evaluation              AM-PAC OT "6 Clicks" Daily Activity     Outcome  Measure Help from another person eating meals?: A Lot Help from another person taking care of personal grooming?: A Lot Help from another person toileting, which includes using toliet, bedpan, or urinal?: Total Help from another person bathing (including washing, rinsing, drying)?: A Lot Help from another person to put on and taking off regular upper body clothing?: A Lot Help from another person to put on and taking off regular lower body clothing?: Total 6 Click Score: 10   End of Session Nurse Communication: Mobility status;Other (comment) (Slight wound at backside.)  Activity Tolerance: Patient limited by fatigue Patient left: in bed;with call bell/phone within reach;with bed alarm set  OT Visit Diagnosis: Unsteadiness on feet (R26.81);Other abnormalities of gait and mobility (R26.89);Muscle weakness (generalized) (M62.81)                Time: 0867-6195 OT Time Calculation (min): 31 min Charges:  OT General Charges $OT Visit: 1 Visit OT Evaluation $OT Eval Moderate Complexity: 1 Mod OT Treatments $Self Care/Home Management : 8-22 mins  Dmonte Maher MSOT, OTR/L Acute Rehab Pager: [phone] Office: [phone]  Theodoro Grist Kaidynce Pfister 10/02/2019, 2:50 PM

## 2019-10-02 NOTE — Progress Notes (Signed)
PHARMACY - PHYSICIAN COMMUNICATION CRITICAL VALUE ALERT - BLOOD CULTURE IDENTIFICATION (BCID)  [name] [name] is an 86 y.o. male who presented to [facility] on 09/29/2019 with a chief complaint of COVID.  Assessment:  Update to previous BCx, 2/3 aerobic with GPC and GPR; previous shows staph epi (MEC +).   Name of physician (or Provider) Contacted: Raliegh Ip, MD via secure chat  Current antibiotics: azithromycin + ceftriaxone   Changes to prescribed antibiotics recommended:  Patient is on recommended antibiotics - No changes needed  Results for orders placed or performed during the hospital encounter of 09/29/19  Blood Culture ID Panel (Reflexed) (Collected: 09/29/2019  8:26 PM)  Result Value Ref Range   Enterococcus faecalis NOT DETECTED NOT DETECTED   Enterococcus Faecium NOT DETECTED NOT DETECTED   Listeria monocytogenes NOT DETECTED NOT DETECTED   Staphylococcus species DETECTED (A) NOT DETECTED   Staphylococcus aureus (BCID) NOT DETECTED NOT DETECTED   Staphylococcus epidermidis DETECTED (A) NOT DETECTED   Staphylococcus lugdunensis NOT DETECTED NOT DETECTED   Streptococcus species NOT DETECTED NOT DETECTED   Streptococcus agalactiae NOT DETECTED NOT DETECTED   Streptococcus pneumoniae NOT DETECTED NOT DETECTED   Streptococcus pyogenes NOT DETECTED NOT DETECTED   A.calcoaceticus-baumannii NOT DETECTED NOT DETECTED   Bacteroides fragilis NOT DETECTED NOT DETECTED   Enterobacterales NOT DETECTED NOT DETECTED   Enterobacter cloacae complex NOT DETECTED NOT DETECTED   Escherichia coli NOT DETECTED NOT DETECTED   Klebsiella aerogenes NOT DETECTED NOT DETECTED   Klebsiella oxytoca NOT DETECTED NOT DETECTED   Klebsiella pneumoniae NOT DETECTED NOT DETECTED   Proteus species NOT DETECTED NOT DETECTED   Salmonella species NOT DETECTED NOT DETECTED   Serratia marcescens NOT DETECTED NOT DETECTED   Haemophilus influenzae NOT DETECTED NOT DETECTED   Neisseria meningitidis NOT DETECTED  NOT DETECTED   Pseudomonas aeruginosa NOT DETECTED NOT DETECTED   Stenotrophomonas maltophilia NOT DETECTED NOT DETECTED   Candida albicans NOT DETECTED NOT DETECTED   Candida auris NOT DETECTED NOT DETECTED   Candida glabrata NOT DETECTED NOT DETECTED   Candida krusei NOT DETECTED NOT DETECTED   Candida parapsilosis NOT DETECTED NOT DETECTED   Candida tropicalis NOT DETECTED NOT DETECTED   Cryptococcus neoformans/gattii NOT DETECTED NOT DETECTED   Methicillin resistance mecA/C DETECTED (A) NOT DETECTED    [name] [name] 10/02/2019  1:00 AM

## 2019-10-02 NOTE — NC FL2 (Signed)
[place] MEDICAID FL2 LEVEL OF CARE SCREENING TOOL     IDENTIFICATION  Patient Name: [name] [name] Birthdate: Jul 25, 1926 Sex: male Admission Date (Current Location): 09/29/2019  [facility] and [place] Number:  Producer, television/film/video and Address:  [facility], [address], [place], [place] [address]      Provider Number: [id number]  Attending Physician Name and Address:  Dorcas Carrow, MD  Relative Name and Phone Number:  [name] daughter, [phone]    Current Level of Care: Hospital Recommended Level of Care: Skilled Nursing Facility Prior Approval Number:    Date Approved/Denied:   PASRR Number: [id number]  Discharge Plan: SNF    Current Diagnoses: Patient Active Problem List   Diagnosis Date Noted  . Sepsis with acute renal failure and septic shock (HCC)   . Weakness   . Encounter for hospice care discussion   . Atrial fibrillation with rapid ventricular response (HCC)   . Palliative care by specialist   . Goals of care, counseling/discussion   . DNR (do not resuscitate)   . DNI (do not intubate)   . AKI (acute kidney injury) (HCC) 09/29/2019  . COVID-19 virus infection 09/29/2019  . Thrombocytopenia (HCC) 09/29/2019  . Atrial fibrillation with RVR (HCC) 07/24/2019  . History of cerebellar hemorrhage 07/24/2019  . History of DVT (deep vein thrombosis) 07/24/2019  . Pain due to onychomycosis of toenails of both feet 12/26/2018  . PVD (peripheral vascular disease) (HCC) 12/26/2018  . Coagulation defect (HCC) 12/26/2018  . CVA (cerebral vascular accident) (HCC) 06/08/2014  . TIA (transient ischemic attack) 05/30/2014  . Wound infection 06/22/2013  . Cerebellar hemorrhage, acute (HCC) 05/14/2013  . Peripheral arterial disease (HCC)   . DVT (deep venous thrombosis) (HCC)   . Atrial fibrillation (HCC) 06/19/2010  . Long term (current) use of anticoagulants 06/19/2010    Orientation RESPIRATION BLADDER Height & Weight     Self  Normal  Incontinent, External catheter Weight: 152 lb 5.4 oz (69.1 kg) Height:  5\' 5"  (165.1 cm)  BEHAVIORAL SYMPTOMS/MOOD NEUROLOGICAL BOWEL NUTRITION STATUS      Incontinent Diet (Please see DC Summary)  AMBULATORY STATUS COMMUNICATION OF NEEDS Skin   Extensive Assist Verbally Normal                       Personal Care Assistance Level of Assistance  Bathing, Feeding, Dressing Bathing Assistance: Maximum assistance Feeding assistance: Limited assistance Dressing Assistance: Maximum assistance     Functional Limitations Info  Sight, Hearing, Speech Sight Info: Adequate Hearing Info: Adequate Speech Info: Adequate    SPECIAL CARE FACTORS FREQUENCY  PT (By licensed PT), OT (By licensed OT)     PT Frequency: 5x/week OT Frequency: 5x/week            Contractures Contractures Info: Not present    Additional Factors Info  Code Status, Allergies, Isolation Precautions Code Status Info: DNR Allergies Info: Coumadin     Isolation Precautions Info: COVID+     Current Medications (10/02/2019):  This is the current hospital active medication list Current Facility-Administered Medications  Medication Dose Route Frequency Provider Last Rate Last Admin  . 0.9 %  sodium chloride infusion   Intravenous Continuous 10/04/2019, MD 125 mL/hr at 09/30/19 1916 New Bag at 09/30/19 1916  . acetaminophen (TYLENOL) tablet 650 mg  650 mg Oral Q6H PRN 10/02/19 L, MD      . albuterol (VENTOLIN HFA) 108 (90 Base) MCG/ACT inhaler 2  puff  2 puff Inhalation Q6H Rometta Emery, MD   2 puff at 10/02/19 0847  . ascorbic acid (VITAMIN C) tablet 500 mg  500 mg Oral Daily Rometta Emery, MD   500 mg at 10/02/19 0851  . chlorpheniramine-HYDROcodone (TUSSIONEX) 10-8 MG/5ML suspension 5 mL  5 mL Oral Q12H PRN Earlie Lou L, MD      . dexamethasone (DECADRON) injection 6 mg  6 mg Intravenous Q24H Rometta Emery, MD   6 mg at 10/02/19 0851  . guaiFENesin-dextromethorphan (ROBITUSSIN  DM) 100-10 MG/5ML syrup 10 mL  10 mL Oral Q4H PRN Earlie Lou L, MD      . metoprolol tartrate (LOPRESSOR) tablet 25 mg  25 mg Oral BID Dorcas Carrow, MD   25 mg at 10/02/19 0852  . ondansetron (ZOFRAN) tablet 4 mg  4 mg Oral Q6H PRN Rometta Emery, MD       Or  . ondansetron (ZOFRAN) injection 4 mg  4 mg Intravenous Q6H PRN Rometta Emery, MD      . remdesivir 100 mg in sodium chloride 0.9 % 100 mL IVPB  100 mg Intravenous Daily Earlie Lou L, MD 200 mL/hr at 10/02/19 0846 100 mg at 10/02/19 0846  . zinc sulfate capsule 220 mg  220 mg Oral Daily Rometta Emery, MD   220 mg at 10/02/19 9030     Discharge Medications: Please see discharge summary for a list of discharge medications.  Relevant Imaging Results:  Relevant Lab Results:   Additional Information SSN: [id number] [place], [place]

## 2019-10-02 NOTE — Progress Notes (Signed)
CSW received SNF consult. CSW notes patient unable to participate in therapy at this time. Will continue to follow, as insurance will not approve SNF if patient cannot participate in rehab.   Markesia Crilly LCSW

## 2019-10-02 NOTE — Progress Notes (Signed)
Daily Progress Note   Patient Name: [name] [name]       Date: 10/02/2019 DOB: 05-Jun-1926  Age: 86 y.o. MRN#: [id number] Attending Physician: Dorcas Carrow, MD Primary Care Physician: Elias Else, MD Admit Date: 09/29/2019  Reason for Consultation/Follow-up: Disposition and Establishing goals of care  Subjective: Chart review performed. Received report from primary RN - patient refused breakfast but did drink a bottle of ensure. Patient remains confused and oriented to person only.  Called and spoke with [name] [name] via phone. Provided update that patient was resistant to mobility with PT this morning and did not participate in therapy, as well as still having a very decreased PO intake. Also reviewed lab work. Aggressive medical intervention vs comfort path was reviewed again today. Hospice services and eligibility was discussed. [name] [name] is now most interested in discharging the patient home with hospice support. However, she sold her house yesterday and is closing on a house in [name], [place] [address] on Monday - she is very uncertain on how the transition will work. She expressed worry that the patient would not be able to tolerate the drive to [name] in her private vehicle. Explained that there are non-emergent transport options and that TOC could reach out with more information if she was interested - [name] was appreciative and interested in gaining more information. I also informed [name] that [name] can also connect patient with a hospice facility close to the location they are moving.   [name] [name] expressed appreciation for call this morning. Addressed all questions and concerns. Encouraged to call with any other questions/concerns.   Length of Stay: 3  Current Medications: Scheduled Meds:  .  albuterol  2 puff Inhalation Q6H  . vitamin C  500 mg Oral Daily  . dexamethasone (DECADRON) injection  6 mg Intravenous Q24H  . metoprolol tartrate  25 mg Oral BID  . zinc sulfate  220 mg Oral Daily    Continuous Infusions: . sodium chloride 125 mL/hr at 09/30/19 1916  . azithromycin Stopped (10/01/19 2335)  . cefTRIAXone (ROCEPHIN)  IV Stopped (10/01/19 2010)  . remdesivir 100 mg in NS 100 mL 100 mg (10/02/19 0846)    PRN Meds: acetaminophen, chlorpheniramine-HYDROcodone, guaiFENesin-dextromethorphan, ondansetron **OR** ondansetron (ZOFRAN) IV    Vital Signs: BP (!) 101/59   Pulse (!) 106   Temp 98.7 F (37.1 C) (  Oral)   Resp 18   Ht 5\' 5"  (1.651 m)   Wt 69.1 kg   SpO2 99%   BMI 25.35 kg/m  SpO2: SpO2: 99 % O2 Device: O2 Device: Room Air O2 Flow Rate:    Intake/output summary:   Intake/Output Summary (Last 24 hours) at 10/02/2019 1112 Last data filed at 10/02/2019 0446 Gross per 24 hour  Intake 1530 ml  Output --  Net 1530 ml   LBM: Last BM Date: 10/01/19 Baseline Weight: Weight: 69.1 kg Most recent weight: Weight: 69.1 kg       Palliative Assessment/Data: PPS 20%      Patient Active Problem List   Diagnosis Date Noted  . Atrial fibrillation with rapid ventricular response (HCC)   . Palliative care by specialist   . Goals of care, counseling/discussion   . DNR (do not resuscitate)   . DNI (do not intubate)   . AKI (acute kidney injury) (HCC) 09/29/2019  . COVID-19 virus infection 09/29/2019  . Thrombocytopenia (HCC) 09/29/2019  . Atrial fibrillation with RVR (HCC) 07/24/2019  . History of cerebellar hemorrhage 07/24/2019  . History of DVT (deep vein thrombosis) 07/24/2019  . Pain due to onychomycosis of toenails of both feet 12/26/2018  . PVD (peripheral vascular disease) (HCC) 12/26/2018  . Coagulation defect (HCC) 12/26/2018  . CVA (cerebral vascular accident) (HCC) 06/08/2014  . TIA (transient ischemic attack) 05/30/2014  . Wound infection  06/22/2013  . Cerebellar hemorrhage, acute (HCC) 05/14/2013  . Peripheral arterial disease (HCC)   . DVT (deep venous thrombosis) (HCC)   . Atrial fibrillation (HCC) 06/19/2010  . Long term (current) use of anticoagulants 06/19/2010    Palliative Care Assessment & Plan   Patient Profile: 86 y.o. male  with past medical history of stroke, PAD, DVT, atrial fibrillation, and cerebral aneurism admitted on 09/29/2019 with COVID19 infection, atrial fibrillation with RVR, AMS, and AKI.   Assessment: COVID19 infection Sepsis Acute metabolic encephalopathy PVD Atrial fibrillation with RVR History of DVT AKI CKD  Thrombocytopenia Failure to thrive Weakness  Recommendations/Plan: Continue current full scope medical treatment  Continue DNR/DNI as previously documented [name] is now most interested in home hospice. However, she is moving to [name] 10/01/2019 on Monday and unsure how that transition will take place - she does not feel comfortable driving him in a private vehicle for that distance. She would like more information on non-emergent transport options. TOC consult placed for transportation needs and connection with a hospice facility in [name] PMT will continue to follow peripherally. If there are any imminent needs please call the service directly   Goals of Care and Additional Recommendations: Limitations on Scope of Treatment: Full Scope Treatment  Code Status:    Code Status Orders  (From admission, onward)         Start     Ordered   09/30/19 0718  Do not attempt resuscitation (DNR)  Continuous       Question Answer Comment  In the event of cardiac or respiratory ARREST Do not call a "code blue"   In the event of cardiac or respiratory ARREST Do not perform Intubation, CPR, defibrillation or ACLS   In the event of cardiac or respiratory ARREST Use medication by any route, position, wound care, and other measures to relive pain and suffering. May  use oxygen, suction and manual treatment of airway obstruction as needed for comfort.      09/30/19 0717        Code Status  History    Date Active Date Inactive Code Status Order ID Comments User Context   07/24/2019 2157 07/28/2019 1550 Full Code [id number]  Joselyn Arrow, MD Inpatient   05/30/2014 1515 05/31/2014 1856 Full Code [id number]  Rhetta Mura, MD Inpatient   06/22/2013 1713 06/24/2013 1636 Full Code [id number]  Tressie Stalker, MD Inpatient   05/14/2013 0049 05/23/2013 2017 Full Code [id number]  Tressie Stalker, MD Inpatient   05/13/2013 1858 05/14/2013 0049 Full Code [id number]  Toney Sang, MD ED   Advance Care Planning Activity      Prognosis:  < 2 weeks  Discharge Planning: Home with Hospice in [name]  Care plan was discussed with primary RN, daughter/Elaine, Dr. Jerral Ralph, [facility], [facility] liaison  Thank you for allowing the Palliative Medicine Team to assist in the care of this patient.   Total Time 35 minutes Prolonged Time Billed  no       Greater than 50%  of this time was spent counseling and coordinating care related to the above assessment and plan.  Haskel Khan, NP  Please contact Palliative Medicine Team phone at [phone] for questions and concerns.

## 2019-10-03 LAB — D-DIMER, QUANTITATIVE: D-Dimer, Quant: 7.82 ug/mL-FEU — ABNORMAL HIGH (ref 0.00–0.50)

## 2019-10-03 LAB — CBC WITH DIFFERENTIAL/PLATELET
Abs Immature Granulocytes: 0.16 10*3/uL — ABNORMAL HIGH (ref 0.00–0.07)
Basophils Absolute: 0 10*3/uL (ref 0.0–0.1)
Basophils Relative: 0 %
Eosinophils Absolute: 0 10*3/uL (ref 0.0–0.5)
Eosinophils Relative: 0 %
HCT: 38 % — ABNORMAL LOW (ref 39.0–52.0)
Hemoglobin: 12.1 g/dL — ABNORMAL LOW (ref 13.0–17.0)
Immature Granulocytes: 2 %
Lymphocytes Relative: 7 %
Lymphs Abs: 0.5 10*3/uL — ABNORMAL LOW (ref 0.7–4.0)
MCH: 27.1 pg (ref 26.0–34.0)
MCHC: 31.8 g/dL (ref 30.0–36.0)
MCV: 85 fL (ref 80.0–100.0)
Monocytes Absolute: 0.6 10*3/uL (ref 0.1–1.0)
Monocytes Relative: 9 %
Neutro Abs: 5.8 10*3/uL (ref 1.7–7.7)
Neutrophils Relative %: 82 %
Platelets: 65 10*3/uL — ABNORMAL LOW (ref 150–400)
RBC: 4.47 MIL/uL (ref 4.22–5.81)
RDW: 15.5 % (ref 11.5–15.5)
WBC: 7.1 10*3/uL (ref 4.0–10.5)
nRBC: 0.3 % — ABNORMAL HIGH (ref 0.0–0.2)

## 2019-10-03 LAB — COMPREHENSIVE METABOLIC PANEL
ALT: 60 U/L — ABNORMAL HIGH (ref 0–44)
AST: 89 U/L — ABNORMAL HIGH (ref 15–41)
Albumin: 1.9 g/dL — ABNORMAL LOW (ref 3.5–5.0)
Alkaline Phosphatase: 53 U/L (ref 38–126)
Anion gap: 11 (ref 5–15)
BUN: 30 mg/dL — ABNORMAL HIGH (ref 8–23)
CO2: 20 mmol/L — ABNORMAL LOW (ref 22–32)
Calcium: 7.2 mg/dL — ABNORMAL LOW (ref 8.9–10.3)
Chloride: 116 mmol/L — ABNORMAL HIGH (ref 98–111)
Creatinine, Ser: 0.87 mg/dL (ref 0.61–1.24)
GFR calc Af Amer: >60 mL/min (ref >60)
GFR calc non Af Amer: >60 mL/min (ref >60)
Glucose, Bld: 116 mg/dL — ABNORMAL HIGH (ref 70–99)
Potassium: 3.7 mmol/L (ref 3.5–5.1)
Sodium: 147 mmol/L — ABNORMAL HIGH (ref 135–145)
Total Bilirubin: 0.6 mg/dL (ref 0.3–1.2)
Total Protein: 5.1 g/dL — ABNORMAL LOW (ref 6.5–8.1)

## 2019-10-03 LAB — MAGNESIUM: Magnesium: 1.9 mg/dL (ref 1.7–2.4)

## 2019-10-03 LAB — C-REACTIVE PROTEIN: CRP: 1.4 mg/dL — ABNORMAL HIGH (ref <1.0)

## 2019-10-03 LAB — FERRITIN: Ferritin: 1453 ng/mL — ABNORMAL HIGH (ref 24–336)

## 2019-10-03 NOTE — Progress Notes (Signed)
PROGRESS NOTE    Gurkirat Watson  AOZ:[id number] Blamer:[id number] DOB: 06/16/1926 DOA: 09/29/2019 PCP: Elias Elseeade, Robert, MD    Brief Narrative:  86 year old male with history of paroxysmal A. fib on metoprolol, not on anticoagulation because of previous severe bleeding, history of DVT presented to the emergency room for on and off confusion.  Lives at home with daughter.  He was acting off as per the family.  When EMS arrived at home, he was found with A. fib with RVR with heart rate 180.  They started patient on 20 mg of bolus Cardizem and IV fluids and brought to the ER.  In the emergency room, temperature 100, blood pressure 84/65, respiratory 32.  94% on room air.  AKI.  Mildly elevated transaminases.  Chest x-ray normal.  Head CT normal.  COVID-19 positive.  EKG with rapid A. fib.  Lactic acid 5.4.   Assessment & Plan:   Principal Problem:   COVID-19 virus infection Active Problems:   PVD (peripheral vascular disease) (HCC)   Atrial fibrillation with RVR (HCC)   History of DVT (deep vein thrombosis)   AKI (acute kidney injury) (HCC)   Thrombocytopenia (HCC)   Atrial fibrillation with rapid ventricular response (HCC)   Palliative care by specialist   Goals of care, counseling/discussion   DNR (do not resuscitate)   DNI (do not intubate)   Sepsis with acute renal failure and septic shock (HCC)   Weakness   Encounter for hospice care discussion  Sepsis present on admission with lactic acidosis, hypotension in the setting of COVID-19 viral infection, severe dehydration.   Patient is mostly on room air.  No pulmonary symptoms. chest physiotherapy, incentive spirometry, deep breathing exercises, sputum induction, mucolytic's and bronchodilators as much patient tolerates. Supplemental oxygen to keep saturations more than 90%. Covid directed therapy with , steroids dexamethasone daily remdesivir day 5/5 Antibiotics were discontinued.  Does not show any evidence of active infection. Blood cultures  positive for Staph epidermidis, probably contaminant.  COVID-19 Labs  Recent Labs    10/01/19 0816 10/03/19 0821  DDIMER 9.28* 7.82*  FERRITIN 1,821* 1,453*  CRP 4.3* 1.4*    Lab Results  Component Value Date   SARSCOV2NAA POSITIVE (A) 09/29/2019   SARSCOV2NAA NEGATIVE 07/24/2019   SpO2: 98 %   Chronic A. fib with RVR: Initially treated with Cardizem drip.  Not an anticoagulation candidate. Patient on metoprolol at home that was continued.  Heart rate is a still suboptimally controlled.  Will increase dose to 50 mg twice a day see if he tolerates it.  Acute metabolic encephalopathy: With some evidence of baseline dementia.  Improving.  Acute kidney injury on chronic kidney disease stage II: Continue to monitor on IV fluids.  Normalized.  Goal of care/advance care planning: Patient remains quite debilitated with advanced medical conditions.  Debilitation worse with ongoing COVID-19 virus infection and multiple medical issues. Work with PT OT.  Short-term skilled nursing facility rehab and if no adequate improvement hospice at home.  DVT prophylaxis: SCDs Start: 09/30/19 [id number]   Code Status: DNR Family Communication: Patient's daughter on the phone. Disposition Plan: Status is: Inpatient  Remains inpatient appropriate because:IV treatments appropriate due to intensity of illness or inability to take PO   Dispo: The patient is from: Home              Anticipated d/c is to: Skilled nursing facility.              Anticipated d/c date is: 1 to 2 days.  Patient currently is not medically stable to d/c.    Consultants:   Palliative care   Procedures:   None  Antimicrobials:  Antibiotics Given (last 72 hours)    Date/Time Action Medication Dose Rate   09/30/19 1919 New Bag/Given   cefTRIAXone (ROCEPHIN) 2 g in sodium chloride 0.9 % 100 mL IVPB 2 g 200 mL/hr   09/30/19 1929 New Bag/Given   azithromycin (ZITHROMAX) 500 mg in sodium chloride 0.9 % 250 mL  IVPB 500 mg 250 mL/hr   10/01/19 1029 New Bag/Given   remdesivir 100 mg in sodium chloride 0.9 % 100 mL IVPB 100 mg 200 mL/hr   10/01/19 1933 New Bag/Given   cefTRIAXone (ROCEPHIN) 2 g in sodium chloride 0.9 % 100 mL IVPB 2 g 200 mL/hr   10/01/19 2234 New Bag/Given   azithromycin (ZITHROMAX) 500 mg in sodium chloride 0.9 % 250 mL IVPB 500 mg 250 mL/hr   10/02/19 0846 New Bag/Given   remdesivir 100 mg in sodium chloride 0.9 % 100 mL IVPB 100 mg 200 mL/hr   10/03/19 1220 New Bag/Given   remdesivir 100 mg in sodium chloride 0.9 % 100 mL IVPB 100 mg 200 mL/hr         Subjective: Seen and examined.  No overnight events.  He himself denies any complaints.  Pleasantly confused. Objective: Vitals:   10/02/19 0848 10/02/19 1517 10/03/19 0800 10/03/19 1226  BP: (!) 101/59 114/80 (!) 90/59 100/66  Pulse: (!) 106 90  100  Resp:  (!) 21 17 20   Temp:  98 F (36.7 C)    TempSrc:  Oral    SpO2:    98%  Weight:      Height:        Intake/Output Summary (Last 24 hours) at 10/03/2019 1320 Last data filed at 10/03/2019 0700 Gross per 24 hour  Intake 120 ml  Output 500 ml  Net -380 ml   Filed Weights   10/01/19 1700  Weight: 69.1 kg    Examination:  General exam: Appears calm and comfortable , debilitated and chronically sick looking. confused and pleasant. Not in any distress.  On room air. Respiratory system: Clear to auscultation. Respiratory effort normal.  Some conducted airway sounds. Cardiovascular system: S1 & S2 heard, irregularly irregular.  No JVD, murmurs, rubs, gallops or clicks. No pedal edema. Left above-knee amputation stump clean and dry. Gastrointestinal system: Abdomen is nondistended, soft and nontender. No organomegaly or masses felt. Normal bowel sounds heard.     Data Reviewed: I have personally reviewed following labs and imaging studies  CBC: Recent Labs  Lab 09/29/19 1832 10/01/19 1033 10/03/19 0821  WBC 9.3 6.2 7.1  NEUTROABS  --   --  5.8  HGB  15.2 12.1* 12.1*  HCT 48.7 38.7* 38.0*  MCV 86.8 86.2 85.0  PLT 79* 65* 65*   Basic Metabolic Panel: Recent Labs  Lab 09/29/19 1832 09/30/19 0338 10/01/19 0816 10/03/19 0821  NA 141 141 141 147*  K 4.0 3.9 4.3 3.7  CL 107 109 110 116*  CO2 19* 21* 22 20*  GLUCOSE 164* 133* 132* 116*  BUN 50* 43* 40* 30*  CREATININE 1.78* 1.35* 1.19 0.87  CALCIUM 8.1* 7.4* 7.4* 7.2*  MG  --  1.6* 2.1 1.9  PHOS  --  3.2  --   --    GFR: Estimated Creatinine Clearance: 48.1 mL/min (by C-G formula based on SCr of 0.87 mg/dL). Liver Function Tests: Recent Labs  Lab 09/29/19 1832 09/30/19 10/02/19  10/01/19 0816 10/03/19 0821  AST 128*  --  102* 89*  ALT 51*  --  43 60*  ALKPHOS 55  --  41 53  BILITOT 0.9  --  0.6 0.6  PROT 6.9  --  5.4* 5.1*  ALBUMIN 2.5* 2.0* 2.0* 1.9*   No results for input(s): LIPASE, AMYLASE in the last 168 hours. No results for input(s): AMMONIA in the last 168 hours. Coagulation Profile: Recent Labs  Lab 09/29/19 2026  INR 1.2   Cardiac Enzymes: No results for input(s): CKTOTAL, CKMB, CKMBINDEX, TROPONINI in the last 168 hours. BNP (last 3 results) No results for input(s): PROBNP in the last 8760 hours. HbA1C: No results for input(s): HGBA1C in the last 72 hours. CBG: Recent Labs  Lab 09/29/19 2304  GLUCAP 127*   Lipid Profile: No results for input(s): CHOL, HDL, LDLCALC, TRIG, CHOLHDL, LDLDIRECT in the last 72 hours. Thyroid Function Tests: No results for input(s): TSH, T4TOTAL, FREET4, T3FREE, THYROIDAB in the last 72 hours. Anemia Panel: Recent Labs    10/01/19 0816 10/03/19 0821  FERRITIN 1,821* 1,453*   Sepsis Labs: Recent Labs  Lab 09/29/19 1836 09/30/19 0338  LATICACIDVEN 5.4* 2.3*    Recent Results (from the past 240 hour(s))  Blood Culture (routine x 2)     Status: Abnormal   Collection Time: 09/29/19  8:26 PM   Specimen: BLOOD  Result Value Ref Range Status   Specimen Description BLOOD RIGHT ANTECUBITAL  Final   Special  Requests   Final    BOTTLES DRAWN AEROBIC AND ANAEROBIC Blood Culture adequate volume   Culture  Setup Time   Final    GRAM POSITIVE COCCI IN CLUSTERS ANAEROBIC BOTTLE ONLY Organism ID to follow CRITICAL RESULT CALLED TO, READ BACK BY AND VERIFIED WITH: ANDREW MEYER PHARMD  09/30/19 EB AEROBIC BOTTLE ONLY GRAM POSITIVE COCCI GRAM POSITIVE RODS CRITICAL RESULT CALLED TO, READ BACK BY AND VERIFIED WITH: J [place] PHARMD 10/02/19 0050 JDW    Culture (A)  Final    STAPHYLOCOCCUS EPIDERMIDIS THE SIGNIFICANCE OF ISOLATING THIS ORGANISM FROM A SINGLE SET OF BLOOD CULTURES WHEN MULTIPLE SETS ARE DRAWN IS UNCERTAIN. PLEASE NOTIFY THE MICROBIOLOGY DEPARTMENT WITHIN ONE WEEK IF SPECIATION AND SENSITIVITIES ARE REQUIRED. Performed at [facility], [address] [place], [place] [address]    Report Status 10/02/2019 FINAL  Final  SARS Coronavirus 2 by RT PCR (hospital order, performed in [facility] hospital lab) Nasopharyngeal Nasopharyngeal Swab     Status: Abnormal   Collection Time: 09/29/19  8:26 PM   Specimen: Nasopharyngeal Swab  Result Value Ref Range Status   SARS Coronavirus 2 POSITIVE (A) NEGATIVE Final    Comment: RESULT CALLED TO, READ BACK BY AND VERIFIED WITH: G KOPP RN 2231 09/29/19 A BROWNING (NOTE) SARS-CoV-2 target nucleic acids are DETECTED  SARS-CoV-2 RNA is generally detectable in upper respiratory specimens  during the acute phase of infection.  Positive results are indicative  of the presence of the identified virus, but do not rule out bacterial infection or co-infection with other pathogens not detected by the test.  Clinical correlation with patient history and  other diagnostic information is necessary to determine patient infection status.  The expected result is negative.  Fact Sheet for Patients:   BoilerBrush.com.cy   Fact Sheet for Healthcare Providers:   https://pope.com/    This test is not yet  approved or cleared by the [place] FDA and  has been authorized for detection and/or diagnosis of  SARS-CoV-2 by FDA under an Emergency Use Authorization (EUA).  This EUA will remain in effect (meaning this tes t can be used) for the duration of  the COVID-19 declaration under Section 564(b)(1) of the Act, 21 U.S.C. section 360-bbb-3(b)(1), unless the authorization is terminated or revoked sooner.  Performed at [facility], [address], [place], [place] [address]   Blood Culture ID Panel (Reflexed)     Status: Abnormal   Collection Time: 09/29/19  8:26 PM  Result Value Ref Range Status   Enterococcus faecalis NOT DETECTED NOT DETECTED Final   Enterococcus Faecium NOT DETECTED NOT DETECTED Final   Listeria monocytogenes NOT DETECTED NOT DETECTED Final   Staphylococcus species DETECTED (A) NOT DETECTED Final    Comment: RESULT CALLED TO, READ BACK BY AND VERIFIED WITH: Harland German PHARMD @1955  09/30/19 EB    Staphylococcus aureus (BCID) NOT DETECTED NOT DETECTED Final   Staphylococcus epidermidis DETECTED (A) NOT DETECTED Final    Comment: Methicillin (oxacillin) resistant coagulase negative staphylococcus. Possible blood culture contaminant (unless isolated from more than one blood culture draw or clinical case suggests pathogenicity). No antibiotic treatment is indicated for blood  culture contaminants. RESULT CALLED TO, READ BACK BY AND VERIFIED WITH: ANDREW MEYER PHARMD @1955  09/30/19 EB    Staphylococcus lugdunensis NOT DETECTED NOT DETECTED Final   Streptococcus species NOT DETECTED NOT DETECTED Final   Streptococcus agalactiae NOT DETECTED NOT DETECTED Final   Streptococcus pneumoniae NOT DETECTED NOT DETECTED Final   Streptococcus pyogenes NOT DETECTED NOT DETECTED Final   A.calcoaceticus-baumannii NOT DETECTED NOT DETECTED Final   Bacteroides fragilis NOT DETECTED NOT DETECTED Final   Enterobacterales NOT DETECTED NOT DETECTED Final   Enterobacter cloacae  complex NOT DETECTED NOT DETECTED Final   Escherichia coli NOT DETECTED NOT DETECTED Final   Klebsiella aerogenes NOT DETECTED NOT DETECTED Final   Klebsiella oxytoca NOT DETECTED NOT DETECTED Final   Klebsiella pneumoniae NOT DETECTED NOT DETECTED Final   Proteus species NOT DETECTED NOT DETECTED Final   Salmonella species NOT DETECTED NOT DETECTED Final   Serratia marcescens NOT DETECTED NOT DETECTED Final   Haemophilus influenzae NOT DETECTED NOT DETECTED Final   Neisseria meningitidis NOT DETECTED NOT DETECTED Final   Pseudomonas aeruginosa NOT DETECTED NOT DETECTED Final   Stenotrophomonas maltophilia NOT DETECTED NOT DETECTED Final   Candida albicans NOT DETECTED NOT DETECTED Final   Candida auris NOT DETECTED NOT DETECTED Final   Candida glabrata NOT DETECTED NOT DETECTED Final   Candida krusei NOT DETECTED NOT DETECTED Final   Candida parapsilosis NOT DETECTED NOT DETECTED Final   Candida tropicalis NOT DETECTED NOT DETECTED Final   Cryptococcus neoformans/gattii NOT DETECTED NOT DETECTED Final   Methicillin resistance mecA/C DETECTED (A) NOT DETECTED Final    Comment: RESULT CALLED TO, READ BACK BY AND VERIFIED WITH: PHARMD @1955  09/30/19 EB Performed at [facility], [address], [place], [facility] [address]   Blood Culture (routine x 2)     Status: None (Preliminary result)   Collection Time: 09/29/19  8:50 PM   Specimen: BLOOD RIGHT HAND  Result Value Ref Range Status   Specimen Description BLOOD RIGHT HAND  Final   Special Requests   Final    BOTTLES DRAWN AEROBIC ONLY Blood Culture results may not be optimal due to an inadequate volume of blood received in culture bottles   Culture   Final    NO GROWTH 3 DAYS Performed at [facility], [address] [place], [place] [address]    Report Status PENDING  Incomplete         Radiology Studies: No results found.      Scheduled Meds: . albuterol  2 puff Inhalation Q6H  . vitamin C   500 mg Oral Daily  . dexamethasone (DECADRON) injection  6 mg Intravenous Q24H  . metoprolol tartrate  50 mg Oral BID  . zinc sulfate  220 mg Oral Daily   Continuous Infusions: . sodium chloride 125 mL/hr at 10/03/19 1219     LOS: 4 days    Time spent: 30 minutes    Dorcas Carrow, MD [facility] Pager [phone]

## 2019-10-03 NOTE — TOC Progression Note (Addendum)
Transition of Care [facility]) - Progression Note    Patient Details  Name: [name] MRN: [id number] Date of Birth: 03-22-1926  [facility] CM/SW Contact  Levada Schilling Phone Number: 10/03/2019, 2:11 PM  Clinical Narrative:    [facility] does not manage pt. Will update [employer].  TOC Team will continue to assist for discharging needs.     Barriers to Discharge: Continued Medical Work up, [employer], English as a second language teacher, Transportation, SNF Pending bed offer  Expected Discharge Plan and Services   In-house Referral: Clinical Social Work, Hospice / Palliative Care   Post Acute Care Choice: Skilled Nursing Facility Living arrangements for the past 2 months: Single Family Home                                       Social Determinants of Health (SDOH) Interventions    Readmission Risk Interventions No flowsheet data found.

## 2019-10-04 LAB — CBC WITH DIFFERENTIAL/PLATELET
Abs Immature Granulocytes: 0.15 10*3/uL — ABNORMAL HIGH (ref 0.00–0.07)
Basophils Absolute: 0 10*3/uL (ref 0.0–0.1)
Basophils Relative: 0 %
Eosinophils Absolute: 0 10*3/uL (ref 0.0–0.5)
Eosinophils Relative: 0 %
HCT: 37.9 % — ABNORMAL LOW (ref 39.0–52.0)
Hemoglobin: 11.6 g/dL — ABNORMAL LOW (ref 13.0–17.0)
Immature Granulocytes: 2 %
Lymphocytes Relative: 8 %
Lymphs Abs: 0.6 10*3/uL — ABNORMAL LOW (ref 0.7–4.0)
MCH: 26.5 pg (ref 26.0–34.0)
MCHC: 30.6 g/dL (ref 30.0–36.0)
MCV: 86.5 fL (ref 80.0–100.0)
Monocytes Absolute: 0.6 10*3/uL (ref 0.1–1.0)
Monocytes Relative: 9 %
Neutro Abs: 6.1 10*3/uL (ref 1.7–7.7)
Neutrophils Relative %: 81 %
Platelets: 74 10*3/uL — ABNORMAL LOW (ref 150–400)
RBC: 4.38 MIL/uL (ref 4.22–5.81)
RDW: 16.2 % — ABNORMAL HIGH (ref 11.5–15.5)
WBC: 7.5 10*3/uL (ref 4.0–10.5)
nRBC: 0 % (ref 0.0–0.2)

## 2019-10-04 LAB — URINE CULTURE

## 2019-10-04 LAB — COMPREHENSIVE METABOLIC PANEL
ALT: 58 U/L — ABNORMAL HIGH (ref 0–44)
AST: 66 U/L — ABNORMAL HIGH (ref 15–41)
Albumin: 1.9 g/dL — ABNORMAL LOW (ref 3.5–5.0)
Alkaline Phosphatase: 61 U/L (ref 38–126)
Anion gap: 8 (ref 5–15)
BUN: 26 mg/dL — ABNORMAL HIGH (ref 8–23)
CO2: 23 mmol/L (ref 22–32)
Calcium: 7.2 mg/dL — ABNORMAL LOW (ref 8.9–10.3)
Chloride: 116 mmol/L — ABNORMAL HIGH (ref 98–111)
Creatinine, Ser: 0.86 mg/dL (ref 0.61–1.24)
GFR calc Af Amer: >60 mL/min (ref >60)
GFR calc non Af Amer: >60 mL/min (ref >60)
Glucose, Bld: 113 mg/dL — ABNORMAL HIGH (ref 70–99)
Potassium: 3.6 mmol/L (ref 3.5–5.1)
Sodium: 147 mmol/L — ABNORMAL HIGH (ref 135–145)
Total Bilirubin: 0.5 mg/dL (ref 0.3–1.2)
Total Protein: 5 g/dL — ABNORMAL LOW (ref 6.5–8.1)

## 2019-10-04 LAB — FERRITIN: Ferritin: 1307 ng/mL — ABNORMAL HIGH (ref 24–336)

## 2019-10-04 LAB — C-REACTIVE PROTEIN: CRP: 0.9 mg/dL (ref <1.0)

## 2019-10-04 LAB — CULTURE, BLOOD (ROUTINE X 2): Culture: NO GROWTH

## 2019-10-04 LAB — MAGNESIUM: Magnesium: 1.7 mg/dL (ref 1.7–2.4)

## 2019-10-04 LAB — D-DIMER, QUANTITATIVE: D-Dimer, Quant: 9.4 ug/mL-FEU — ABNORMAL HIGH (ref 0.00–0.50)

## 2019-10-04 MED ORDER — ALBUTEROL SULFATE HFA 108 (90 BASE) MCG/ACT IN AERS: 2.0000 | INHALATION_SPRAY | Freq: Two times a day (BID) | RESPIRATORY_TRACT | Status: DC

## 2019-10-04 MED ORDER — ALBUTEROL SULFATE HFA 108 (90 BASE) MCG/ACT IN AERS: 2.0000 | INHALATION_SPRAY | Freq: Four times a day (QID) | RESPIRATORY_TRACT | Status: DC | PRN

## 2019-10-04 NOTE — Progress Notes (Signed)
PROGRESS NOTE    Ermine Spofford  JXB:[id number] DOB: 01-Apr-1926 DOA: 09/29/2019 PCP: Elias Else, MD    Brief Narrative:  86 year old male with history of paroxysmal A. fib on metoprolol, not on anticoagulation because of previous severe bleeding, history of DVT presented to the emergency room for on and off confusion.  Lives at home with daughter.  He was acting off as per the family.  When EMS arrived at home, he was found with A. fib with RVR with heart rate 180.  They started patient on 20 mg of bolus Cardizem and IV fluids and brought to the ER.  In the emergency room, temperature 100, blood pressure 84/65, respiratory 32.  94% on room air.  AKI.  Mildly elevated transaminases.  Chest x-ray normal.  Head CT normal.  COVID-19 positive.  EKG with rapid A. fib.  Lactic acid 5.4.   Assessment & Plan:   Principal Problem:   COVID-19 virus infection Active Problems:   PVD (peripheral vascular disease) (HCC)   Atrial fibrillation with RVR (HCC)   History of DVT (deep vein thrombosis)   AKI (acute kidney injury) (HCC)   Thrombocytopenia (HCC)   Atrial fibrillation with rapid ventricular response (HCC)   Palliative care by specialist   Goals of care, counseling/discussion   DNR (do not resuscitate)   DNI (do not intubate)   Sepsis with acute renal failure and septic shock (HCC)   Weakness   Encounter for hospice care discussion  Sepsis present on admission with lactic acidosis, hypotension in the setting of COVID-19 viral infection, severe dehydration.   Patient is mostly on room air.  No pulmonary symptoms. chest physiotherapy, incentive spirometry, deep breathing exercises, sputum induction, mucolytic's and bronchodilators as much patient tolerates. Supplemental oxygen to keep saturations more than 90%. Covid directed therapy with , steroids dexamethasone daily remdesivir day 5/5 Antibiotics were discontinued.  Does not show any evidence of active infection. Blood cultures  positive for Staph epidermidis, probably contaminant.  COVID-19 Labs  Recent Labs    10/03/19 0821 10/04/19 0814  DDIMER 7.82* 9.40*  FERRITIN 1,453* 1,307*  CRP 1.4* 0.9    Lab Results  Component Value Date   SARSCOV2NAA POSITIVE (A) 09/29/2019   SARSCOV2NAA NEGATIVE 07/24/2019   SpO2: 97 %   Chronic A. fib with RVR: Initially treated with Cardizem drip.  Not an anticoagulation candidate. Patient on metoprolol at home that was continued.  Heart rate is a still suboptimally controlled.  Increased to  50 mg twice a day and he is tolerating it.   Acute metabolic encephalopathy: With some evidence of baseline dementia.  Improving.  Acute kidney injury on chronic kidney disease stage II: Renal functions improved.  Will discontinue IV fluids and monitor.  Encourage oral free water.    Goal of care/advance care planning: Patient remains quite debilitated with advanced medical conditions.  Debilitation worse with ongoing COVID-19 virus infection and multiple medical issues. Work with PT OT.  Short-term skilled nursing facility rehab and if no adequate improvement hospice at home.  DVT prophylaxis: SCDs Start: 09/30/19 1308   Code Status: DNR Family Communication: Patient's daughter on the phone 8/21. Disposition Plan: Status is: Inpatient  Remains inpatient appropriate because:IV treatments appropriate due to intensity of illness or inability to take PO   Dispo: The patient is from: Home              Anticipated d/c is to: Skilled nursing facility.  Anticipated d/c date is: When available.              Patient currently is medically stable.    Consultants:   Palliative care   Procedures:   None  Antimicrobials:  Antibiotics Given (last 72 hours)    Date/Time Action Medication Dose Rate   10/01/19 1933 New Bag/Given   cefTRIAXone (ROCEPHIN) 2 g in sodium chloride 0.9 % 100 mL IVPB 2 g 200 mL/hr   10/01/19 2234 New Bag/Given   azithromycin  (ZITHROMAX) 500 mg in sodium chloride 0.9 % 250 mL IVPB 500 mg 250 mL/hr   10/02/19 0846 New Bag/Given   remdesivir 100 mg in sodium chloride 0.9 % 100 mL IVPB 100 mg 200 mL/hr   10/03/19 1220 New Bag/Given   remdesivir 100 mg in sodium chloride 0.9 % 100 mL IVPB 100 mg 200 mL/hr         Subjective: Seen and examined.  No overnight events.  Pleasantly confused.  Unable to offer much complaints. Objective: Vitals:   10/03/19 1326 10/03/19 2000 10/04/19 0407 10/04/19 0842  BP: 97/68 106/84 104/69 111/79  Pulse: 94 90 75 (!) 102  Resp: 19 20 20  (!) 22  Temp: 98.5 F (36.9 C) 98 F (36.7 C) 98 F (36.7 C) (!) 96 F (35.6 C)  TempSrc: Oral Oral Axillary Axillary  SpO2: 100%   97%  Weight:      Height:        Intake/Output Summary (Last 24 hours) at 10/04/2019 1135 Last data filed at 10/04/2019 1115 Gross per 24 hour  Intake 4180.81 ml  Output 400 ml  Net 3780.81 ml   Filed Weights   10/01/19 1700  Weight: 69.1 kg    Examination:  General exam: Appears calm and comfortable , debilitated and chronically sick looking. confused and pleasant. Not in any distress.  On room air. Respiratory system: Clear to auscultation. Respiratory effort normal.  Some conducted airway sounds. Cardiovascular system: S1 & S2 heard, irregularly irregular.  No JVD, murmurs, rubs, gallops or clicks. No pedal edema. Left above-knee amputation stump clean and dry. Gastrointestinal system: Abdomen is nondistended, soft and nontender. No organomegaly or masses felt. Normal bowel sounds heard.     Data Reviewed: I have personally reviewed following labs and imaging studies  CBC: Recent Labs  Lab 09/29/19 1832 10/01/19 1033 10/03/19 0821 10/04/19 0814  WBC 9.3 6.2 7.1 7.5  NEUTROABS  --   --  5.8 6.1  HGB 15.2 12.1* 12.1* 11.6*  HCT 48.7 38.7* 38.0* 37.9*  MCV 86.8 86.2 85.0 86.5  PLT 79* 65* 65* 74*   Basic Metabolic Panel: Recent Labs  Lab 09/29/19 1832 09/30/19 0338 10/01/19 0816  10/03/19 0821 10/04/19 0814  NA 141 141 141 147* 147*  K 4.0 3.9 4.3 3.7 3.6  CL 107 109 110 116* 116*  CO2 19* 21* 22 20* 23  GLUCOSE 164* 133* 132* 116* 113*  BUN 50* 43* 40* 30* 26*  CREATININE 1.78* 1.35* 1.19 0.87 0.86  CALCIUM 8.1* 7.4* 7.4* 7.2* 7.2*  MG  --  1.6* 2.1 1.9 1.7  PHOS  --  3.2  --   --   --    GFR: Estimated Creatinine Clearance: 48.7 mL/min (by C-G formula based on SCr of 0.86 mg/dL). Liver Function Tests: Recent Labs  Lab 09/29/19 1832 09/30/19 0338 10/01/19 0816 10/03/19 0821 10/04/19 0814  AST 128*  --  102* 89* 66*  ALT 51*  --  43 60* 58*  ALKPHOS 55  --  41 53 61  BILITOT 0.9  --  0.6 0.6 0.5  PROT 6.9  --  5.4* 5.1* 5.0*  ALBUMIN 2.5* 2.0* 2.0* 1.9* 1.9*   No results for input(s): LIPASE, AMYLASE in the last 168 hours. No results for input(s): AMMONIA in the last 168 hours. Coagulation Profile: Recent Labs  Lab 09/29/19 2026  INR 1.2   Cardiac Enzymes: No results for input(s): CKTOTAL, CKMB, CKMBINDEX, TROPONINI in the last 168 hours. BNP (last 3 results) No results for input(s): PROBNP in the last 8760 hours. HbA1C: No results for input(s): HGBA1C in the last 72 hours. CBG: Recent Labs  Lab 09/29/19 2304  GLUCAP 127*   Lipid Profile: No results for input(s): CHOL, HDL, LDLCALC, TRIG, CHOLHDL, LDLDIRECT in the last 72 hours. Thyroid Function Tests: No results for input(s): TSH, T4TOTAL, FREET4, T3FREE, THYROIDAB in the last 72 hours. Anemia Panel: Recent Labs    10/03/19 0821 10/04/19 0814  FERRITIN 1,453* 1,307*   Sepsis Labs: Recent Labs  Lab 09/29/19 1836 09/30/19 0338  LATICACIDVEN 5.4* 2.3*    Recent Results (from the past 240 hour(s))  Blood Culture (routine x 2)     Status: Abnormal   Collection Time: 09/29/19  8:26 PM   Specimen: BLOOD  Result Value Ref Range Status   Specimen Description BLOOD RIGHT ANTECUBITAL  Final   Special Requests   Final    BOTTLES DRAWN AEROBIC AND ANAEROBIC Blood Culture  adequate volume   Culture  Setup Time   Final    GRAM POSITIVE COCCI IN CLUSTERS ANAEROBIC BOTTLE ONLY Organism ID to follow CRITICAL RESULT CALLED TO, READ BACK BY AND VERIFIED WITH: ANDREW MEYER PHARMD @1955  09/30/19 EB AEROBIC BOTTLE ONLY GRAM POSITIVE COCCI GRAM POSITIVE RODS CRITICAL RESULT CALLED TO, READ BACK BY AND VERIFIED WITH: J Dargan PHARMD 10/02/19 0050 JDW    Culture (A)  Final    STAPHYLOCOCCUS EPIDERMIDIS THE SIGNIFICANCE OF ISOLATING THIS ORGANISM FROM A SINGLE SET OF BLOOD CULTURES WHEN MULTIPLE SETS ARE DRAWN IS UNCERTAIN. PLEASE NOTIFY THE MICROBIOLOGY DEPARTMENT WITHIN ONE WEEK IF SPECIATION AND SENSITIVITIES ARE REQUIRED. Performed at [facility], [address] [place], [place]    Report Status 10/02/2019 FINAL  Final  SARS Coronavirus 2 by RT PCR (hospital order, performed in [facility] hospital lab) Nasopharyngeal Nasopharyngeal Swab     Status: Abnormal   Collection Time: 09/29/19  8:26 PM   Specimen: Nasopharyngeal Swab  Result Value Ref Range Status   SARS Coronavirus 2 POSITIVE (A) NEGATIVE Final    Comment: RESULT CALLED TO, READ BACK BY AND VERIFIED WITH: G KOPP RN 2231 09/29/19 A BROWNING (NOTE) SARS-CoV-2 target nucleic acids are DETECTED  SARS-CoV-2 RNA is generally detectable in upper respiratory specimens  during the acute phase of infection.  Positive results are indicative  of the presence of the identified virus, but do not rule out bacterial infection or co-infection with other pathogens not detected by the test.  Clinical correlation with patient history and  other diagnostic information is necessary to determine patient infection status.  The expected result is negative.  Fact Sheet for Patients:   10/01/19   Fact Sheet for Healthcare Providers:   BoilerBrush.com.cy    This test is not yet approved or cleared by the https://pope.com/ FDA and  has been authorized for  detection and/or diagnosis of SARS-CoV-2 by FDA under an Emergency Use Authorization (EUA).  This EUA will remain in effect (meaning  this tes t can be used) for the duration of  the COVID-19 declaration under Section 564(b)(1) of the Act, 21 U.S.C. section 360-bbb-3(b)(1), unless the authorization is terminated or revoked sooner.  Performed at [facility], [address] [place], [place] [address]   Blood Culture ID Panel (Reflexed)     Status: Abnormal   Collection Time: 09/29/19  8:26 PM  Result Value Ref Range Status   Enterococcus faecalis NOT DETECTED NOT DETECTED Final   Enterococcus Faecium NOT DETECTED NOT DETECTED Final   Listeria monocytogenes NOT DETECTED NOT DETECTED Final   Staphylococcus species DETECTED (A) NOT DETECTED Final    Comment: RESULT CALLED TO, READ BACK BY AND VERIFIED WITH: Harland GermanNDREW MEYER PHARMD @1955  09/30/19 EB    Staphylococcus aureus (BCID) NOT DETECTED NOT DETECTED Final   Staphylococcus epidermidis DETECTED (A) NOT DETECTED Final    Comment: Methicillin (oxacillin) resistant coagulase negative staphylococcus. Possible blood culture contaminant (unless isolated from more than one blood culture draw or clinical case suggests pathogenicity). No antibiotic treatment is indicated for blood  culture contaminants. RESULT CALLED TO, READ BACK BY AND VERIFIED WITH: ANDREW MEYER PHARMD @1955  09/30/19 EB    Staphylococcus lugdunensis NOT DETECTED NOT DETECTED Final   Streptococcus species NOT DETECTED NOT DETECTED Final   Streptococcus agalactiae NOT DETECTED NOT DETECTED Final   Streptococcus pneumoniae NOT DETECTED NOT DETECTED Final   Streptococcus pyogenes NOT DETECTED NOT DETECTED Final   A.calcoaceticus-baumannii NOT DETECTED NOT DETECTED Final   Bacteroides fragilis NOT DETECTED NOT DETECTED Final   Enterobacterales NOT DETECTED NOT DETECTED Final   Enterobacter cloacae complex NOT DETECTED NOT DETECTED Final   Escherichia coli NOT DETECTED NOT  DETECTED Final   Klebsiella aerogenes NOT DETECTED NOT DETECTED Final   Klebsiella oxytoca NOT DETECTED NOT DETECTED Final   Klebsiella pneumoniae NOT DETECTED NOT DETECTED Final   Proteus species NOT DETECTED NOT DETECTED Final   Salmonella species NOT DETECTED NOT DETECTED Final   Serratia marcescens NOT DETECTED NOT DETECTED Final   Haemophilus influenzae NOT DETECTED NOT DETECTED Final   Neisseria meningitidis NOT DETECTED NOT DETECTED Final   Pseudomonas aeruginosa NOT DETECTED NOT DETECTED Final   Stenotrophomonas maltophilia NOT DETECTED NOT DETECTED Final   Candida albicans NOT DETECTED NOT DETECTED Final   Candida auris NOT DETECTED NOT DETECTED Final   Candida glabrata NOT DETECTED NOT DETECTED Final   Candida krusei NOT DETECTED NOT DETECTED Final   Candida parapsilosis NOT DETECTED NOT DETECTED Final   Candida tropicalis NOT DETECTED NOT DETECTED Final   Cryptococcus neoformans/gattii NOT DETECTED NOT DETECTED Final   Methicillin resistance mecA/C DETECTED (A) NOT DETECTED Final    Comment: RESULT CALLED TO, READ BACK BY AND VERIFIED WITH: Harland GermanNDREW MEYER PHARMD @1955  09/30/19 EB Performed at [facility], [address] [place], [place] [address]   Blood Culture (routine x 2)     Status: None   Collection Time: 09/29/19  8:50 PM   Specimen: BLOOD RIGHT HAND  Result Value Ref Range Status   Specimen Description BLOOD RIGHT HAND  Final   Special Requests   Final    BOTTLES DRAWN AEROBIC ONLY Blood Culture results may not be optimal due to an inadequate volume of blood received in culture bottles   Culture   Final    NO GROWTH 5 DAYS Performed at [facility], [address] [place], [place] [address]    Report Status 10/04/2019 FINAL  Final  Urine culture  Status: Abnormal   Collection Time: 09/30/19 11:24 PM   Specimen: In/Out Cath Urine  Result Value Ref Range Status   Specimen Description IN/OUT CATH URINE  Final   Special Requests   Final     NONE Performed at [facility], [address], [place], [place] [address]    Culture MULTIPLE SPECIES PRESENT, SUGGEST RECOLLECTION (A)  Final   Report Status 10/04/2019 FINAL  Final         Radiology Studies: No results found.      Scheduled Meds: . albuterol  2 puff Inhalation Q6H  . vitamin C  500 mg Oral Daily  . dexamethasone (DECADRON) injection  6 mg Intravenous Q24H  . metoprolol tartrate  50 mg Oral BID  . zinc sulfate  220 mg Oral Daily   Continuous Infusions:    LOS: 5 days    Time spent: 30 minutes    Dorcas Carrow, MD [facility] Pager [phone]

## 2019-10-05 DIAGNOSIS — L8990 Pressure ulcer of unspecified site, unspecified stage: Secondary | ICD-10-CM | POA: Insufficient documentation

## 2019-10-05 LAB — CBC WITH DIFFERENTIAL/PLATELET
Abs Immature Granulocytes: 0.27 10*3/uL — ABNORMAL HIGH (ref 0.00–0.07)
Basophils Absolute: 0.1 10*3/uL (ref 0.0–0.1)
Basophils Relative: 1 %
Eosinophils Absolute: 0 10*3/uL (ref 0.0–0.5)
Eosinophils Relative: 0 %
HCT: 37.3 % — ABNORMAL LOW (ref 39.0–52.0)
Hemoglobin: 12.2 g/dL — ABNORMAL LOW (ref 13.0–17.0)
Immature Granulocytes: 3 %
Lymphocytes Relative: 7 %
Lymphs Abs: 0.6 10*3/uL — ABNORMAL LOW (ref 0.7–4.0)
MCH: 28 pg (ref 26.0–34.0)
MCHC: 32.7 g/dL (ref 30.0–36.0)
MCV: 85.6 fL (ref 80.0–100.0)
Monocytes Absolute: 0.7 10*3/uL (ref 0.1–1.0)
Monocytes Relative: 8 %
Neutro Abs: 7 10*3/uL (ref 1.7–7.7)
Neutrophils Relative %: 81 %
Platelets: 71 10*3/uL — ABNORMAL LOW (ref 150–400)
RBC: 4.36 MIL/uL (ref 4.22–5.81)
RDW: 16.6 % — ABNORMAL HIGH (ref 11.5–15.5)
WBC: 8.5 10*3/uL (ref 4.0–10.5)
nRBC: 0 % (ref 0.0–0.2)

## 2019-10-05 LAB — COMPREHENSIVE METABOLIC PANEL
ALT: 58 U/L — ABNORMAL HIGH (ref 0–44)
AST: 57 U/L — ABNORMAL HIGH (ref 15–41)
Albumin: 2 g/dL — ABNORMAL LOW (ref 3.5–5.0)
Alkaline Phosphatase: 62 U/L (ref 38–126)
Anion gap: 7 (ref 5–15)
BUN: 27 mg/dL — ABNORMAL HIGH (ref 8–23)
CO2: 22 mmol/L (ref 22–32)
Calcium: 7.4 mg/dL — ABNORMAL LOW (ref 8.9–10.3)
Chloride: 119 mmol/L — ABNORMAL HIGH (ref 98–111)
Creatinine, Ser: 0.87 mg/dL (ref 0.61–1.24)
GFR calc Af Amer: >60 mL/min (ref >60)
GFR calc non Af Amer: >60 mL/min (ref >60)
Glucose, Bld: 126 mg/dL — ABNORMAL HIGH (ref 70–99)
Potassium: 3.8 mmol/L (ref 3.5–5.1)
Sodium: 148 mmol/L — ABNORMAL HIGH (ref 135–145)
Total Bilirubin: 0.4 mg/dL (ref 0.3–1.2)
Total Protein: 4.9 g/dL — ABNORMAL LOW (ref 6.5–8.1)

## 2019-10-05 LAB — C-REACTIVE PROTEIN: CRP: 0.7 mg/dL (ref <1.0)

## 2019-10-05 LAB — FERRITIN: Ferritin: 1354 ng/mL — ABNORMAL HIGH (ref 24–336)

## 2019-10-05 LAB — MAGNESIUM: Magnesium: 1.7 mg/dL (ref 1.7–2.4)

## 2019-10-05 LAB — D-DIMER, QUANTITATIVE: D-Dimer, Quant: 10.13 ug/mL-FEU — ABNORMAL HIGH (ref 0.00–0.50)

## 2019-10-05 MED ORDER — RESOURCE THICKENUP CLEAR PO POWD
ORAL | Status: DC | PRN
  Filled 2019-10-05: qty 125

## 2019-10-05 MED ORDER — LORAZEPAM 2 MG/ML IJ SOLN: 0.5000 mg | INTRAMUSCULAR | Status: DC | PRN

## 2019-10-05 MED ORDER — DEXTROSE-NACL 5-0.45 % IV SOLN: INTRAVENOUS | Status: DC

## 2019-10-05 MED ORDER — ORAL CARE MOUTH RINSE
15.0000 mL | Freq: Two times a day (BID) | OROMUCOSAL | Status: DC
  Administered 2019-10-05: 15 mL via OROMUCOSAL

## 2019-10-05 MED ORDER — MORPHINE SULFATE (CONCENTRATE) 10 MG/0.5ML PO SOLN: 10.0000 mg | ORAL | Status: DC | PRN

## 2019-10-05 NOTE — Progress Notes (Signed)
Discussed with Dr. Jerral Ralph in personal and updated him about pt's progress. Informed MD that pt is unable to eat or drink without aspirating and pt's failure to thrive. MD stated new orders will follow.

## 2019-10-05 NOTE — Evaluation (Signed)
Clinical/Bedside Swallow Evaluation Patient Details  Name: [name] [name] MRN: [id number] Date of Birth: 05-Jun-1926  Today's Date: 10/05/2019 Time: SLP Start Time (ACUTE ONLY): 1119 SLP Stop Time (ACUTE ONLY): 1132 SLP Time Calculation (min) (ACUTE ONLY): 13 min  Past Medical History:  Past Medical History:  Diagnosis Date  . A-fib (HCC)   . Cerebral aneurysm    history of cerebral hemorrhage due to brain aneurysm rupture 25 yrs ago  . Complication of anesthesia    had Atrial Fib.- per patient  . DJD (degenerative joint disease)   . DVT (deep venous thrombosis) (HCC)    history of prior to amputation  . Dysrhythmia    PAF  . History of kidney stones    passed all except 1.  . HOH (hard of hearing)   . Peripheral arterial disease (HCC)   . Stroke [name] [name])    patient unaware  . Urethral stricture    history of dilated   Past Surgical History:  Past Surgical History:  Procedure Laterality Date  . ABOVE KNEE LEG AMPUTATION     left  . APPENDECTOMY    . CYST EXCISION     above rectum  . CYSTOSCOPY     kidney stone  . HERNIA REPAIR Left   . LUMBAR WOUND DEBRIDEMENT N/A 06/22/2013   Procedure: incision and drainage of posterior cervical wound;  Surgeon: Cristi Loron, MD;  Location: [facility];  Service: Neurosurgery;  Laterality: N/A;  . PROSTATECTOMY    . SUBOCCIPITAL CRANIECTOMY CERVICAL LAMINECTOMY Left 05/13/2013   Procedure: Left SUBOCCIPITAL CRANIECTOMY;  Surgeon: Cristi Loron, MD;  Location: [facility];  Service: Neurosurgery;  Laterality: Left;  . TONSILLECTOMY AND ADENOIDECTOMY    . VARICOSE VEIN SURGERY     HPI:  [name] [name] is a 86 y.o. male with medical history significant of atrial fibrillation, DVT, cerebral aneurysm, previous intracranial bleeds,  degenerative joint disease, dysrhythmia who presented to the ER with a fever, cough and altered mental status. + Covid. Currently in Pallliative care . RN documentation notes pt appears to be aspirating with all  po's. CXR no active disease. BSE 6 yrs ago after coughing episode. Pt stated he was using straw when coughing. Recommended reg/thin.    Assessment / Plan / Recommendation Clinical Impression  Pt had difficulty following commands. No labored respirations or pharyngeal congestion noted prior to po's. However, following thin liquid there was a 5-8 second delayed cough. Honey thick resulted in mild delayed cough x 1. Prolonged mastication with solids in addition to missing (majority) and poor dentition. He will need full assist with meals for feeding and ensure safe positioning as he demonstrates a significant lean to his left. Pt diagnosed with failure to thrive and was followed by Palliative care- per recent social worker note pt will be transferred to hospice services. If it is desired for pt to consume thin liquids and attempt more solid food this is an option. ST will follow while in acute care.      SLP Visit Diagnosis: Dysphagia, unspecified (R13.10)    Aspiration Risk  Moderate aspiration risk    Diet Recommendation Honey-thick liquid;Dysphagia 1 (Puree)   Liquid Administration via: Cup Medication Administration: Crushed with puree Supervision: Staff to assist with self feeding;Full supervision/cueing for compensatory strategies Compensations: Slow rate;Small sips/bites;Minimize environmental distractions;Lingual sweep for clearance of pocketing;Clear throat intermittently Postural Changes: Seated upright at 90 degrees    Other  Recommendations Oral Care Recommendations: Oral care BID   Follow up  Recommendations None      Frequency and Duration min 1 x/week  2 weeks       Prognosis Prognosis for Safe Diet Advancement:  (guarded-fair) Barriers to Reach Goals: Cognitive deficits      Swallow Study   General HPI: [name] [name] is a 86 y.o. male with medical history significant of atrial fibrillation, DVT, cerebral aneurysm, previous intracranial bleeds,  degenerative joint disease,  dysrhythmia who presented to the ER with a fever, cough and altered mental status. + Covid. Currently in Pallliative care . RN documentation notes pt appears to be aspirating with all po's. CXR no active disease. BSE 6 yrs ago after coughing episode. Pt stated he was using straw when coughing. Recommended reg/thin.  Type of Study: Bedside Swallow Evaluation Previous Swallow Assessment:  (see impressions) Diet Prior to this Study: Regular;Thin liquids Temperature Spikes Noted: No Respiratory Status: Room air History of Recent Intubation: No Behavior/Cognition: Alert;Cooperative;Confused;Requires cueing Oral Cavity Assessment: Dried secretions Oral Care Completed by SLP: Recent completion by staff Oral Cavity - Dentition: Poor condition;Missing dentition Vision: Functional for self-feeding Self-Feeding Abilities: Needs assist Patient Positioning: Upright in bed Baseline Vocal Quality: Normal Volitional Cough: Cognitively unable to elicit Volitional Swallow: Unable to elicit    Oral/Motor/Sensory Function Overall Oral Motor/Sensory Function: Within functional limits   Ice Chips Ice chips: Not tested   Thin Liquid Thin Liquid: Impaired Presentation: Cup Oral Phase Impairments: Reduced labial seal Oral Phase Functional Implications: Left anterior spillage Pharyngeal  Phase Impairments: Cough - Delayed    Nectar Thick Nectar Thick Liquid: Not tested   Honey Thick Honey Thick Liquid: Impaired Presentation: Cup Oral Phase Impairments: Reduced labial seal;Reduced lingual movement/coordination Pharyngeal Phase Impairments: Throat Clearing - Delayed   Puree Puree: Impaired Oral Phase Impairments: Reduced lingual movement/coordination;Reduced labial seal   Solid     Solid: Impaired Oral Phase Impairments: Reduced lingual movement/coordination Oral Phase Functional Implications: Prolonged oral transit      [name] [name] 10/05/2019,11:50 AM  [name] [name].Ed  Nurse, children's [phone] Office [phone]

## 2019-10-05 NOTE — Progress Notes (Signed)
Physical Therapy Treatment Patient Details Name: [name] [name] MRN: [id number] DOB: [birth year] Today's Date: 10/05/2019    History of Present Illness 86 yo male presenting with ED Afib RVR and confusion. Testing COVID positive. PMH including LT AKA,  paroxysmal A. fib, cerebral aneurysm, DVT, and nephrolithiasis.    PT Comments    Patient better able to participate compared to on date of PT evaluation. He requires incr time and multi-modal cues for mobility. Required +2 assist to come to sit at EOB, however he tolerated sitting EOB ~15 minutes with minguard-min assist, however required return to supine due to elevating HR (HRmax 166 bpm). Sats WNL on RA.     Follow Up Recommendations  SNF (?family choosing to use Hospice (unclear))     Equipment Recommendations  None recommended by PT    Recommendations for Other Services       Precautions / Restrictions Precautions Precautions: Fall    Mobility  Bed Mobility Overal bed mobility: Needs Assistance Bed Mobility: Rolling;Supine to Sit;Sit to Supine Rolling: Mod assist   Supine to sit: Max assist;+2 for physical assistance Sit to supine: Mod assist;+2 for physical assistance   General bed mobility comments: Came to EOB and achieved balance better with one person in front and one person behind to limit posterior lean  Transfers                 General transfer comment: Unable to attempt due to elevated HR  Ambulation/Gait             General Gait Details: Pt non ambulatory   Stairs             Wheelchair Mobility    Modified Rankin (Stroke Patients Only)       Balance Overall balance assessment: Needs assistance Sitting-balance support: Feet supported;No upper extremity supported Sitting balance-Leahy Scale: Poor Sitting balance - Comments: minguard assist for static sitting; pt reaching outside BOS and requires min assist to maintain balance Postural control: Left lateral lean (initially,  progressed to midline)                                  Cognition Arousal/Alertness: Awake/alert Behavior During Therapy: Flat affect Overall Cognitive Status: No family/caregiver present to determine baseline cognitive functioning                                 General Comments: following simple commands with incr time and multi-modal cues;       Exercises      General Comments General comments (skin integrity, edema, etc.): HR max 166 (irregular; mostly 120s-130s); sats not registering well, on return to supine moved probe to his ear with sats 99% on RA      Pertinent Vitals/Pain Pain Assessment: Faces Faces Pain Scale: Hurts even more Pain Location: scrotum (+wound/bandage) Pain Descriptors / Indicators: Grimacing Pain Intervention(s): Monitored during session;Limited activity within patient's tolerance;Repositioned    Home Living                      Prior Function            PT Goals (current goals can now be found in the care plan section) Acute Rehab PT Goals Patient Stated Goal: Unstated Time For Goal Achievement: 10/16/19 Potential to Achieve Goals: Fair Progress towards PT goals: Progressing toward goals  Frequency    Min 2X/week      PT Plan Current plan remains appropriate    Co-evaluation PT/OT/SLP Co-Evaluation/Treatment: Yes Reason for Co-Treatment: Complexity of the patient's impairments (multi-system involvement);For patient/therapist safety;Necessary to address cognition/behavior during functional activity PT goals addressed during session: Mobility/safety with mobility;Balance        AM-PAC PT "6 Clicks" Mobility   Outcome Measure  Help needed turning from your back to your side while in a flat bed without using bedrails?: A Lot Help needed moving from lying on your back to sitting on the side of a flat bed without using bedrails?: A Lot Help needed moving to and from a bed to a chair (including a  wheelchair)?: Total Help needed standing up from a chair using your arms (e.g., wheelchair or bedside chair)?: Total Help needed to walk in hospital room?: Total Help needed climbing 3-5 steps with a railing? : Total 6 Click Score: 8    End of Session   Activity Tolerance: Treatment limited secondary to medical complications (Comment) (elevated HR) Patient left: in bed;with call bell/phone within reach;with bed alarm set   PT Visit Diagnosis: Other abnormalities of gait and mobility (R26.89)     Time: 1914-7829 PT Time Calculation (min) (ACUTE ONLY): 23 min  Charges:  $Therapeutic Activity: 8-22 mins                      Jerolyn Center, PT Pager [phone]    Zena Amos 10/05/2019, 1:34 PM

## 2019-10-05 NOTE — Progress Notes (Signed)
Occupational Therapy Treatment Patient Details Name: [name] [name] MRN: [id number] DOB: [birth year] Today's Date: 10/05/2019    History of present illness 86 yo male presenting with ED Afib RVR and confusion. Testing COVID positive. PMH including LT AKA,  paroxysmal A. fib, cerebral aneurysm, DVT, and nephrolithiasis.   OT comments  Pt progressing towards established OT goals. Continues to present with decreased activity tolerance and fatigues quickly. Pt requiring Max A +2 for bed mobility and to gain balance at EOB. Pt tolerating sitting at EOB with Min A for ~15 minutes. Returning to supine as pt HR elevating to 130s (max 166 bpm). SpO2 90 on RA. Continue to recommend post-acute rehab pending family's decision for hospice.    Follow Up Recommendations  Supervision/Assistance - 24 hour;Other (comment) (Hospice)    Equipment Recommendations  Other (comment) (Defer to next venue)    Recommendations for Other Services      Precautions / Restrictions Precautions Precautions: Fall       Mobility Bed Mobility Overal bed mobility: Needs Assistance Bed Mobility: Rolling;Supine to Sit;Sit to Supine Rolling: Mod assist   Supine to sit: Max assist;+2 for physical assistance Sit to supine: Mod assist;+2 for physical assistance   General bed mobility comments: Came to EOB and achieved balance better with one person in front and one person behind to limit posterior lean  Transfers                 General transfer comment: Unable to attempt due to elevated HR    Balance Overall balance assessment: Needs assistance Sitting-balance support: Feet supported;No upper extremity supported Sitting balance-Leahy Scale: Poor Sitting balance - Comments: minguard assist for static sitting; pt reaching outside BOS and requires min assist to maintain balance Postural control: Left lateral lean (initially, progressed to midline)                                 ADL either  performed or assessed with clinical judgement   ADL Overall ADL's : Needs assistance/impaired     Grooming: Wash/dry face;Minimal assistance;Bed level Grooming Details (indicate cue type and reason): Cues to wash face, pt bringing wash clothe to mouth and wiping mouth. quickly loosing attention to task and placing wash cloth down                               General ADL Comments: Pt tolerating sitting at EOB with Min A for support. HR elevating and returning to supine     Vision       Perception     Praxis      Cognition Arousal/Alertness: Awake/alert Behavior During Therapy: Flat affect Overall Cognitive Status: No family/caregiver present to determine baseline cognitive functioning                                 General Comments: following simple commands with incr time and multi-modal cues;         Exercises     Shoulder Instructions       General Comments HR max 166 (irregular; mostly 120s-130s); sats not registering well, on return to supine moved probe to his ear with sats 99% on RA    Pertinent Vitals/ Pain       Pain Assessment: Faces Faces Pain Scale: Hurts even more Pain Location: scrotum (+  wound/bandage) Pain Descriptors / Indicators: Grimacing Pain Intervention(s): Monitored during session;Limited activity within patient's tolerance;Repositioned  Home Living                                          Prior Functioning/Environment              Frequency  Min 1X/week        Progress Toward Goals  OT Goals(current goals can now be found in the care plan section)  Progress towards OT goals: Progressing toward goals  Acute Rehab OT Goals Patient Stated Goal: Unstated OT Goal Formulation: Patient unable to participate in goal setting Time For Goal Achievement: 10/16/19 Potential to Achieve Goals: Fair ADL Goals Pt Will Perform Grooming: with min assist;sitting Pt Will Transfer to Toilet: with  mod assist;stand pivot transfer;bedside commode Additional ADL Goal #1: Pt will perform bed mobility with Min A in preparation for ADLs Additional ADL Goal #2: Pt will follow one step commands 75% of time during ADLs  Plan Discharge plan remains appropriate    Co-evaluation    PT/OT/SLP Co-Evaluation/Treatment: Yes Reason for Co-Treatment: For patient/therapist safety;To address functional/ADL transfers;Complexity of the patient's impairments (multi-system involvement) PT goals addressed during session: Mobility/safety with mobility;Balance OT goals addressed during session: ADL's and self-care      AM-PAC OT "6 Clicks" Daily Activity     Outcome Measure   Help from another person eating meals?: A Lot Help from another person taking care of personal grooming?: A Lot Help from another person toileting, which includes using toliet, bedpan, or urinal?: Total Help from another person bathing (including washing, rinsing, drying)?: A Lot Help from another person to put on and taking off regular upper body clothing?: A Lot Help from another person to put on and taking off regular lower body clothing?: Total 6 Click Score: 10    End of Session    OT Visit Diagnosis: Unsteadiness on feet (R26.81);Other abnormalities of gait and mobility (R26.89);Muscle weakness (generalized) (M62.81)   Activity Tolerance Patient limited by fatigue   Patient Left in bed;with call bell/phone within reach;with bed alarm set   Nurse Communication Mobility status        Time: 0350-0938 OT Time Calculation (min): 26 min  Charges: OT General Charges $OT Visit: 1 Visit OT Treatments $Self Care/Home Management : 8-22 mins  [name] [name] MSOT, OTR/L Acute Rehab Pager: [phone] Office: [phone]   [name] [name] 10/05/2019, 1:51 PM

## 2019-10-05 NOTE — Progress Notes (Signed)
PROGRESS NOTE    [name] [name]  ZMO:[id number] DOB: October 19, 1926 DOA: 09/29/2019 PCP: Elias Else, MD    Brief Narrative:  86 year old male with history of paroxysmal A. fib on metoprolol, not on anticoagulation because of previous severe bleeding, history of DVT presented to the emergency room for on and off confusion.  Lives at home with daughter.  He was acting off as per the family.  When EMS arrived at home, he was found with A. fib with RVR with heart rate 180.  They started patient on 20 mg of bolus Cardizem and IV fluids and brought to the ER.  In the emergency room, temperature 100, blood pressure 84/65, respiratory 32.  94% on room air.  AKI.  Mildly elevated transaminases.  Chest x-ray normal.  Head CT normal.  COVID-19 positive.  EKG with rapid A. fib.  Lactic acid 5.4. Patient was treated symptomatically with IV fluids and steroids.  With remdesivir because of need for hospitalization. Patient continued to remain severely debilitated, now failure to thrive and not having adequate intake. Followed by palliative care. 8/23: Refer to hospice.   Assessment & Plan:   Principal Problem:   COVID-19 virus infection Active Problems:   PVD (peripheral vascular disease) (HCC)   Atrial fibrillation with RVR (HCC)   History of DVT (deep vein thrombosis)   AKI (acute kidney injury) (HCC)   Thrombocytopenia (HCC)   Atrial fibrillation with rapid ventricular response (HCC)   Palliative care by specialist   Goals of care, counseling/discussion   DNR (do not resuscitate)   DNI (do not intubate)   Sepsis with acute renal failure and septic shock (HCC)   Weakness   Encounter for hospice care discussion   Pressure injury of skin  Sepsis present on admission with lactic acidosis, hypotension in the setting of COVID-19 viral infection, severe dehydration.   Patient is mostly on room air.  No pulmonary symptoms. chest physiotherapy, incentive spirometry, deep breathing exercises, sputum  induction, mucolytic's and bronchodilators as much patient tolerates. Supplemental oxygen to keep saturations more than 90%. Covid directed therapy with , steroids dexamethasone daily remdesivir day 5/5 Antibiotics were discontinued.  Does not show any evidence of active infection. Blood cultures positive for Staph epidermidis, probably contaminant.  COVID-19 Labs  Recent Labs    10/03/19 0821 10/04/19 0814 10/05/19 0421  DDIMER 7.82* 9.40* 10.13*  FERRITIN 1,453* 1,307* 1,354*  CRP 1.4* 0.9 0.7    Lab Results  Component Value Date   SARSCOV2NAA POSITIVE (A) 09/29/2019   SARSCOV2NAA NEGATIVE 07/24/2019   SpO2: 96 %   Chronic A. fib with RVR: Initially treated with Cardizem drip.  Not an anticoagulation candidate. Patient on metoprolol at home that was continued.  Heart rate is a still suboptimally controlled.  Increased to  50 mg twice a day and he is tolerating it.   Acute metabolic encephalopathy: With some evidence of baseline dementia.   Continues to have fluctuating mental status. Developed severe debility with chronic medical issues and COVID-19 infection.  Acute kidney injury on chronic kidney disease stage II: Renal function stabilized.  Continues to have hyponatremia.  Free water deficit. Not eating or drinking adequately. Referral to hospice.  Goal of care/advance care planning: Patient remains quite debilitated with advanced medical conditions.  Debilitation worse with ongoing COVID-19 virus infection and multiple medical issues. Not sure if he will be able to participate on PT OT. Goal of care discussion done with daughter. He is appropriate for inpatient hospice or hospice at home.  We will refer to hospice programs for inpatient hospice.  DVT prophylaxis: SCDs Start: 09/30/19 [id number]   Code Status: DNR Family Communication: Patient's daughter on the phone. Disposition Plan: Status is: Inpatient  Remains inpatient appropriate because:IV treatments  appropriate due to intensity of illness or inability to take PO   Dispo: The patient is from: Home              Anticipated d/c is to: Skilled nursing facility with hospice versus hospice home.              Anticipated d/c date is: When available.              Patient currently is medically stable only to transfer to hospice level of care.    Consultants:   Palliative care   Procedures:   None  Antimicrobials:  Antibiotics Given (last 72 hours)    Date/Time Action Medication Dose Rate   10/03/19 1220 New Bag/Given   remdesivir 100 mg in sodium chloride 0.9 % 100 mL IVPB 100 mg 200 mL/hr         Subjective: Seen and examined.  Intermittent confusion.  Complains of being very thirsty.  Wants water.  Nursing reported that he was not able to eat or drink anything. Objective: Vitals:   10/04/19 1633 10/04/19 2000 10/05/19 0511 10/05/19 1115  BP: (!) 98/47 (!) 101/51 (!) 108/93 (!) 111/57  Pulse: 74 83 85 (!) 101  Resp:  (!) 21 20 20   Temp:  97.9 F (36.6 C) 97.6 F (36.4 C) 97.7 F (36.5 C)  TempSrc:  Axillary Axillary Axillary  SpO2: 97% 94% 99% 96%  Weight:      Height:        Intake/Output Summary (Last 24 hours) at 10/05/2019 1245 Last data filed at 10/05/2019 0944 Gross per 24 hour  Intake 276.68 ml  Output 250 ml  Net 26.68 ml   Filed Weights   10/01/19 1700  Weight: 69.1 kg    Examination:  General exam: Appears anxious, sick looking.  Debilitated. confused and pleasant. Not in any distress.  On room air. Respiratory system: Clear to auscultation. Respiratory effort normal.  Some conducted airway sounds. Cardiovascular system: S1 & S2 heard, irregularly irregular.  No JVD, murmurs, rubs, gallops or clicks. No pedal edema. Left above-knee amputation stump clean and dry. Gastrointestinal system: Abdomen is nondistended, soft and nontender. No organomegaly or masses felt. Normal bowel sounds heard.     Data Reviewed: I have personally reviewed  following labs and imaging studies  CBC: Recent Labs  Lab 09/29/19 1832 10/01/19 1033 10/03/19 0821 10/04/19 0814 10/05/19 0421  WBC 9.3 6.2 7.1 7.5 8.5  NEUTROABS  --   --  5.8 6.1 7.0  HGB 15.2 12.1* 12.1* 11.6* 12.2*  HCT 48.7 38.7* 38.0* 37.9* 37.3*  MCV 86.8 86.2 85.0 86.5 85.6  PLT 79* 65* 65* 74* 71*   Basic Metabolic Panel: Recent Labs  Lab 09/30/19 0338 10/01/19 0816 10/03/19 0821 10/04/19 0814 10/05/19 0421  NA 141 141 147* 147* 148*  K 3.9 4.3 3.7 3.6 3.8  CL 109 110 116* 116* 119*  CO2 21* 22 20* 23 22  GLUCOSE 133* 132* 116* 113* 126*  BUN 43* 40* 30* 26* 27*  CREATININE 1.35* 1.19 0.87 0.86 0.87  CALCIUM 7.4* 7.4* 7.2* 7.2* 7.4*  MG 1.6* 2.1 1.9 1.7 1.7  PHOS 3.2  --   --   --   --    GFR: Estimated Creatinine Clearance:  48.1 mL/min (by C-G formula based on SCr of 0.87 mg/dL). Liver Function Tests: Recent Labs  Lab 09/29/19 1832 09/29/19 1832 09/30/19 0338 10/01/19 0816 10/03/19 0821 10/04/19 0814 10/05/19 0421  AST 128*  --   --  102* 89* 66* 57*  ALT 51*  --   --  43 60* 58* 58*  ALKPHOS 55  --   --  41 53 61 62  BILITOT 0.9  --   --  0.6 0.6 0.5 0.4  PROT 6.9  --   --  5.4* 5.1* 5.0* 4.9*  ALBUMIN 2.5*   < > 2.0* 2.0* 1.9* 1.9* 2.0*   < > = values in this interval not displayed.   No results for input(s): LIPASE, AMYLASE in the last 168 hours. No results for input(s): AMMONIA in the last 168 hours. Coagulation Profile: Recent Labs  Lab 09/29/19 2026  INR 1.2   Cardiac Enzymes: No results for input(s): CKTOTAL, CKMB, CKMBINDEX, TROPONINI in the last 168 hours. BNP (last 3 results) No results for input(s): PROBNP in the last 8760 hours. HbA1C: No results for input(s): HGBA1C in the last 72 hours. CBG: Recent Labs  Lab 09/29/19 2304  GLUCAP 127*   Lipid Profile: No results for input(s): CHOL, HDL, LDLCALC, TRIG, CHOLHDL, LDLDIRECT in the last 72 hours. Thyroid Function Tests: No results for input(s): TSH, T4TOTAL, FREET4,  T3FREE, THYROIDAB in the last 72 hours. Anemia Panel: Recent Labs    10/04/19 0814 10/05/19 0421  FERRITIN 1,307* 1,354*   Sepsis Labs: Recent Labs  Lab 09/29/19 1836 09/30/19 0338  LATICACIDVEN 5.4* 2.3*    Recent Results (from the past 240 hour(s))  Blood Culture (routine x 2)     Status: Abnormal   Collection Time: 09/29/19  8:26 PM   Specimen: BLOOD  Result Value Ref Range Status   Specimen Description BLOOD RIGHT ANTECUBITAL  Final   Special Requests   Final    BOTTLES DRAWN AEROBIC AND ANAEROBIC Blood Culture adequate volume   Culture  Setup Time   Final    GRAM POSITIVE COCCI IN CLUSTERS ANAEROBIC BOTTLE ONLY Organism ID to follow CRITICAL RESULT CALLED TO, READ BACK BY AND VERIFIED WITH: ANDREW MEYER PHARMD  09/30/19 EB AEROBIC BOTTLE ONLY GRAM POSITIVE COCCI GRAM POSITIVE RODS CRITICAL RESULT CALLED TO, READ BACK BY AND VERIFIED WITH: J Summerfield PHARMD 10/02/19 0050 JDW    Culture (A)  Final    STAPHYLOCOCCUS EPIDERMIDIS THE SIGNIFICANCE OF ISOLATING THIS ORGANISM FROM A SINGLE SET OF BLOOD CULTURES WHEN MULTIPLE SETS ARE DRAWN IS UNCERTAIN. PLEASE NOTIFY THE MICROBIOLOGY DEPARTMENT WITHIN ONE WEEK IF SPECIATION AND SENSITIVITIES ARE REQUIRED. Performed at [facility], [address] [place], [place] [address]    Report Status 10/02/2019 FINAL  Final  SARS Coronavirus 2 by RT PCR (hospital order, performed in [facility] hospital lab) Nasopharyngeal Nasopharyngeal Swab     Status: Abnormal   Collection Time: 09/29/19  8:26 PM   Specimen: Nasopharyngeal Swab  Result Value Ref Range Status   SARS Coronavirus 2 POSITIVE (A) NEGATIVE Final    Comment: RESULT CALLED TO, READ BACK BY AND VERIFIED WITH: G KOPP RN 2231 09/29/19 A BROWNING (NOTE) SARS-CoV-2 target nucleic acids are DETECTED  SARS-CoV-2 RNA is generally detectable in upper respiratory specimens  during the acute phase of infection.  Positive results are indicative  of the presence of the  identified virus, but do not rule out bacterial infection or co-infection with other pathogens not detected by  the test.  Clinical correlation with patient history and  other diagnostic information is necessary to determine patient infection status.  The expected result is negative.  Fact Sheet for Patients:   BoilerBrush.com.cy   Fact Sheet for Healthcare Providers:   https://pope.com/    This test is not yet approved or cleared by the [place] FDA and  has been authorized for detection and/or diagnosis of SARS-CoV-2 by FDA under an Emergency Use Authorization (EUA).  This EUA will remain in effect (meaning this tes t can be used) for the duration of  the COVID-19 declaration under Section 564(b)(1) of the Act, 21 U.S.C. section 360-bbb-3(b)(1), unless the authorization is terminated or revoked sooner.  Performed at [facility], [address] [place], [place] [address]   Blood Culture ID Panel (Reflexed)     Status: Abnormal   Collection Time: 09/29/19  8:26 PM  Result Value Ref Range Status   Enterococcus faecalis NOT DETECTED NOT DETECTED Final   Enterococcus Faecium NOT DETECTED NOT DETECTED Final   Listeria monocytogenes NOT DETECTED NOT DETECTED Final   Staphylococcus species DETECTED (A) NOT DETECTED Final    Comment: RESULT CALLED TO, READ BACK BY AND VERIFIED WITH: Harland German PHARMD @1955  09/30/19 EB    Staphylococcus aureus (BCID) NOT DETECTED NOT DETECTED Final   Staphylococcus epidermidis DETECTED (A) NOT DETECTED Final    Comment: Methicillin (oxacillin) resistant coagulase negative staphylococcus. Possible blood culture contaminant (unless isolated from more than one blood culture draw or clinical case suggests pathogenicity). No antibiotic treatment is indicated for blood  culture contaminants. RESULT CALLED TO, READ BACK BY AND VERIFIED WITH: ANDREW MEYER PHARMD @1955  09/30/19 EB    Staphylococcus  lugdunensis NOT DETECTED NOT DETECTED Final   Streptococcus species NOT DETECTED NOT DETECTED Final   Streptococcus agalactiae NOT DETECTED NOT DETECTED Final   Streptococcus pneumoniae NOT DETECTED NOT DETECTED Final   Streptococcus pyogenes NOT DETECTED NOT DETECTED Final   A.calcoaceticus-baumannii NOT DETECTED NOT DETECTED Final   Bacteroides fragilis NOT DETECTED NOT DETECTED Final   Enterobacterales NOT DETECTED NOT DETECTED Final   Enterobacter cloacae complex NOT DETECTED NOT DETECTED Final   Escherichia coli NOT DETECTED NOT DETECTED Final   Klebsiella aerogenes NOT DETECTED NOT DETECTED Final   Klebsiella oxytoca NOT DETECTED NOT DETECTED Final   Klebsiella pneumoniae NOT DETECTED NOT DETECTED Final   Proteus species NOT DETECTED NOT DETECTED Final   Salmonella species NOT DETECTED NOT DETECTED Final   Serratia marcescens NOT DETECTED NOT DETECTED Final   Haemophilus influenzae NOT DETECTED NOT DETECTED Final   Neisseria meningitidis NOT DETECTED NOT DETECTED Final   Pseudomonas aeruginosa NOT DETECTED NOT DETECTED Final   Stenotrophomonas maltophilia NOT DETECTED NOT DETECTED Final   Candida albicans NOT DETECTED NOT DETECTED Final   Candida auris NOT DETECTED NOT DETECTED Final   Candida glabrata NOT DETECTED NOT DETECTED Final   Candida krusei NOT DETECTED NOT DETECTED Final   Candida parapsilosis NOT DETECTED NOT DETECTED Final   Candida tropicalis NOT DETECTED NOT DETECTED Final   Cryptococcus neoformans/gattii NOT DETECTED NOT DETECTED Final   Methicillin resistance mecA/C DETECTED (A) NOT DETECTED Final    Comment: RESULT CALLED TO, READ BACK BY AND VERIFIED WITH: PHARMD @1955  09/30/19 EB Performed at [facility], [address], [place], [facility] [address]   Blood Culture (routine x 2)     Status: None   Collection Time: 09/29/19  8:50 PM   Specimen: BLOOD RIGHT  HAND  Result Value Ref Range Status   Specimen Description BLOOD RIGHT HAND  Final    Special Requests   Final    BOTTLES DRAWN AEROBIC ONLY Blood Culture results may not be optimal due to an inadequate volume of blood received in culture bottles   Culture   Final    NO GROWTH 5 DAYS Performed at [facility], [address] [place], [place] [address]    Report Status 10/04/2019 FINAL  Final  Urine culture     Status: Abnormal   Collection Time: 09/30/19 11:24 PM   Specimen: In/Out Cath Urine  Result Value Ref Range Status   Specimen Description IN/OUT CATH URINE  Final   Special Requests   Final    NONE Performed at [facility], [address], [place], [place] [address]    Culture MULTIPLE SPECIES PRESENT, SUGGEST RECOLLECTION (A)  Final   Report Status 10/04/2019 FINAL  Final         Radiology Studies: No results found.      Scheduled Meds: . vitamin C  500 mg Oral Daily  . metoprolol tartrate  50 mg Oral BID  . zinc sulfate  220 mg Oral Daily   Continuous Infusions: . dextrose 5 % and 0.45% NaCl 100 mL/hr at 10/05/19 1110     LOS: 6 days    Time spent: 30 minutes    Dorcas Carrow, MD [facility] Pager [phone]

## 2019-10-05 NOTE — TOC Progression Note (Addendum)
Transition of Care [facility]) - Progression Note    Patient Details  Name: Finnigan Warriner MRN: [id number] Date of Birth: Nov 06, 1926  Transition of Care [facility]) CM/SW Contact  Mearl Latin, LCSW Phone Number: 10/05/2019, 8:48 AM  Clinical Narrative:    CSW requested PT see patient again for insurance and requested [name] obtain authorization. Patient's daughter updated.      Barriers to Discharge: Continued Medical Work up, [employer], English as a second language teacher, Transportation, SNF Pending bed offer  Expected Discharge Plan and Services   In-house Referral: Clinical Social Work, Hospice / Palliative Care   Post Acute Care Choice: Skilled Nursing Facility Living arrangements for the past 2 months: Single Family Home                                       Social Determinants of Health (SDOH) Interventions    Readmission Risk Interventions No flowsheet data found.

## 2019-10-05 NOTE — Discharge Summary (Signed)
Physician Discharge Summary  [name] [name] ZOX:[id number] DOB: [birth year] DOA: 09/29/2019  PCP: Elias Else, MD  Admit date: 09/29/2019 Discharge date: 10/05/2019  Admitted From: Home Disposition: Hospice home  Recommendations for Outpatient Follow-up:  As per hospice plan   Discharge Condition: Fair CODE STATUS: DNR/DNI.  Comfort care Diet recommendation: Comfort feeding  Discharge summary: 86 year old male with history of paroxysmal A. fib on metoprolol, not on anticoagulation because of previous severe bleeding, history of DVT presented to the emergency room for on and off confusion.  Lives at home with daughter.  He was acting off as per the family.  When EMS arrived at home, he was found with A. fib with RVR with heart rate 180.  They started patient on 20 mg of bolus Cardizem and IV fluids and brought to the ER. Patient has been recently with worsening mental status, progressive debility and overall failure to thrive. At the emergency room, he was also found with low blood pressures, dehydration, acute kidney injury.  COVID-19 was positive.  -COVID-19 viral infection in an unvaccinated person: Does not have much pulmonary symptoms.  He was treated symptomatically. -Acute kidney injury: Improved with IV fluids, unable to keep up with oral intake. -Dysphagia/failure to thrive/advanced physical debility: No adequate clinical improvement with medical treatment.  Plan of care: Patient with extensive debility, now with dysphagia and unable to keep up free water intake with hypernatremia.  Sodium 148.  Patient is not able to participate in rehab and has poor outcome. After careful discussion, patient was deemed to be best served with hospice level of care. Patient is being transferred to hospice house today. He can continue taking thyroid medicine and metoprolol as much as he can take it. Patient will be medicated for comfort before transfer to the hospice house.  Patient is stable to  transfer to hospice home on medical transport.  Discharge Diagnoses:  Principal Problem:   COVID-19 virus infection Active Problems:   PVD (peripheral vascular disease) (HCC)   Atrial fibrillation with RVR (HCC)   History of DVT (deep vein thrombosis)   AKI (acute kidney injury) (HCC)   Thrombocytopenia (HCC)   Atrial fibrillation with rapid ventricular response (HCC)   Palliative care by specialist   Goals of care, counseling/discussion   DNR (do not resuscitate)   DNI (do not intubate)   Sepsis with acute renal failure and septic shock (HCC)   Weakness   Encounter for hospice care discussion   Pressure injury of skin    Discharge Instructions  Discharge Instructions    Diet general   Complete by: As directed    Comfort diet   Discharge wound care:   Complete by: As directed    Barrier dressing   Increase activity slowly   Complete by: As directed      Allergies as of 10/05/2019      Reactions   Coumadin [warfarin Sodium] Other (See Comments)   Caused bleeding in brain      Medication List    STOP taking these medications   amiodarone 100 MG tablet Commonly known as: PACERONE   aspirin 81 MG chewable tablet     TAKE these medications   levothyroxine 25 MCG tablet Commonly known as: SYNTHROID Take 25 mcg by mouth daily.   metoprolol tartrate 25 MG tablet Commonly known as: LOPRESSOR Take 25 mg by mouth 2 (two) times daily.   multivitamin with minerals Tabs tablet Take 1 tablet by mouth every evening. [employer]   pantoprazole  40 MG tablet Commonly known as: PROTONIX Take 1 tablet (40 mg total) by mouth daily.   Voltaren 1 % Gel Generic drug: diclofenac Sodium Apply 1 application topically 2 (two) times daily as needed (pain). Apply sparingly.            Discharge Care Instructions  (From admission, onward)         Start     Ordered   10/05/19 0000  Discharge wound care:       Comments: Barrier dressing   10/05/19 1449           Allergies  Allergen Reactions  . Coumadin [Warfarin Sodium] Other (See Comments)    Caused bleeding in brain    Consultations:  Palliative medicine  Hospice   Procedures/Studies: CT Head Wo Contrast  Result Date: 09/29/2019 CLINICAL DATA:  Atrial fibrillation with rapid ventricular response, confusion EXAM: CT HEAD WITHOUT CONTRAST TECHNIQUE: Contiguous axial images were obtained from the base of the skull through the vertex without intravenous contrast. COMPARISON:  07/25/2019 FINDINGS: Brain: Stable chronic small vessel ischemic changes are seen throughout the periventricular white matter and basal ganglia. Stable encephalomalacia a left cerebellar hemisphere. No acute infarct or hemorrhage. Lateral ventricles and midline structures are stable. No acute extra-axial fluid collections. No mass effect. Vascular: No hyperdense vessel or unexpected calcification. Skull: Postsurgical changes from left occipital craniotomy. No acute bony abnormalities. Sinuses/Orbits: No acute finding. Other: None. IMPRESSION: 1. Stable chronic ischemic changes.  No acute intracranial process. Electronically Signed   By: Sharlet Salina M.D.   On: 09/29/2019 21:43   DG Chest Port 1 View  Result Date: 09/29/2019 CLINICAL DATA:  86 year old male with concern for sepsis. EXAM: PORTABLE CHEST 1 VIEW COMPARISON:  Chest radiograph dated 07/24/2019. FINDINGS: Faint left lung base hazy densities, similar to prior radiograph and likely atelectasis. No focal consolidation, pleural effusion, pneumothorax. The cardiac silhouette is within limits. Atherosclerotic calcification of the aorta. No acute osseous pathology. IMPRESSION: No active disease. Electronically Signed   By: Elgie Collard M.D.   On: 09/29/2019 20:17    (Echo, Carotid, EGD, Colonoscopy, ERCP)    Subjective: Patient is seen and examined.  Overnight, nurses noted that he is not able to swallow.  Patient complains of being thirsty.  Pleasant and  confused.   Discharge Exam: Vitals:   10/05/19 1115 10/05/19 1255  BP: (!) 111/57 107/72  Pulse: (!) 101 70  Resp: 20 20  Temp: 97.7 F (36.5 C) 97.9 F (36.6 C)  SpO2: 96% 100%   Vitals:   10/04/19 2000 10/05/19 0511 10/05/19 1115 10/05/19 1255  BP: (!) 101/51 (!) 108/93 (!) 111/57 107/72  Pulse: 83 85 (!) 101 70  Resp: (!) 21 20 20 20   Temp: 97.9 F (36.6 C) 97.6 F (36.4 C) 97.7 F (36.5 C) 97.9 F (36.6 C)  TempSrc: Axillary Axillary Axillary Axillary  SpO2: 94% 99% 96% 100%  Weight:      Height:        General: Alert and awake.  Not oriented.  Pleasantly confused.  Unable to give any complaints or know about the surroundings or situations. Cardiovascular: Irregularly irregular, S1/S2 +, no rubs, no gallops Respiratory: CTA bilaterally, no wheezing, no rhonchi Abdominal: Soft, NT, ND, bowel sounds + Extremities: no edema, no cyanosis    The results of significant diagnostics from this hospitalization (including imaging, microbiology, ancillary and laboratory) are listed below for reference.     Microbiology: Recent Results (from the past 240 hour(s))  Blood  Culture (routine x 2)     Status: Abnormal   Collection Time: 09/29/19  8:26 PM   Specimen: BLOOD  Result Value Ref Range Status   Specimen Description BLOOD RIGHT ANTECUBITAL  Final   Special Requests   Final    BOTTLES DRAWN AEROBIC AND ANAEROBIC Blood Culture adequate volume   Culture  Setup Time   Final    GRAM POSITIVE COCCI IN CLUSTERS ANAEROBIC BOTTLE ONLY Organism ID to follow CRITICAL RESULT CALLED TO, READ BACK BY AND VERIFIED WITH: ANDREW MEYER PHARMD  09/30/19 EB AEROBIC BOTTLE ONLY GRAM POSITIVE COCCI GRAM POSITIVE RODS CRITICAL RESULT CALLED TO, READ BACK BY AND VERIFIED WITH: J [place] PHARMD 10/02/19 0050 JDW    Culture (A)  Final    STAPHYLOCOCCUS EPIDERMIDIS THE SIGNIFICANCE OF ISOLATING THIS ORGANISM FROM A SINGLE SET OF BLOOD CULTURES WHEN MULTIPLE SETS ARE DRAWN IS UNCERTAIN.  PLEASE NOTIFY THE MICROBIOLOGY DEPARTMENT WITHIN ONE WEEK IF SPECIATION AND SENSITIVITIES ARE REQUIRED. Performed at [facility], [address], [place], [place] [address]    Report Status 10/02/2019 FINAL  Final  SARS Coronavirus 2 by RT PCR (hospital order, performed in [facility] hospital lab) Nasopharyngeal Nasopharyngeal Swab     Status: Abnormal   Collection Time: 09/29/19  8:26 PM   Specimen: Nasopharyngeal Swab  Result Value Ref Range Status   SARS Coronavirus 2 POSITIVE (A) NEGATIVE Final    Comment: RESULT CALLED TO, READ BACK BY AND VERIFIED WITH: G KOPP RN 2231 09/29/19 A BROWNING (NOTE) SARS-CoV-2 target nucleic acids are DETECTED  SARS-CoV-2 RNA is generally detectable in upper respiratory specimens  during the acute phase of infection.  Positive results are indicative  of the presence of the identified virus, but do not rule out bacterial infection or co-infection with other pathogens not detected by the test.  Clinical correlation with patient history and  other diagnostic information is necessary to determine patient infection status.  The expected result is negative.  Fact Sheet for Patients:   BoilerBrush.com.cy   Fact Sheet for Healthcare Providers:   https://pope.com/    This test is not yet approved or cleared by the [place] FDA and  has been authorized for detection and/or diagnosis of SARS-CoV-2 by FDA under an Emergency Use Authorization (EUA).  This EUA will remain in effect (meaning this tes t can be used) for the duration of  the COVID-19 declaration under Section 564(b)(1) of the Act, 21 U.S.C. section 360-bbb-3(b)(1), unless the authorization is terminated or revoked sooner.  Performed at [facility], [address] [place], [place] [address]   Blood Culture ID Panel (Reflexed)     Status: Abnormal   Collection Time: 09/29/19  8:26 PM  Result Value Ref Range Status    Enterococcus faecalis NOT DETECTED NOT DETECTED Final   Enterococcus Faecium NOT DETECTED NOT DETECTED Final   Listeria monocytogenes NOT DETECTED NOT DETECTED Final   Staphylococcus species DETECTED (A) NOT DETECTED Final    Comment: RESULT CALLED TO, READ BACK BY AND VERIFIED WITH: Harland German PHARMD  09/30/19 EB    Staphylococcus aureus (BCID) NOT DETECTED NOT DETECTED Final   Staphylococcus epidermidis DETECTED (A) NOT DETECTED Final    Comment: Methicillin (oxacillin) resistant coagulase negative staphylococcus. Possible blood culture contaminant (unless isolated from more than one blood culture draw or clinical case suggests pathogenicity). No antibiotic treatment is indicated for blood  culture contaminants. RESULT CALLED TO, READ BACK BY AND VERIFIED WITH: Harland German PHARMD @  1955 09/30/19 EB    Staphylococcus lugdunensis NOT DETECTED NOT DETECTED Final   Streptococcus species NOT DETECTED NOT DETECTED Final   Streptococcus agalactiae NOT DETECTED NOT DETECTED Final   Streptococcus pneumoniae NOT DETECTED NOT DETECTED Final   Streptococcus pyogenes NOT DETECTED NOT DETECTED Final   A.calcoaceticus-baumannii NOT DETECTED NOT DETECTED Final   Bacteroides fragilis NOT DETECTED NOT DETECTED Final   Enterobacterales NOT DETECTED NOT DETECTED Final   Enterobacter cloacae complex NOT DETECTED NOT DETECTED Final   Escherichia coli NOT DETECTED NOT DETECTED Final   Klebsiella aerogenes NOT DETECTED NOT DETECTED Final   Klebsiella oxytoca NOT DETECTED NOT DETECTED Final   Klebsiella pneumoniae NOT DETECTED NOT DETECTED Final   Proteus species NOT DETECTED NOT DETECTED Final   Salmonella species NOT DETECTED NOT DETECTED Final   Serratia marcescens NOT DETECTED NOT DETECTED Final   Haemophilus influenzae NOT DETECTED NOT DETECTED Final   Neisseria meningitidis NOT DETECTED NOT DETECTED Final   Pseudomonas aeruginosa NOT DETECTED NOT DETECTED Final   Stenotrophomonas maltophilia NOT  DETECTED NOT DETECTED Final   Candida albicans NOT DETECTED NOT DETECTED Final   Candida auris NOT DETECTED NOT DETECTED Final   Candida glabrata NOT DETECTED NOT DETECTED Final   Candida krusei NOT DETECTED NOT DETECTED Final   Candida parapsilosis NOT DETECTED NOT DETECTED Final   Candida tropicalis NOT DETECTED NOT DETECTED Final   Cryptococcus neoformans/gattii NOT DETECTED NOT DETECTED Final   Methicillin resistance mecA/C DETECTED (A) NOT DETECTED Final    Comment: RESULT CALLED TO, READ BACK BY AND VERIFIED WITH: Harland German PHARMD @1955  09/30/19 EB Performed at [facility], [address] [place], [place]   Blood Culture (routine x 2)     Status: None   Collection Time: 09/29/19  8:50 PM   Specimen: BLOOD RIGHT HAND  Result Value Ref Range Status   Specimen Description BLOOD RIGHT HAND  Final   Special Requests   Final    BOTTLES DRAWN AEROBIC ONLY Blood Culture results may not be optimal due to an inadequate volume of blood received in culture bottles   Culture   Final    NO GROWTH 5 DAYS Performed at [facility], [address] [place], [place]    Report Status 10/04/2019 FINAL  Final  Urine culture     Status: Abnormal   Collection Time: 09/30/19 11:24 PM   Specimen: In/Out Cath Urine  Result Value Ref Range Status   Specimen Description IN/OUT CATH URINE  Final   Special Requests   Final    NONE Performed at [facility], [address], [place], [place]    Culture MULTIPLE SPECIES PRESENT, SUGGEST RECOLLECTION (A)  Final   Report Status 10/04/2019 FINAL  Final     Labs: BNP (last 3 results) Recent Labs    07/24/19 2228 07/26/19 0707 07/27/19 0533  BNP 144.9* 125.6* 141.9*   Basic Metabolic Panel: Recent Labs  Lab 09/30/19 0338 10/01/19 0816 10/03/19 0821 10/04/19 0814 10/05/19 0421  NA 141 141 147* 147* 148*  K 3.9 4.3 3.7 3.6 3.8  CL 109 110 116* 116* 119*  CO2 21* 22 20* 23 22  GLUCOSE 133*  132* 116* 113* 126*  BUN 43* 40* 30* 26* 27*  CREATININE 1.35* 1.19 0.87 0.86 0.87  CALCIUM 7.4* 7.4* 7.2* 7.2* 7.4*  MG 1.6* 2.1 1.9 1.7 1.7  PHOS 3.2  --   --   --   --  Liver Function Tests: Recent Labs  Lab 09/29/19 1832 09/29/19 1832 09/30/19 0338 10/01/19 0816 10/03/19 0821 10/04/19 0814 10/05/19 0421  AST 128*  --   --  102* 89* 66* 57*  ALT 51*  --   --  43 60* 58* 58*  ALKPHOS 55  --   --  41 53 61 62  BILITOT 0.9  --   --  0.6 0.6 0.5 0.4  PROT 6.9  --   --  5.4* 5.1* 5.0* 4.9*  ALBUMIN 2.5*   < > 2.0* 2.0* 1.9* 1.9* 2.0*   < > = values in this interval not displayed.   No results for input(s): LIPASE, AMYLASE in the last 168 hours. No results for input(s): AMMONIA in the last 168 hours. CBC: Recent Labs  Lab 09/29/19 1832 10/01/19 1033 10/03/19 0821 10/04/19 0814 10/05/19 0421  WBC 9.3 6.2 7.1 7.5 8.5  NEUTROABS  --   --  5.8 6.1 7.0  HGB 15.2 12.1* 12.1* 11.6* 12.2*  HCT 48.7 38.7* 38.0* 37.9* 37.3*  MCV 86.8 86.2 85.0 86.5 85.6  PLT 79* 65* 65* 74* 71*   Cardiac Enzymes: No results for input(s): CKTOTAL, CKMB, CKMBINDEX, TROPONINI in the last 168 hours. BNP: Invalid input(s): POCBNP CBG: Recent Labs  Lab 09/29/19 2304  GLUCAP 127*   D-Dimer Recent Labs    10/04/19 0814 10/05/19 0421  DDIMER 9.40* 10.13*   Hgb A1c No results for input(s): HGBA1C in the last 72 hours. Lipid Profile No results for input(s): CHOL, HDL, LDLCALC, TRIG, CHOLHDL, LDLDIRECT in the last 72 hours. Thyroid function studies No results for input(s): TSH, T4TOTAL, T3FREE, THYROIDAB in the last 72 hours.  Invalid input(s): FREET3 Anemia work up Recent Labs    10/04/19 0814 10/05/19 0421  FERRITIN 1,307* 1,354*   Urinalysis    Component Value Date/Time   COLORURINE AMBER (A) 07/25/2019 0602   APPEARANCEUR HAZY (A) 07/25/2019 0602   LABSPEC 1.024 07/25/2019 0602   PHURINE 5.0 07/25/2019 0602   GLUCOSEU NEGATIVE 07/25/2019 0602   HGBUR LARGE (A) 07/25/2019  0602   BILIRUBINUR NEGATIVE 07/25/2019 0602   KETONESUR NEGATIVE 07/25/2019 0602   PROTEINUR 100 (A) 07/25/2019 0602   UROBILINOGEN 0.2 05/30/2014 1358   NITRITE NEGATIVE 07/25/2019 0602   LEUKOCYTESUR MODERATE (A) 07/25/2019 0602   Sepsis Labs Invalid input(s): PROCALCITONIN,  WBC,  LACTICIDVEN Microbiology Recent Results (from the past 240 hour(s))  Blood Culture (routine x 2)     Status: Abnormal   Collection Time: 09/29/19  8:26 PM   Specimen: BLOOD  Result Value Ref Range Status   Specimen Description BLOOD RIGHT ANTECUBITAL  Final   Special Requests   Final    BOTTLES DRAWN AEROBIC AND ANAEROBIC Blood Culture adequate volume   Culture  Setup Time   Final    GRAM POSITIVE COCCI IN CLUSTERS ANAEROBIC BOTTLE ONLY Organism ID to follow CRITICAL RESULT CALLED TO, READ BACK BY AND VERIFIED WITH: ANDREW MEYER PHARMD @1955  09/30/19 EB AEROBIC BOTTLE ONLY GRAM POSITIVE COCCI GRAM POSITIVE RODS CRITICAL RESULT CALLED TO, READ BACK BY AND VERIFIED WITH: J [place] PHARMD 10/02/19 0050 JDW    Culture (A)  Final    STAPHYLOCOCCUS EPIDERMIDIS THE SIGNIFICANCE OF ISOLATING THIS ORGANISM FROM A SINGLE SET OF BLOOD CULTURES WHEN MULTIPLE SETS ARE DRAWN IS UNCERTAIN. PLEASE NOTIFY THE MICROBIOLOGY DEPARTMENT WITHIN ONE WEEK IF SPECIATION AND SENSITIVITIES ARE REQUIRED. Performed at [facility], [address] [place], [place] [address]    Report Status  10/02/2019 FINAL  Final  SARS Coronavirus 2 by RT PCR (hospital order, performed in [facility] hospital lab) Nasopharyngeal Nasopharyngeal Swab     Status: Abnormal   Collection Time: 09/29/19  8:26 PM   Specimen: Nasopharyngeal Swab  Result Value Ref Range Status   SARS Coronavirus 2 POSITIVE (A) NEGATIVE Final    Comment: RESULT CALLED TO, READ BACK BY AND VERIFIED WITH: G KOPP RN 2231 09/29/19 A BROWNING (NOTE) SARS-CoV-2 target nucleic acids are DETECTED  SARS-CoV-2 RNA is generally detectable in upper respiratory specimens   during the acute phase of infection.  Positive results are indicative  of the presence of the identified virus, but do not rule out bacterial infection or co-infection with other pathogens not detected by the test.  Clinical correlation with patient history and  other diagnostic information is necessary to determine patient infection status.  The expected result is negative.  Fact Sheet for Patients:   BoilerBrush.com.cyhttps://www.fda.gov/media/136312/download   Fact Sheet for Healthcare Providers:   https://pope.com/https://www.fda.gov/media/136313/download    This test is not yet approved or cleared by the [place] FDA and  has been authorized for detection and/or diagnosis of SARS-CoV-2 by FDA under an Emergency Use Authorization (EUA).  This EUA will remain in effect (meaning this tes t can be used) for the duration of  the COVID-19 declaration under Section 564(b)(1) of the Act, 21 U.S.C. section 360-bbb-3(b)(1), unless the authorization is terminated or revoked sooner.  Performed at [facility], [address] [place], [place] [id number]   Blood Culture ID Panel (Reflexed)     Status: Abnormal   Collection Time: 09/29/19  8:26 PM  Result Value Ref Range Status   Enterococcus faecalis NOT DETECTED NOT DETECTED Final   Enterococcus Faecium NOT DETECTED NOT DETECTED Final   Listeria monocytogenes NOT DETECTED NOT DETECTED Final   Staphylococcus species DETECTED (A) NOT DETECTED Final    Comment: RESULT CALLED TO, READ BACK BY AND VERIFIED WITH: Harland GermanNDREW MEYER PHARMD @1955  09/30/19 EB    Staphylococcus aureus (BCID) NOT DETECTED NOT DETECTED Final   Staphylococcus epidermidis DETECTED (A) NOT DETECTED Final    Comment: Methicillin (oxacillin) resistant coagulase negative staphylococcus. Possible blood culture contaminant (unless isolated from more than one blood culture draw or clinical case suggests pathogenicity). No antibiotic treatment is indicated for blood  culture contaminants. RESULT CALLED  TO, READ BACK BY AND VERIFIED WITH: Harland GermanNDREW MEYER PHARMD @1955  09/30/19 EB    Staphylococcus lugdunensis NOT DETECTED NOT DETECTED Final   Streptococcus species NOT DETECTED NOT DETECTED Final   Streptococcus agalactiae NOT DETECTED NOT DETECTED Final   Streptococcus pneumoniae NOT DETECTED NOT DETECTED Final   Streptococcus pyogenes NOT DETECTED NOT DETECTED Final   A.calcoaceticus-baumannii NOT DETECTED NOT DETECTED Final   Bacteroides fragilis NOT DETECTED NOT DETECTED Final   Enterobacterales NOT DETECTED NOT DETECTED Final   Enterobacter cloacae complex NOT DETECTED NOT DETECTED Final   Escherichia coli NOT DETECTED NOT DETECTED Final   Klebsiella aerogenes NOT DETECTED NOT DETECTED Final   Klebsiella oxytoca NOT DETECTED NOT DETECTED Final   Klebsiella pneumoniae NOT DETECTED NOT DETECTED Final   Proteus species NOT DETECTED NOT DETECTED Final   Salmonella species NOT DETECTED NOT DETECTED Final   Serratia marcescens NOT DETECTED NOT DETECTED Final   Haemophilus influenzae NOT DETECTED NOT DETECTED Final   Neisseria meningitidis NOT DETECTED NOT DETECTED Final   Pseudomonas aeruginosa NOT DETECTED NOT DETECTED Final   Stenotrophomonas maltophilia NOT DETECTED NOT DETECTED Final  Candida albicans NOT DETECTED NOT DETECTED Final   Candida auris NOT DETECTED NOT DETECTED Final   Candida glabrata NOT DETECTED NOT DETECTED Final   Candida krusei NOT DETECTED NOT DETECTED Final   Candida parapsilosis NOT DETECTED NOT DETECTED Final   Candida tropicalis NOT DETECTED NOT DETECTED Final   Cryptococcus neoformans/gattii NOT DETECTED NOT DETECTED Final   Methicillin resistance mecA/C DETECTED (A) NOT DETECTED Final    Comment: RESULT CALLED TO, READ BACK BY AND VERIFIED WITH: Harland German PHARMD @1955  09/30/19 EB Performed at [facility], [address] [place], [place]   Blood Culture (routine x 2)     Status: None   Collection Time: 09/29/19  8:50 PM   Specimen:  BLOOD RIGHT HAND  Result Value Ref Range Status   Specimen Description BLOOD RIGHT HAND  Final   Special Requests   Final    BOTTLES DRAWN AEROBIC ONLY Blood Culture results may not be optimal due to an inadequate volume of blood received in culture bottles   Culture   Final    NO GROWTH 5 DAYS Performed at [facility], [address], [place], [place]    Report Status 10/04/2019 FINAL  Final  Urine culture     Status: Abnormal   Collection Time: 09/30/19 11:24 PM   Specimen: In/Out Cath Urine  Result Value Ref Range Status   Specimen Description IN/OUT CATH URINE  Final   Special Requests   Final    NONE Performed at [facility], [address], [place], [place]    Culture MULTIPLE SPECIES PRESENT, SUGGEST RECOLLECTION (A)  Final   Report Status 10/04/2019 FINAL  Final     Time coordinating discharge:  35 minutes  SIGNED:   10/06/2019, MD  [facility] 10/05/2019, 2:50 PM

## 2019-10-05 NOTE — Progress Notes (Signed)
[facility]: [name] [name]  Spoke to the pt's daughter Johnny Bridge over phone and discussed goals of care for her dad. We also discussed the hospice house services agreement and the transition to comfort care at the [facility]. She confirms he is a DNR and is in agreement with comfort care. Spoke to my MD at [facility] house to discuss pt and she agrees pt is appropriate for the inpatient facility. The daughter did accept bed offered. She will sign paperwork by e-mail. Once this is received back the we can proceed with transfer by ambulance.   Report number for pt's RN to call is [phone].  Thank you for this referral. Norm Parcel RN

## 2019-10-05 NOTE — TOC Transition Note (Addendum)
Transition of Care [facility]) - CM/SW Discharge Note   Patient Details  Name: [name] [name] MRN: [id number] Date of Birth: [birth year]  Transition of Care [facility]) CM/SW Contact:  Mearl Latin, LCSW Phone Number: 10/05/2019, 3:01 PM   Clinical Narrative:    Patient will DC to: [facility] Anticipated DC date: 10/05/19 Family notified: Daughter, [name] Transport by: Sharin Mons 4pm  Please let patient's daughter know once transport arrives.    Per MD patient ready for DC to Hospice. RN to call report prior to discharge [phone]). RN, patient, patient's family, and facility notified of DC. Discharge Summary sent to facility. DC packet on chart. Ambulance transport requested for patient.   CSW will sign off for now as social work intervention is no longer needed. Please consult [place] again if new needs arise.      Final next level of care: Hospice Medical Facility Barriers to Discharge: Barriers Resolved   Patient Goals and CMS Choice Patient states their goals for this hospitalization and ongoing recovery are:: Rehab CMS Medicare.gov Compare Post Acute Care list provided to:: Patient Represenative (must comment) (Daughter) Choice offered to / list presented to : Adult Children  Discharge Placement                Patient to be transferred to facility by: [facility] Name of family member notified: Daughter, [name] Patient and family notified of of transfer: 10/05/19  Discharge Plan and Services In-house Referral: Clinical Social Work, Hospice / Palliative Care   Post Acute Care Choice: Skilled Nursing Facility                               Social Determinants of Health (SDOH) Interventions     Readmission Risk Interventions No flowsheet data found.

## 2019-10-05 NOTE — TOC Progression Note (Signed)
Transition of Care [facility]) - Progression Note    Patient Details  Name: Burnice Oestreicher MRN: [id number] Date of Birth: [birth year]  Transition of Care [facility]) CM/SW Contact  Mearl Latin, LCSW Phone Number: 10/05/2019, 11:38 AM  Clinical Narrative:    CSW received consult from MD stating residential hospice is now more appropriate for patient. CSW spoke with patient's daughter again. She reports agreement with hospice locally and understands that [facility], is the only available Hospice accepting COVID patients. CSW sent referral for review.      Barriers to Discharge: Continued Medical Work up, [employer], English as a second language teacher, Transportation, SNF Pending bed offer  Expected Discharge Plan and Services   In-house Referral: Clinical Social Work, Hospice / Palliative Care   Post Acute Care Choice: Skilled Nursing Facility Living arrangements for the past 2 months: Single Family Home                                       Social Determinants of Health (SDOH) Interventions    Readmission Risk Interventions No flowsheet data found.

## 2019-10-05 NOTE — Progress Notes (Signed)
Civil engineer, contracting [facility]) Community Based Palliative Care       This patient is enrolled in our palliative care services in the community.    [facility] was contacted about hospice services for this patient last week but the plan for him was unclear at that time. If needed, he can transition from our Palliative program to our Hospice in facilities program.   [facility] will continue to follow for any discharge planning needs and to coordinate continuation of palliative care.    If you have questions or need assistance, please call [phone] or contact the hospital Liaison listed on AMION.        Elsie Saas, RN, [facility]  [facility] Liaison   [phone]

## 2019-10-14 DEATH — deceased

## 2019-12-04 ENCOUNTER — Ambulatory Visit: Payer: Medicare HMO | Admitting: Podiatry

## 2020-02-01 ENCOUNTER — Ambulatory Visit: Payer: Medicare HMO | Admitting: Cardiovascular Disease

## 2020-02-08 ENCOUNTER — Ambulatory Visit: Payer: Medicare HMO | Admitting: Cardiology

## 2021-11-24 IMAGING — DX DG CHEST 1V PORT
1 series · 1 of 1 positions shown · non-contrast
Comparison: 05/30/2014

CLINICAL DATA: Atrial fibrillation

EXAM:
PORTABLE CHEST 1 VIEW

[chest]
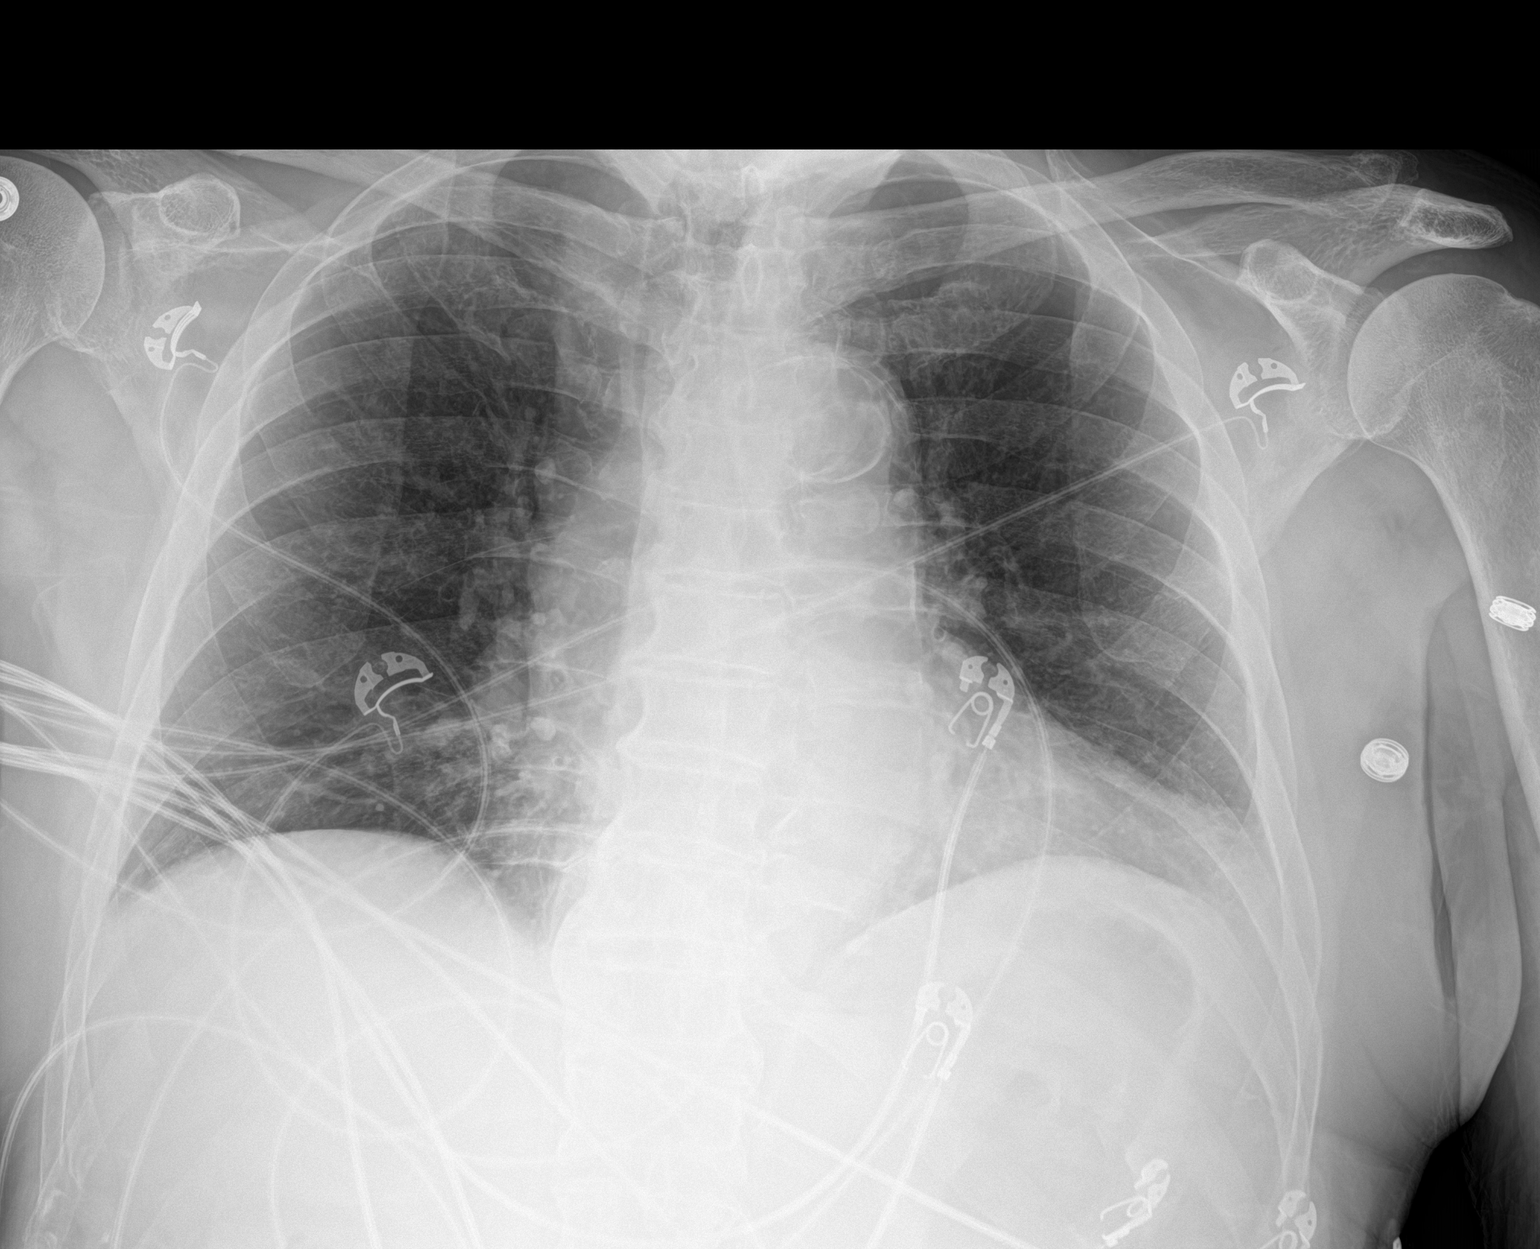

[1 of 1 positions shown; findings below may reference images not displayed]

FINDINGS: Cardiac shadow remains enlarged. Aortic calcifications are again
seen. The lungs are well aerated bilaterally. Minimal atelectatic
changes are noted in the left lateral lung base. No bony abnormality
is seen.
IMPRESSION: Minimal left basilar atelectasis. No other focal abnormality is
noted.
